# Patient Record
Sex: Female | Born: 1990
Health system: Southern US, Community
[De-identification: ages and names within clinical notes are randomized; demographics above are authoritative.]

## PROBLEM LIST (undated history)

## (undated) ENCOUNTER — Inpatient Hospital Stay: Payer: Self-pay

## (undated) ENCOUNTER — Inpatient Hospital Stay (HOSPITAL_COMMUNITY): Payer: Self-pay

## (undated) DIAGNOSIS — J45909 Unspecified asthma, uncomplicated: Secondary | ICD-10-CM

## (undated) DIAGNOSIS — D649 Anemia, unspecified: Secondary | ICD-10-CM

## (undated) HISTORY — PX: WISDOM TOOTH EXTRACTION: SHX21

## (undated) HISTORY — DX: Unspecified asthma, uncomplicated: J45.909

## (undated) HISTORY — PX: OTHER SURGICAL HISTORY: SHX169

---
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
## 2016-05-02 LAB — HM PAP SMEAR: HM Pap smear: NEGATIVE

## 2017-01-13 ENCOUNTER — Ambulatory Visit (INDEPENDENT_AMBULATORY_CARE_PROVIDER_SITE_OTHER): Payer: Self-pay | Admitting: Certified Nurse Midwife

## 2017-01-13 ENCOUNTER — Other Ambulatory Visit (INDEPENDENT_AMBULATORY_CARE_PROVIDER_SITE_OTHER): Payer: Medicaid Other

## 2017-01-13 ENCOUNTER — Other Ambulatory Visit: Payer: Self-pay | Admitting: Certified Nurse Midwife

## 2017-01-13 VITALS — BP 93/66 | HR 79 | Ht 60.0 in | Wt 223.6 lb

## 2017-01-13 DIAGNOSIS — Z8742 Personal history of other diseases of the female genital tract: Secondary | ICD-10-CM

## 2017-01-13 DIAGNOSIS — Z113 Encounter for screening for infections with a predominantly sexual mode of transmission: Secondary | ICD-10-CM

## 2017-01-13 DIAGNOSIS — Z3401 Encounter for supervision of normal first pregnancy, first trimester: Secondary | ICD-10-CM

## 2017-01-13 DIAGNOSIS — Z1389 Encounter for screening for other disorder: Secondary | ICD-10-CM

## 2017-01-13 NOTE — Progress Notes (Signed)
Natalie Beck presents for NOB nurse interview visit. Pregnancy confirmation done at [facility] on 12/19/2016. UPT-positive. LMP: 11/07/2016 (exact). Does have history of irregular menses. No vaginal spotting or significant pain. Ultrasound for dating and viability ordered.   G-1.  P-0000. Pregnancy education material explained and given. No cats in the home. NOB labs ordered. TSH/HbgA1c due to Increased BMI of 43.  HIV labs were explained optional and she did not decline. Drug screen declined. Pt states she has been around it. PNV encouraged. Will take Vitamin B6,  3x day for nausea and Unisom if needed. Genetic screening options discussed. Genetic testing: Unsure.  Pt may discuss with provider. Pt. To follow up with provider on 02/04/2017 for NOB physical.  All questions answered.

## 2017-01-13 NOTE — Patient Instructions (Signed)
Pregnancy and Zika Virus Disease Zika virus disease, or Zika, is an illness that can spread to people from mosquitoes that carry the virus. It may also spread from person to person through infected body fluids. Zika first occurred in [place], but recently it has spread to new areas. The virus occurs in tropical climates. The location of Zika continues to change. Most people who become infected with Zika virus do not develop serious illness. However, Zika may cause birth defects in an unborn baby whose mother is infected with the virus. It may also increase the risk of miscarriage. What are the symptoms of Zika virus disease? In many cases, people who have been infected with Zika virus do not develop any symptoms. If symptoms appear, they usually start about a week after the person is infected. Symptoms are usually mild. They may include:  Fever.  Rash.  Red eyes.  Joint pain.  How does Zika virus disease spread? The main way that Zika virus spreads is through the bite of a certain type of mosquito. Unlike most types of mosquitos, which bite only at night, the type of mosquito that carries Zika virus bites both at night and during the day. Zika virus can also spread through sexual contact, through a blood transfusion, and from a mother to her baby before or during birth. Once you have had Zika virus disease, it is unlikely that you will get it again. Can I pass Zika to my baby during pregnancy? Yes, Zika can pass from a mother to her baby before or during birth. What problems can Zika cause for my baby? A woman who is infected with Zika virus while pregnant is at risk of having her baby born with a condition in which the brain or head is smaller than expected (microcephaly). Babies who have microcephaly can have developmental delays, seizures, hearing problems, and vision problems. Having Zika virus disease during pregnancy can also increase the risk of miscarriage. How can Zika virus disease be  prevented? There is no vaccine to prevent Zika. The best way to prevent the disease is to avoid infected mosquitoes and avoid exposure to body fluids that can spread the virus. Avoid any possible exposure to Zika by taking the following precautions. For women and their sex partners:  Avoid traveling to high-risk areas. The locations where Zika is being reported change often. To identify high-risk areas, check the CDC travel website: www.cdc.gov/zika/geo/index.html  If you or your sex partner must travel to a high-risk area, talk with a health care provider before and after traveling.  Take all precautions to avoid mosquito bites if you live in, or travel to, any of the high-risk areas. Insect repellents are safe to use during pregnancy.  Ask your health care provider when it is safe to have sexual contact.  For women:  If you are pregnant or trying to become pregnant, avoid sexual contact with persons who may have been exposed to Zika virus, persons who have possible symptoms of Zika, or persons whose history you are unsure about. If you choose to have sexual contact with someone who may have been exposed to Zika virus, use condoms correctly during the entire duration of sexual activity, every time. Do not share sexual devices, as you may be exposed to body fluids.  Ask your health care provider about when it is safe to attempt pregnancy after a possible exposure to Zika virus.  What steps should I take to avoid mosquito bites? Take these steps to avoid mosquito bites   when you are in a high-risk area:  Wear loose clothing that covers your arms and legs.  Limit your outdoor activities.  Do not open windows unless they have window screens.  Sleep under mosquito nets.  Use insect repellent. The best insect repellents have:  DEET, picaridin, oil of lemon eucalyptus (OLE), or IR3535 in them.  Higher amounts of an active ingredient in them.  Remember that insect repellents are safe to  use during pregnancy.  Do not use OLE on children who are younger than 3 years of age. Do not use insect repellent on babies who are younger than 2 months of age.  Cover your child's stroller with mosquito netting. Make sure the netting fits snugly and that any loose netting does not cover your child's mouth or nose. Do not use a blanket as a mosquito-protection cover.  Do not apply insect repellent underneath clothing.  If you are using sunscreen, apply the sunscreen before applying the insect repellent.  Treat clothing with permethrin. Do not apply permethrin directly to your skin. Follow label directions for safe use.  Get rid of standing water, where mosquitoes may reproduce. Standing water is often found in items such as buckets, bowls, animal food dishes, and flowerpots.  When you return from traveling to any high-risk area, continue taking actions to protect yourself against mosquito bites for 3 weeks, even if you show no signs of illness. This will prevent spreading Zika virus to uninfected mosquitoes. What should I know about the sexual transmission of Zika? People can spread Zika to their sexual partners during vaginal, anal, or oral sex, or by sharing sexual devices. Many people with Zika do not develop symptoms, so a person could spread the disease without knowing that they are infected. The greatest risk is to women who are pregnant or who may become pregnant. Zika virus can live longer in semen than it can live in blood. Couples can prevent sexual transmission of the virus by:  Using condoms correctly during the entire duration of sexual activity, every time. This includes vaginal, anal, and oral sex.  Not sharing sexual devices. Sharing increases your risk of being exposed to body fluid from another person.  Avoiding all sexual activity until your health care provider says it is safe.  Should I be tested for Zika virus? A sample of your blood can be tested for Zika virus. A  pregnant woman should be tested if she may have been exposed to the virus or if she has symptoms of Zika. She may also have additional tests done during her pregnancy, such ultrasound testing. Talk with your health care provider about which tests are recommended. This information is not intended to replace advice given to you by your health care provider. Make sure you discuss any questions you have with your health care provider. Document Released: 12/28/2014 Document Revised: 09/14/2015 Document Reviewed: 12/21/2014 Elsevier Interactive Patient Education  2018 [employer] Hyperemesis Gravidarum Hyperemesis gravidarum is a severe form of nausea and vomiting that happens during pregnancy. Hyperemesis is worse than morning sickness. It may cause you to have nausea or vomiting all day for many days. It may keep you from eating and drinking enough food and liquids. Hyperemesis usually occurs during the first half (the first 20 weeks) of pregnancy. It often goes away once a woman is in her second half of pregnancy. However, sometimes hyperemesis continues through an entire pregnancy. What are the causes? The cause of this condition is not known. It may be related   to changes in chemicals (hormones) in the body during pregnancy, such as the high level of pregnancy hormone (human chorionic gonadotropin) or the increase in the female sex hormone (estrogen). What are the signs or symptoms? Symptoms of this condition include:  Severe nausea and vomiting.  Nausea that does not go away.  Vomiting that does not allow you to keep any food down.  Weight loss.  Body fluid loss (dehydration).  Having no desire to eat, or not liking food that you have previously enjoyed.  How is this diagnosed? This condition may be diagnosed based on:  A physical exam.  Your medical history.  Your symptoms.  Blood tests.  Urine tests.  How is this treated? This condition may be managed with medicine. If  medicines to do not help relieve nausea and vomiting, you may need to receive fluids through an IV tube at the hospital. Follow these instructions at home:  Take over-the-counter and prescription medicines only as told by your health care provider.  Avoid iron pills and multivitamins that contain iron for the first 3-4 months of pregnancy. If you take prescription iron pills, do not stop taking them unless your health care provider approves.  Take the following actions to help prevent nausea and vomiting: ? In the morning, before getting out of bed, try eating a couple of dry crackers or a piece of toast. ? Avoid foods and smells that upset your stomach. Fatty and spicy foods may make nausea worse. ? Eat 5-6 small meals a day. ? Do not drink fluids while eating meals. Drink between meals. ? Eat or suck on things that have ginger in them. Ginger can help relieve nausea. ? Avoid food preparation. The smell of food can spoil your appetite or trigger nausea.  Follow instructions from your health care provider about eating or drinking restrictions.  For snacks, eat high-protein foods, such as cheese.  Keep all follow-up and pre-birth (prenatal) visits as told by your health care provider. This is important. Contact a health care provider if:  You have pain in your abdomen.  You have a severe headache.  You have vision problems.  You are losing weight. Get help right away if:  You cannot drink fluids without vomiting.  You vomit blood.  You have constant nausea and vomiting.  You are very weak.  You are very thirsty.  You feel dizzy.  You faint.  You have a fever or other symptoms that last for more than 2-3 days.  You have a fever and your symptoms suddenly get worse. Summary  Hyperemesis gravidarum is a severe form of nausea and vomiting that happens during pregnancy.  Making some changes to your eating habits may help relieve nausea and vomiting.  This condition may  be managed with medicine.  If medicines to do not help relieve nausea and vomiting, you may need to receive fluids through an IV tube at the hospital. This information is not intended to replace advice given to you by your health care provider. Make sure you discuss any questions you have with your health care provider. Document Released: 04/08/2005 Document Revised: 12/06/2015 Document Reviewed: 12/06/2015 Elsevier Interactive Patient Education  2017 [employer] First Trimester of Pregnancy The first trimester of pregnancy is from week 1 until the end of week 13 (months 1 through 3). During this time, your baby will begin to develop inside you. At 6-8 weeks, the eyes and face are formed, and the heartbeat can be seen on ultrasound. At the   end of 12 weeks, all the baby's organs are formed. Prenatal care is all the medical care you receive before the birth of your baby. Make sure you get good prenatal care and follow all of your doctor's instructions. Follow these instructions at home: Medicines  Take over-the-counter and prescription medicines only as told by your doctor. Some medicines are safe and some medicines are not safe during pregnancy.  Take a prenatal vitamin that contains at least 600 micrograms (mcg) of folic acid.  If you have trouble pooping (constipation), take medicine that will make your stool soft (stool softener) if your doctor approves. Eating and drinking  Eat regular, healthy meals.  Your doctor will tell you the amount of weight gain that is right for you.  Avoid raw meat and uncooked cheese.  If you feel sick to your stomach (nauseous) or throw up (vomit): ? Eat 4 or 5 small meals a day instead of 3 large meals. ? Try eating a few soda crackers. ? Drink liquids between meals instead of during meals.  To prevent constipation: ? Eat foods that are high in fiber, like fresh fruits and vegetables, whole grains, and beans. ? Drink enough fluids to keep your pee  (urine) clear or pale yellow. Activity  Exercise only as told by your doctor. Stop exercising if you have cramps or pain in your lower belly (abdomen) or low back.  Do not exercise if it is too hot, too humid, or if you are in a place of great height (high altitude).  Try to avoid standing for long periods of time. Move your legs often if you must stand in one place for a long time.  Avoid heavy lifting.  Wear low-heeled shoes. Sit and stand up straight.  You can have sex unless your doctor tells you not to. Relieving pain and discomfort  Wear a good support bra if your breasts are sore.  Take warm water baths (sitz baths) to soothe pain or discomfort caused by hemorrhoids. Use hemorrhoid cream if your doctor says it is okay.  Rest with your legs raised if you have leg cramps or low back pain.  If you have puffy, bulging veins (varicose veins) in your legs: ? Wear support hose or compression stockings as told by your doctor. ? Raise (elevate) your feet for 15 minutes, 3-4 times a day. ? Limit salt in your food. Prenatal care  Schedule your prenatal visits by the twelfth week of pregnancy.  Write down your questions. Take them to your prenatal visits.  Keep all your prenatal visits as told by your doctor. This is important. Safety  Wear your seat belt at all times when driving.  Make a list of emergency phone numbers. The list should include numbers for family, friends, the hospital, and police and fire departments. General instructions  Ask your doctor for a referral to a local prenatal class. Begin classes no later than at the start of month 6 of your pregnancy.  Ask for help if you need counseling or if you need help with nutrition. Your doctor can give you advice or tell you where to go for help.  Do not use hot tubs, steam rooms, or saunas.  Do not douche or use tampons or scented sanitary pads.  Do not cross your legs for long periods of time.  Avoid all herbs  and alcohol. Avoid drugs that are not approved by your doctor.  Do not use any tobacco products, including cigarettes, chewing tobacco, and electronic cigarettes.   If you need help quitting, ask your doctor. You may get counseling or other support to help you quit.  Avoid cat litter boxes and soil used by cats. These carry germs that can cause birth defects in the baby and can cause a loss of your baby (miscarriage) or stillbirth.  Visit your dentist. At home, brush your teeth with a soft toothbrush. Be gentle when you floss. Contact a doctor if:  You are dizzy.  You have mild cramps or pressure in your lower belly.  You have a nagging pain in your belly area.  You continue to feel sick to your stomach, you throw up, or you have watery poop (diarrhea).  You have a bad smelling fluid coming from your vagina.  You have pain when you pee (urinate).  You have increased puffiness (swelling) in your face, hands, legs, or ankles. Get help right away if:  You have a fever.  You are leaking fluid from your vagina.  You have spotting or bleeding from your vagina.  You have very bad belly cramping or pain.  You gain or lose weight rapidly.  You throw up blood. It may look like coffee grounds.  You are around people who have German measles, fifth disease, or chickenpox.  You have a very bad headache.  You have shortness of breath.  You have any kind of trauma, such as from a fall or a car accident. Summary  The first trimester of pregnancy is from week 1 until the end of week 13 (months 1 through 3).  To take care of yourself and your unborn baby, you will need to eat healthy meals, take medicines only if your doctor tells you to do so, and do activities that are safe for you and your baby.  Keep all follow-up visits as told by your doctor. This is important as your doctor will have to ensure that your baby is healthy and growing well. This information is not intended to replace  advice given to you by your health care provider. Make sure you discuss any questions you have with your health care provider. Document Released: 09/25/2007 Document Revised: 04/16/2016 Document Reviewed: 04/16/2016 Elsevier Interactive Patient Education  2017 [employer] Commonly Asked Questions During Pregnancy  Cats: A parasite can be excreted in cat feces.  To avoid exposure you need to have another person empty the little box.  If you must empty the litter box you will need to wear gloves.  Wash your hands after handling your cat.  This parasite can also be found in raw or undercooked meat so this should also be avoided.  Colds, Sore Throats, Flu: Please check your medication sheet to see what you can take for symptoms.  If your symptoms are unrelieved by these medications please call the office.  Dental Work: Most any dental work your dentist recommends is permitted.  X-rays should only be taken during the first trimester if absolutely necessary.  Your abdomen should be shielded with a lead apron during all x-rays.  Please notify your provider prior to receiving any x-rays.  Novocaine is fine; gas is not recommended.  If your dentist requires a note from us prior to dental work please call the office and we will provide one for you.  Exercise: Exercise is an important part of staying healthy during your pregnancy.  You may continue most exercises you were accustomed to prior to pregnancy.  Later in your pregnancy you will most likely notice you have difficulty with activities   requiring balance like riding a bicycle.  It is important that you listen to your body and avoid activities that put you at a higher risk of falling.  Adequate rest and staying well hydrated are a must!  If you have questions about the safety of specific activities ask your provider.    Exposure to Children with illness: Try to avoid obvious exposure; report any symptoms to us when noted,  If you have chicken pos, red  measles or mumps, you should be immune to these diseases.   Please do not take any vaccines while pregnant unless you have checked with your OB provider.  Fetal Movement: After 28 weeks we recommend you do "kick counts" twice daily.  Lie or sit down in a calm quiet environment and count your baby movements "kicks".  You should feel your baby at least 10 times per hour.  If you have not felt 10 kicks within the first hour get up, walk around and have something sweet to eat or drink then repeat for an additional hour.  If count remains less than 10 per hour notify your provider.  Fumigating: Follow your pest control agent's advice as to how long to stay out of your home.  Ventilate the area well before re-entering.  Hemorrhoids:   Most over-the-counter preparations can be used during pregnancy.  Check your medication to see what is safe to use.  It is important to use a stool softener or fiber in your diet and to drink lots of liquids.  If hemorrhoids seem to be getting worse please call the office.   Hot Tubs:  Hot tubs Jacuzzis and saunas are not recommended while pregnant.  These increase your internal body temperature and should be avoided.  Intercourse:  Sexual intercourse is safe during pregnancy as long as you are comfortable, unless otherwise advised by your provider.  Spotting may occur after intercourse; report any bright red bleeding that is heavier than spotting.  Labor:  If you know that you are in labor, please go to the hospital.  If you are unsure, please call the office and let us help you decide what to do.  Lifting, straining, etc:  If your job requires heavy lifting or straining please check with your provider for any limitations.  Generally, you should not lift items heavier than that you can lift simply with your hands and arms (no back muscles)  Painting:  Paint fumes do not harm your pregnancy, but may make you ill and should be avoided if possible.  Latex or water based paints  have less odor than oils.  Use adequate ventilation while painting.  Permanents & Hair Color:  Chemicals in hair dyes are not recommended as they cause increase hair dryness which can increase hair loss during pregnancy.  " Highlighting" and permanents are allowed.  Dye may be absorbed differently and permanents may not hold as well during pregnancy.  Sunbathing:  Use a sunscreen, as skin burns easily during pregnancy.  Drink plenty of fluids; avoid over heating.  Tanning Beds:  Because their possible side effects are still unknown, tanning beds are not recommended.  Ultrasound Scans:  Routine ultrasounds are performed at approximately 20 weeks.  You will be able to see your baby's general anatomy an if you would like to know the gender this can usually be determined as well.  If it is questionable when you conceived you may also receive an ultrasound early in your pregnancy for dating purposes.  Otherwise ultrasound exams   are not routinely performed unless there is a medical necessity.  Although you can request a scan we ask that you pay for it when conducted because insurance does not cover " patient request" scans.  Work: If your pregnancy proceeds without complications you may work until your due date, unless your physician or employer advises otherwise.  Round Ligament Pain/Pelvic Discomfort:  Sharp, shooting pains not associated with bleeding are fairly common, usually occurring in the second trimester of pregnancy.  They tend to be worse when standing up or when you remain standing for long periods of time.  These are the result of pressure of certain pelvic ligaments called "round ligaments".  Rest, Tylenol and heat seem to be the most effective relief.  As the womb and fetus grow, they rise out of the pelvis and the discomfort improves.  Please notify the office if your pain seems different than that described.  It may represent a more serious condition.   

## 2017-01-14 LAB — CBC WITH DIFFERENTIAL/PLATELET
Basophils Absolute: 0 10*3/uL (ref 0.0–0.2)
Basos: 0 %
EOS (ABSOLUTE): 0.1 10*3/uL (ref 0.0–0.4)
Eos: 1 %
Hematocrit: 34.4 % (ref 34.0–46.6)
Hemoglobin: 10.7 g/dL — ABNORMAL LOW (ref 11.1–15.9)
Immature Grans (Abs): 0 10*3/uL (ref 0.0–0.1)
Immature Granulocytes: 0 %
Lymphocytes Absolute: 3.2 10*3/uL — ABNORMAL HIGH (ref 0.7–3.1)
Lymphs: 33 %
MCH: 25.2 pg — ABNORMAL LOW (ref 26.6–33.0)
MCHC: 31.1 g/dL — ABNORMAL LOW (ref 31.5–35.7)
MCV: 81 fL (ref 79–97)
Monocytes Absolute: 0.6 10*3/uL (ref 0.1–0.9)
Monocytes: 7 %
Neutrophils Absolute: 5.7 10*3/uL (ref 1.4–7.0)
Neutrophils: 59 %
Platelets: 265 10*3/uL (ref 150–379)
RBC: 4.24 x10E6/uL (ref 3.77–5.28)
RDW: 18 % — ABNORMAL HIGH (ref 12.3–15.4)
WBC: 9.7 10*3/uL (ref 3.4–10.8)

## 2017-01-14 LAB — URINALYSIS, ROUTINE W REFLEX MICROSCOPIC
Bilirubin, UA: NEGATIVE
Glucose, UA: NEGATIVE
Ketones, UA: NEGATIVE
Nitrite, UA: NEGATIVE
PH UA: 6 (ref 5.0–7.5)
PROTEIN UA: NEGATIVE
RBC, UA: NEGATIVE
Specific Gravity, UA: 1.022 (ref 1.005–1.030)
Urobilinogen, Ur: 0.2 mg/dL (ref 0.2–1.0)

## 2017-01-14 LAB — VARICELLA ZOSTER ANTIBODY, IGG: VARICELLA: 1307 {index} (ref >165)

## 2017-01-14 LAB — HEPATITIS B SURFACE ANTIGEN: Hepatitis B Surface Ag: NEGATIVE

## 2017-01-14 LAB — MICROSCOPIC EXAMINATION: Casts: NONE SEEN /lpf

## 2017-01-14 LAB — RUBELLA SCREEN: Rubella Antibodies, IGG: 4.27 index (ref >0.99)

## 2017-01-14 LAB — ABO

## 2017-01-14 LAB — TSH: TSH: 1.79 u[IU]/mL (ref 0.450–4.500)

## 2017-01-14 LAB — RPR: RPR: NONREACTIVE

## 2017-01-14 LAB — ANTIBODY SCREEN: Antibody Screen: NEGATIVE

## 2017-01-14 LAB — SICKLE CELL SCREEN: Sickle Cell Screen: NEGATIVE

## 2017-01-14 LAB — RH TYPE: RH TYPE: POSITIVE

## 2017-01-14 LAB — HEMOGLOBIN A1C
ESTIMATED AVERAGE GLUCOSE: 108 mg/dL
Hgb A1c MFr Bld: 5.4 % (ref 4.8–5.6)

## 2017-01-14 LAB — HIV ANTIBODY (ROUTINE TESTING W REFLEX): HIV Screen 4th Generation wRfx: NONREACTIVE

## 2017-01-17 ENCOUNTER — Telehealth: Payer: Self-pay

## 2017-01-17 NOTE — Telephone Encounter (Signed)
-----   Message from Gunnar Bulla, CNM sent at 01/17/2017  2:25 PM EDT ----- Please contact patient. H/H low, needs oral iron supplementation daily. May take 325 mg PO ferrous gluconate or ferrous sulfate. Otherwise labs normal for pregnancy. Encourage to activate MyChart. Thanks, JML

## 2017-01-17 NOTE — Telephone Encounter (Signed)
Message left on voicemail of providers instructions and encouraged her to set up mychart on next visit.

## 2017-01-17 NOTE — Progress Notes (Signed)
Please contact patient. H/H low, needs oral iron supplementation daily. May take 325 mg PO ferrous gluconate or ferrous sulfate. Otherwise labs normal for pregnancy. Encourage to activate MyChart. Thanks, JML

## 2017-01-18 LAB — URINE CULTURE, OB REFLEX

## 2017-01-18 LAB — CULTURE, OB URINE

## 2017-01-20 ENCOUNTER — Encounter: Payer: Self-pay | Admitting: Certified Nurse Midwife

## 2017-01-20 DIAGNOSIS — B951 Streptococcus, group B, as the cause of diseases classified elsewhere: Secondary | ICD-10-CM | POA: Insufficient documentation

## 2017-01-20 LAB — GC/CHLAMYDIA PROBE AMP
Chlamydia trachomatis, NAA: NEGATIVE
Neisseria gonorrhoeae by PCR: NEGATIVE

## 2017-02-04 ENCOUNTER — Ambulatory Visit (INDEPENDENT_AMBULATORY_CARE_PROVIDER_SITE_OTHER): Payer: Medicaid Other | Admitting: Certified Nurse Midwife

## 2017-02-04 VITALS — BP 122/69 | HR 81 | Wt 222.5 lb

## 2017-02-04 DIAGNOSIS — Z3492 Encounter for supervision of normal pregnancy, unspecified, second trimester: Secondary | ICD-10-CM

## 2017-02-04 DIAGNOSIS — Z6841 Body Mass Index (BMI) 40.0 and over, adult: Secondary | ICD-10-CM

## 2017-02-04 DIAGNOSIS — B951 Streptococcus, group B, as the cause of diseases classified elsewhere: Secondary | ICD-10-CM

## 2017-02-04 LAB — POCT URINALYSIS DIPSTICK
Bilirubin, UA: NEGATIVE
Blood, UA: NEGATIVE
GLUCOSE UA: NEGATIVE
Ketones, UA: NEGATIVE
LEUKOCYTES UA: NEGATIVE
NITRITE UA: NEGATIVE
SPEC GRAV UA: 1.025 (ref 1.010–1.025)
UROBILINOGEN UA: 0.2 U/dL
pH, UA: 6 (ref 5.0–8.0)

## 2017-02-04 MED ORDER — TERCONAZOLE 0.4 % VA CREA
1.0000 | TOPICAL_CREAM | Freq: Every day | VAGINAL | 0 refills | Status: DC
Start: 2017-02-04 — End: 2017-03-05

## 2017-02-04 NOTE — Patient Instructions (Signed)
Eating Plan for Pregnant Women While you are pregnant, your body will require additional nutrition to help support your growing baby. It is recommended that you consume:  150 additional calories each day during your first trimester.  300 additional calories each day during your second trimester.  300 additional calories each day during your third trimester.  Eating a healthy, well-balanced diet is very important for your health and for your baby's health. You also have a higher need for some vitamins and minerals, such as folic acid, calcium, iron, and vitamin D. What do I need to know about eating during pregnancy?  Do not try to lose weight or go on a diet during pregnancy.  Choose healthy, nutritious foods. Choose  of a sandwich with a glass of milk instead of a candy bar or a high-calorie sugar-sweetened beverage.  Limit your overall intake of foods that have "empty calories." These are foods that have little nutritional value, such as sweets, desserts, candies, sugar-sweetened beverages, and fried foods.  Eat a variety of foods, especially fruits and vegetables.  Take a prenatal vitamin to help meet the additional needs during pregnancy, specifically for folic acid, iron, calcium, and vitamin D.  Remember to stay active. Ask your health care provider for exercise recommendations that are specific to you.  Practice good food safety and cleanliness, such as washing your hands before you eat and after you prepare raw meat. This helps to prevent foodborne illnesses, such as listeriosis, that can be very dangerous for your baby. Ask your health care provider for more information about listeriosis. What does 150 extra calories look like? Healthy options for an additional 150 calories each day could be any of the following:  Plain low-fat yogurt (6-8 oz) with  cup of berries.  1 apple with 2 teaspoons of peanut butter.  Cut-up vegetables with  cup of hummus.  Low-fat chocolate milk  (8 oz or 1 cup).  1 string cheese with 1 medium orange.   of a peanut butter and jelly sandwich on whole-wheat bread (1 tsp of peanut butter).  For 300 calories, you could eat two of those healthy options each day. What is a healthy amount of weight to gain? The recommended amount of weight for you to gain is based on your pre-pregnancy BMI. If your pre-pregnancy BMI was:  Less than 18 (underweight), you should gain 28-40 lb.  18-24.9 (normal), you should gain 25-35 lb.  25-29.9 (overweight), you should gain 15-25 lb.  Greater than 30 (obese), you should gain 11-20 lb.  What if I am having twins or multiples? Generally, pregnant women who will be having twins or multiples may need to increase their daily calories by 300-600 calories each day. The recommended range for total weight gain is 25-54 lb, depending on your pre-pregnancy BMI. Talk with your health care provider for specific guidance about additional nutritional needs, weight gain, and exercise during your pregnancy. What foods can I eat? Grains Any grains. Try to choose whole grains, such as whole-wheat bread, oatmeal, or brown rice. Vegetables Any vegetables. Try to eat a variety of colors and types of vegetables to get a full range of vitamins and minerals. Remember to wash your vegetables well before eating. Fruits Any fruits. Try to eat a variety of colors and types of fruit to get a full range of vitamins and minerals. Remember to wash your fruits well before eating. Meats and Other Protein Sources Lean meats, including chicken, [place], fish, and lean cuts of beef, veal,  or pork. Make sure that all meats are cooked to "well done." Tofu. Tempeh. Beans. Eggs. Peanut butter and other nut butters. Seafood, such as shrimp, crab, and lobster. If you choose fish, select types that are higher in omega-3 fatty acids, including salmon, herring, mussels, trout, sardines, and pollock. Make sure that all meats are cooked to food-safe  temperatures. Dairy Pasteurized milk and milk alternatives. Pasteurized yogurt and pasteurized cheese. Cottage cheese. Sour cream. Beverages Water. Juices that contain 100% fruit juice or vegetable juice. Caffeine-free teas and decaffeinated coffee. Drinks that contain caffeine are okay to drink, but it is better to avoid caffeine. Keep your total caffeine intake to less than 200 mg each day (12 oz of coffee, tea, or soda) or as directed by your health care provider. Condiments Any pasteurized condiments. Sweets and Desserts Any sweets and desserts. Fats and Oils Any fats and oils. The items listed above may not be a complete list of recommended foods or beverages. Contact your dietitian for more options. What foods are not recommended? Vegetables Unpasteurized (raw) vegetable juices. Fruits Unpasteurized (raw) fruit juices. Meats and Other Protein Sources Cured meats that have nitrates, such as bacon, salami, and hotdogs. Luncheon meats, bologna, or other deli meats (unless they are reheated until they are steaming hot). Refrigerated pate, meat spreads from a meat counter, smoked seafood that is found in the refrigerated section of a store. Raw fish, such as sushi or sashimi. High mercury content fish, such as tilefish, shark, swordfish, and king mackerel. Raw meats, such as tuna or beef tartare. Undercooked meats and poultry. Make sure that all meats are cooked to food-safe temperatures. Dairy Unpasteurized (raw) milk and any foods that have raw milk in them. Soft cheeses, such as feta, queso blanco, queso fresco, Brie, Camembert cheeses, blue-veined cheeses, and Panela cheese (unless it is made with pasteurized milk, which must be stated on the label). Beverages Alcohol. Sugar-sweetened beverages, such as sodas, teas, or energy drinks. Condiments Homemade fermented foods and drinks, such as pickles, sauerkraut, or kombucha drinks. (Store-bought pasteurized versions of these are  okay.) Other Salads that are made in the store, such as ham salad, chicken salad, egg salad, tuna salad, and seafood salad. The items listed above may not be a complete list of foods and beverages to avoid. Contact your dietitian for more information. This information is not intended to replace advice given to you by your health care provider. Make sure you discuss any questions you have with your health care provider. Document Released: 01/21/2014 Document Revised: 09/14/2015 Document Reviewed: 09/21/2013 Elsevier Interactive Patient Education  2018 [employer]. Morning Sickness Morning sickness is when you feel sick to your stomach (nauseous) during pregnancy. You may feel sick to your stomach and throw up (vomit). You may feel sick in the morning, but you can feel this way any time of day. Some women feel very sick to their stomach and cannot stop throwing up (hyperemesis gravidarum). Follow these instructions at home:  Only take medicines as told by your doctor.  Take multivitamins as told by your doctor. Taking multivitamins before getting pregnant can stop or lessen the harshness of morning sickness.  Eat dry toast or unsalted crackers before getting out of bed.  Eat 5 to 6 small meals a day.  Eat dry and bland foods like rice and baked potatoes.  Do not drink liquids with meals. Drink between meals.  Do not eat greasy, fatty, or spicy foods.  Have someone cook for you if the smell  of food causes you to feel sick or throw up.  If you feel sick to your stomach after taking prenatal vitamins, take them at night or with a snack.  Eat protein when you need a snack (nuts, yogurt, cheese).  Eat unsweetened gelatins for dessert.  Wear a bracelet used for sea sickness (acupressure wristband).  Go to a doctor that puts thin needles into certain body points (acupuncture) to improve how you feel.  Do not smoke.  Use a humidifier to keep the air in your house free of odors.  Get  lots of fresh air. Contact a doctor if:  You need medicine to feel better.  You feel dizzy or lightheaded.  You are losing weight. Get help right away if:  You feel very sick to your stomach and cannot stop throwing up.  You pass out (faint). This information is not intended to replace advice given to you by your health care provider. Make sure you discuss any questions you have with your health care provider. Document Released: 05/16/2004 Document Revised: 09/14/2015 Document Reviewed: 09/23/2012 Elsevier Interactive Patient Education  2017 [employer]. Common Medications Safe in Pregnancy  Acne:      Constipation:  Benzoyl Peroxide     Colace  Clindamycin      Dulcolax Suppository  Topica Erythromycin     Fibercon  Salicylic Acid      Metamucil         Miralax AVOID:        Senakot   Accutane    Cough:  Retin-A       Cough Drops  Tetracycline      Phenergan w/ Codeine if Rx  Minocycline      Robitussin (Plain & DM)  Antibiotics:     Crabs/Lice:  Ceclor       RID  Cephalosporins    AVOID:  E-Mycins      Kwell  Keflex  Macrobid/Macrodantin   Diarrhea:  Penicillin      Kao-Pectate  Zithromax      Imodium AD         PUSH FLUIDS AVOID:       Cipro     Fever:  Tetracycline      Tylenol (Regular or Extra  Minocycline       Strength)  Levaquin      Extra Strength-Do not          Exceed 8 tabs/24 hrs Caffeine:        <281m/day (equiv. To 1 cup of coffee or  approx. 3 12 oz sodas)         Gas: Cold/Hayfever:       Gas-X  Benadryl      Mylicon  Claritin       Phazyme  **Claritin-D        Chlor-Trimeton    Headaches:  Dimetapp      ASA-Free Excedrin  Drixoral-Non-Drowsy     Cold Compress  Mucinex (Guaifenasin)     Tylenol (Regular or Extra  Sudafed/Sudafed-12 Hour     Strength)  **Sudafed PE Pseudoephedrine   Tylenol Cold & Sinus     Vicks Vapor Rub  Zyrtec  **AVOID if Problems With Blood Pressure         Heartburn: Avoid lying down for at least 1 hour  after meals  Aciphex      Maalox     Rash:  Milk of Magnesia     Benadryl    Mylanta  1% Hydrocortisone Cream  Pepcid  Pepcid Complete   Sleep Aids:  Prevacid      Ambien   Prilosec       Benadryl  Rolaids       Chamomile Tea  Tums (Limit 4/day)     Unisom  Zantac       Tylenol PM         Warm milk-add vanilla or  Hemorrhoids:       Sugar for taste  Anusol/Anusol H.C.  (RX: Analapram 2.5%)  Sugar Substitutes:  Hydrocortisone OTC     Ok in moderation  Preparation H      Tucks        Vaseline lotion applied to tissue with wiping    Herpes:     Throat:  Acyclovir      Oragel  Famvir  Valtrex     Vaccines:         Flu Shot Leg Cramps:       *Gardasil  Benadryl      Hepatitis A         Hepatitis B Nasal Spray:       Pneumovax  Saline Nasal Spray     Polio Booster         Tetanus Nausea:       Tuberculosis test or PPD  Vitamin B6 25 mg TID   AVOID:    Dramamine      *Gardasil  Emetrol       Live Poliovirus  Ginger Root 250 mg QID    MMR (measles, mumps &  High Complex Carbs @ Bedtime    rebella)  Sea Bands-Accupressure    Varicella (Chickenpox)  Unisom 1/2 tab TID     *No known complications           If received before Pain:         Known pregnancy;   Darvocet       Resume series after  Lortab        Delivery  Percocet    Yeast:   Tramadol      Femstat  Tylenol 3      Gyne-lotrimin  Ultram       Monistat  Vicodin           MISC:         All Sunscreens           Hair Coloring/highlights          Insect Repellant's          (Including DEET)         Mystic Tans Second Trimester of Pregnancy The second trimester is from week 14 through week 27 (months 4 through 6). The second trimester is often a time when you feel your best. Your body has adjusted to being pregnant, and you begin to feel better physically. Usually, morning sickness has lessened or quit completely, you may have more energy, and you may have an increase in appetite. The second trimester is also a  time when the fetus is growing rapidly. At the end of the sixth month, the fetus is about 9 inches long and weighs about 1 pounds. You will likely begin to feel the baby move (quickening) between 16 and 20 weeks of pregnancy. Body changes during your second trimester Your body continues to go through many changes during your second trimester. The changes vary from woman to woman.  Your weight will continue to increase. You will notice your lower abdomen bulging  out.  You may begin to get stretch marks on your hips, abdomen, and breasts.  You may develop headaches that can be relieved by medicines. The medicines should be approved by your health care provider.  You may urinate more often because the fetus is pressing on your bladder.  You may develop or continue to have heartburn as a result of your pregnancy.  You may develop constipation because certain hormones are causing the muscles that push waste through your intestines to slow down.  You may develop hemorrhoids or swollen, bulging veins (varicose veins).  You may have back pain. This is caused by: ? Weight gain. ? Pregnancy hormones that are relaxing the joints in your pelvis. ? A shift in weight and the muscles that support your balance.  Your breasts will continue to grow and they will continue to become tender.  Your gums may bleed and may be sensitive to brushing and flossing.  Dark spots or blotches (chloasma, mask of pregnancy) may develop on your face. This will likely fade after the baby is born.  A dark line from your belly button to the pubic area (linea nigra) may appear. This will likely fade after the baby is born.  You may have changes in your hair. These can include thickening of your hair, rapid growth, and changes in texture. Some women also have hair loss during or after pregnancy, or hair that feels dry or thin. Your hair will most likely return to normal after your baby is born.  What to expect at prenatal  visits During a routine prenatal visit:  You will be weighed to make sure you and the fetus are growing normally.  Your blood pressure will be taken.  Your abdomen will be measured to track your baby's growth.  The fetal heartbeat will be listened to.  Any test results from the previous visit will be discussed.  Your health care provider may ask you:  How you are feeling.  If you are feeling the baby move.  If you have had any abnormal symptoms, such as leaking fluid, bleeding, severe headaches, or abdominal cramping.  If you are using any tobacco products, including cigarettes, chewing tobacco, and electronic cigarettes.  If you have any questions.  Other tests that may be performed during your second trimester include:  Blood tests that check for: ? Low iron levels (anemia). ? High blood sugar that affects pregnant women (gestational diabetes) between 42 and 28 weeks. ? Rh antibodies. This is to check for a protein on red blood cells (Rh factor).  Urine tests to check for infections, diabetes, or protein in the urine.  An ultrasound to confirm the proper growth and development of the baby.  An amniocentesis to check for possible genetic problems.  Fetal screens for spina bifida and Down syndrome.  HIV (human immunodeficiency virus) testing. Routine prenatal testing includes screening for HIV, unless you choose not to have this test.  Follow these instructions at home: Medicines  Follow your health care provider's instructions regarding medicine use. Specific medicines may be either safe or unsafe to take during pregnancy.  Take a prenatal vitamin that contains at least 600 micrograms (mcg) of folic acid.  If you develop constipation, try taking a stool softener if your health care provider approves. Eating and drinking  Eat a balanced diet that includes fresh fruits and vegetables, whole grains, good sources of protein such as meat, eggs, or tofu, and low-fat  dairy. Your health care provider will help you  determine the amount of weight gain that is right for you.  Avoid raw meat and uncooked cheese. These carry germs that can cause birth defects in the baby.  If you have low calcium intake from food, talk to your health care provider about whether you should take a daily calcium supplement.  Limit foods that are high in fat and processed sugars, such as fried and sweet foods.  To prevent constipation: ? Drink enough fluid to keep your urine clear or pale yellow. ? Eat foods that are high in fiber, such as fresh fruits and vegetables, whole grains, and beans. Activity  Exercise only as directed by your health care provider. Most women can continue their usual exercise routine during pregnancy. Try to exercise for 30 minutes at least 5 days a week. Stop exercising if you experience uterine contractions.  Avoid heavy lifting, wear low heel shoes, and practice good posture.  A sexual relationship may be continued unless your health care provider directs you otherwise. Relieving pain and discomfort  Wear a good support bra to prevent discomfort from breast tenderness.  Take warm sitz baths to soothe any pain or discomfort caused by hemorrhoids. Use hemorrhoid cream if your health care provider approves.  Rest with your legs elevated if you have leg cramps or low back pain.  If you develop varicose veins, wear support hose. Elevate your feet for 15 minutes, 3-4 times a day. Limit salt in your diet. Prenatal Care  Write down your questions. Take them to your prenatal visits.  Keep all your prenatal visits as told by your health care provider. This is important. Safety  Wear your seat belt at all times when driving.  Make a list of emergency phone numbers, including numbers for family, friends, the hospital, and police and fire departments. General instructions  Ask your health care provider for a referral to a local prenatal education  class. Begin classes no later than the beginning of month 6 of your pregnancy.  Ask for help if you have counseling or nutritional needs during pregnancy. Your health care provider can offer advice or refer you to specialists for help with various needs.  Do not use hot tubs, steam rooms, or saunas.  Do not douche or use tampons or scented sanitary pads.  Do not cross your legs for long periods of time.  Avoid cat litter boxes and soil used by cats. These carry germs that can cause birth defects in the baby and possibly loss of the fetus by miscarriage or stillbirth.  Avoid all smoking, herbs, alcohol, and unprescribed drugs. Chemicals in these products can affect the formation and growth of the baby.  Do not use any products that contain nicotine or tobacco, such as cigarettes and e-cigarettes. If you need help quitting, ask your health care provider.  Visit your dentist if you have not gone yet during your pregnancy. Use a soft toothbrush to brush your teeth and be gentle when you floss. Contact a health care provider if:  You have dizziness.  You have mild pelvic cramps, pelvic pressure, or nagging pain in the abdominal area.  You have persistent nausea, vomiting, or diarrhea.  You have a bad smelling vaginal discharge.  You have pain when you urinate. Get help right away if:  You have a fever.  You are leaking fluid from your vagina.  You have spotting or bleeding from your vagina.  You have severe abdominal cramping or pain.  You have rapid weight gain or weight loss.  You have shortness of breath with chest pain.  You notice sudden or extreme swelling of your face, hands, ankles, feet, or legs.  You have not felt your baby move in over an hour.  You have severe headaches that do not go away when you take medicine.  You have vision changes. Summary  The second trimester is from week 14 through week 27 (months 4 through 6). It is also a time when the fetus is  growing rapidly.  Your body goes through many changes during pregnancy. The changes vary from woman to woman.  Avoid all smoking, herbs, alcohol, and unprescribed drugs. These chemicals affect the formation and growth your baby.  Do not use any tobacco products, such as cigarettes, chewing tobacco, and e-cigarettes. If you need help quitting, ask your health care provider.  Contact your health care provider if you have any questions. Keep all prenatal visits as told by your health care provider. This is important. This information is not intended to replace advice given to you by your health care provider. Make sure you discuss any questions you have with your health care provider. Document Released: 04/02/2001 Document Revised: 09/14/2015 Document Reviewed: 06/09/2012 Elsevier Interactive Patient Education  2017 [employer].

## 2017-02-04 NOTE — Progress Notes (Signed)
NOB physical- pt is feeling better, energy level is increasing

## 2017-02-04 NOTE — Progress Notes (Signed)
NEW OB HISTORY AND PHYSICAL  SUBJECTIVE:       Natalie Beck is a 26 y.o. G1P0 female, Patient's last menstrual period was 11/07/2016 (exact date)., Estimated Date of Delivery: 08/14/17, [redacted]w[redacted]d, presents today for establishment of Prenatal Care.  She has no unusual complaints and complains of breast tenderness.    Denies difficulty breathing or respiratory distress, chest pain, abdominal pain, vaginal bleeding, dysuria, and leg pain or swelling.   Desires genetic testing, prefers cell free DNA. Would like gender and results called to her mother, Erich Montane. Contact info: [phone] or [phone].     Gynecologic History  Patient's last menstrual period was 11/07/2016 (exact date).   Contraception: none   Last Pap: 2018. Results were: normal  Obstetric History  OB History  Gravida Para Term Preterm AB Living  1            SAB TAB Ectopic Multiple Live Births               # Outcome Date GA Lbr Len/2nd Weight Sex Delivery Anes PTL Lv  1 Current               Past Medical History:  Diagnosis Date  . Asthma     Past Surgical History:  Procedure Laterality Date  . dental implant    . WISDOM TOOTH EXTRACTION      Current Outpatient Prescriptions on File Prior to Visit  Medication Sig Dispense Refill  . Prenatal Vit-Fe Fumarate-FA (PRENATAL VITAMINS PLUS PO) Take by mouth.     No current facility-administered medications on file prior to visit.     No Known Allergies  Social History   Social History  . Marital status: Single    Spouse name: N/A  . Number of children: N/A  . Years of education: N/A   Occupational History  . Not on file.   Social History Main Topics  . Smoking status: Former Games developer  . Smokeless tobacco: Never Used  . Alcohol use No     Comment: not since pregnancy  . Drug use: No  . Sexual activity: Yes    Partners: Male    Birth control/ protection: None   Other Topics Concern  . Not on file   Social History Narrative   . No narrative on file    Family History  Problem Relation Age of Onset  . Asthma Father   . Asthma Sister   . Hypertension Sister   . Rheum arthritis Paternal Grandmother   . Cancer Paternal Grandmother        lung  . Cirrhosis Maternal Grandmother   . Cancer Other        maternal great grandmother-leukemia    The following portions of the patient's history were reviewed and updated as appropriate: allergies, current medications, past OB history, past medical history, past surgical history, past family history, past social history, and problem list.    OBJECTIVE:  Initial Physical Exam (New OB)  GENERAL APPEARANCE: alert, well appearing, in no apparent distress  HEAD: normocephalic, atraumatic  MOUTH: mucous membranes moist, pharynx normal without lesions and dental hygiene good  THYROID: no thyromegaly or masses present  BREASTS: not examined  LUNGS: clear to auscultation, no wheezes, rales or rhonchi, symmetric air entry  HEART: regular rate and rhythm, no murmurs  ABDOMEN: soft, nontender, nondistended, no abnormal masses, no epigastric pain, obese and FHT present  EXTREMITIES: no redness or tenderness in the calves or thighs  SKIN: normal coloration and turgor, no rashes  LYMPH NODES: no adenopathy palpable  NEUROLOGIC: alert, oriented, normal speech, no focal findings or movement disorder noted  PELVIC EXAM: not examined  ASSESSMENT: Normal pregnancy Desires genetic screening BMI>40  PLAN: Prenatal care Panorama today Early glucola next visit New OB counseling: The patient has been given an overview regarding routine prenatal care. Recommendations regarding diet, weight gain, and exercise in pregnancy were given. Prenatal testing, optional genetic testing, and ultrasound use in pregnancy were reviewed.  Benefits of Breast Feeding were discussed. The patient is encouraged to consider nursing her baby post partum. See orders   Gunnar Bulla, CNM

## 2017-02-11 ENCOUNTER — Encounter: Payer: Self-pay | Admitting: Certified Nurse Midwife

## 2017-02-17 ENCOUNTER — Telehealth: Payer: Self-pay | Admitting: Certified Nurse Midwife

## 2017-02-17 NOTE — Telephone Encounter (Signed)
Patient would like for someone to call her Mom with the babys gender  DPR signed

## 2017-02-17 NOTE — Telephone Encounter (Signed)
Spoke with mother.

## 2017-02-28 ENCOUNTER — Encounter: Payer: Self-pay | Admitting: Certified Nurse Midwife

## 2017-03-05 ENCOUNTER — Encounter: Payer: Self-pay | Admitting: Certified Nurse Midwife

## 2017-03-05 ENCOUNTER — Other Ambulatory Visit: Payer: Self-pay

## 2017-03-05 ENCOUNTER — Ambulatory Visit (INDEPENDENT_AMBULATORY_CARE_PROVIDER_SITE_OTHER): Payer: Medicaid Other | Admitting: Certified Nurse Midwife

## 2017-03-05 ENCOUNTER — Other Ambulatory Visit: Payer: Medicaid Other

## 2017-03-05 VITALS — BP 90/49 | HR 79 | Wt 225.1 lb

## 2017-03-05 DIAGNOSIS — Z3492 Encounter for supervision of normal pregnancy, unspecified, second trimester: Secondary | ICD-10-CM

## 2017-03-05 DIAGNOSIS — Z6841 Body Mass Index (BMI) 40.0 and over, adult: Secondary | ICD-10-CM

## 2017-03-05 LAB — POCT URINALYSIS DIPSTICK
Bilirubin, UA: NEGATIVE
Glucose, UA: NEGATIVE
Ketones, UA: NEGATIVE
Leukocytes, UA: NEGATIVE
NITRITE UA: NEGATIVE
PH UA: 6 (ref 5.0–8.0)
PROTEIN UA: NEGATIVE
RBC UA: NEGATIVE
SPEC GRAV UA: 1.02 (ref 1.010–1.025)
UROBILINOGEN UA: 0.2 U/dL

## 2017-03-05 NOTE — Progress Notes (Signed)
ROB, doing well. Discussed early GTT and gestational diabetes. Reviewed common muscular discomforts of pregnancy. Recommended use of belly band. Reviewed that she will have anatomy scan with next visit.She verbalizes and agrees to plan of care. Will follow up with 1 hr GTT results . ROB 4 wks.   Doreene BurkeAnnie Danile Trier, CNM

## 2017-03-05 NOTE — Patient Instructions (Signed)

## 2017-03-06 LAB — GLUCOSE, 1 HOUR GESTATIONAL: GESTATIONAL DIABETES SCREEN: 106 mg/dL (ref 65–139)

## 2017-03-31 ENCOUNTER — Encounter: Payer: Medicaid Other | Admitting: Certified Nurse Midwife

## 2017-03-31 ENCOUNTER — Other Ambulatory Visit: Payer: Medicaid Other

## 2017-04-02 ENCOUNTER — Encounter: Payer: Self-pay | Admitting: Certified Nurse Midwife

## 2017-04-03 ENCOUNTER — Ambulatory Visit (INDEPENDENT_AMBULATORY_CARE_PROVIDER_SITE_OTHER): Payer: Medicaid Other | Admitting: Certified Nurse Midwife

## 2017-04-03 ENCOUNTER — Ambulatory Visit (INDEPENDENT_AMBULATORY_CARE_PROVIDER_SITE_OTHER): Payer: Medicaid Other

## 2017-04-03 VITALS — BP 106/65 | HR 77 | Wt 226.7 lb

## 2017-04-03 DIAGNOSIS — Z3689 Encounter for other specified antenatal screening: Secondary | ICD-10-CM

## 2017-04-03 DIAGNOSIS — Z3492 Encounter for supervision of normal pregnancy, unspecified, second trimester: Secondary | ICD-10-CM

## 2017-04-03 LAB — POCT URINALYSIS DIPSTICK
Bilirubin, UA: NEGATIVE
Blood, UA: NEGATIVE
Glucose, UA: NEGATIVE
KETONES UA: NEGATIVE
LEUKOCYTES UA: NEGATIVE
NITRITE UA: NEGATIVE
PH UA: 8 (ref 5.0–8.0)
PROTEIN UA: NEGATIVE
Spec Grav, UA: 1.01 (ref 1.010–1.025)
UROBILINOGEN UA: 0.2 U/dL

## 2017-04-03 NOTE — Progress Notes (Signed)
ROB-Reports bright red bleeding with wiping last night after bowel movement, no active bleeding. Discussed home treatment measures. Anatomy scan today incomplete, findings reviewed with pt and she verbalizes understanding. Reviewed red flag symptoms and when to call. RTC x 2 weeks for follow up anatomy scan. RTC x 4 weeks for ROB with Pattricia BossAnnie or sooner if needed.   ULTRASOUND REPORT  Location: [facility] Date of Service:  04/03/17  Indications: Anatomy Findings:  Mason JimSingleton intrauterine pregnancy is visualized with FHR at 150 BPM. Biometrics give an (U/S) Gestational age of 26 1/7 weeks and an (U/S) EDD of 08/13/17; this correlates with the clinically established EDD of 08/14/17.  Fetal presentation is vertex.  EFW: 411 grams (0lb 15oz). Placenta: Posterior and grade 1.  Placenta is 7.3 cm from cervical os. AFI: WNL subjectively.  Anatomic survey is incomplete.  Views of the fetal heart and nose/lips are needed to complete survey. Gender - Female.   Neither ovary was visualized due to overlying bowel gas. Survey of the adnexa demonstrates no adnexal masses. There is no free peritoneal fluid in the cul de sac.  Impression: 1. 21 1/7 week Viable Singleton Intrauterine pregnancy by U/S. 2. (U/S) EDD is consistent with Clinically established (LMP) EDD of 08/14/17. 3. Incomplete anatomy scan - Views of the fetal heart and nose/lips are still needed.  Recommendations: 1.Clinical correlation with the patient's History and Physical Exam. 2.  F/U U/S recommended in 2 weeks to complete anatomical survey.

## 2017-04-03 NOTE — Patient Instructions (Signed)
Common Medications Safe in Pregnancy  Acne:      Constipation:  Benzoyl Peroxide     Colace  Clindamycin      Dulcolax Suppository  Topica Erythromycin     Fibercon  Salicylic Acid      Metamucil         Miralax AVOID:        Senakot   Accutane    Cough:  Retin-A       Cough Drops  Tetracycline      Phenergan w/ Codeine if Rx  Minocycline      Robitussin (Plain & DM)  Antibiotics:     Crabs/Lice:  Ceclor       RID  Cephalosporins    AVOID:  E-Mycins      Kwell  Keflex  Macrobid/Macrodantin   Diarrhea:  Penicillin      Kao-Pectate  Zithromax      Imodium AD         PUSH FLUIDS AVOID:       Cipro     Fever:  Tetracycline      Tylenol (Regular or Extra  Minocycline       Strength)  Levaquin      Extra Strength-Do not          Exceed 8 tabs/24 hrs Caffeine:        <200mg/day (equiv. To 1 cup of coffee or  approx. 3 12 oz sodas)         Gas: Cold/Hayfever:       Gas-X  Benadryl      Mylicon  Claritin       Phazyme  **Claritin-D        Chlor-Trimeton    Headaches:  Dimetapp      ASA-Free Excedrin  Drixoral-Non-Drowsy     Cold Compress  Mucinex (Guaifenasin)     Tylenol (Regular or Extra  Sudafed/Sudafed-12 Hour     Strength)  **Sudafed PE Pseudoephedrine   Tylenol Cold & Sinus     Vicks Vapor Rub  Zyrtec  **AVOID if Problems With Blood Pressure         Heartburn: Avoid lying down for at least 1 hour after meals  Aciphex      Maalox     Rash:  Milk of Magnesia     Benadryl    Mylanta       1% Hydrocortisone Cream  Pepcid  Pepcid Complete   Sleep Aids:  Prevacid      Ambien   Prilosec       Benadryl  Rolaids       Chamomile Tea  Tums (Limit 4/day)     Unisom  Zantac       Tylenol PM         Warm milk-add vanilla or  Hemorrhoids:       Sugar for taste  Anusol/Anusol H.C.  (RX: Analapram 2.5%)  Sugar Substitutes:  Hydrocortisone OTC     Ok in moderation  Preparation H      Tucks        Vaseline lotion applied to tissue with  wiping    Herpes:     Throat:  Acyclovir      Oragel  Famvir  Valtrex     Vaccines:         Flu Shot Leg Cramps:       *Gardasil  Benadryl      Hepatitis A         Hepatitis B Nasal Spray:         Pneumovax  Saline Nasal Spray     Polio Booster         Tetanus Nausea:       Tuberculosis test or PPD  Vitamin B6 25 mg TID   AVOID:    Dramamine      *Gardasil  Emetrol       Live Poliovirus  Ginger Root 250 mg QID    MMR (measles, mumps &  High Complex Carbs @ Bedtime    rebella)  Sea Bands-Accupressure    Varicella (Chickenpox)  Unisom 1/2 tab TID     *No known complications           If received before Pain:         Known pregnancy;   Darvocet       Resume series after  Lortab        Delivery  Percocet    Yeast:   Tramadol      Femstat  Tylenol 3      Gyne-lotrimin  Ultram       Monistat  Vicodin           MISC:         All Sunscreens           Hair Coloring/highlights          Insect Repellant's          (Including DEET)         Mystic Tans Second Trimester of Pregnancy The second trimester is from week 13 through week 28, month 4 through 6. This is often the time in pregnancy that you feel your best. Often times, morning sickness has lessened or quit. You may have more energy, and you may get hungry more often. Your unborn baby (fetus) is growing rapidly. At the end of the sixth month, he or she is about 9 inches long and weighs about 1 pounds. You will likely feel the baby move (quickening) between 18 and 20 weeks of pregnancy. Follow these instructions at home:  Avoid all smoking, herbs, and alcohol. Avoid drugs not approved by your doctor.  Do not use any tobacco products, including cigarettes, chewing tobacco, and electronic cigarettes. If you need help quitting, ask your doctor. You may get counseling or other support to help you quit.  Only take medicine as told by your doctor. Some medicines are safe and some are not during pregnancy.  Exercise only as told by your  doctor. Stop exercising if you start having cramps.  Eat regular, healthy meals.  Wear a good support bra if your breasts are tender.  Do not use hot tubs, steam rooms, or saunas.  Wear your seat belt when driving.  Avoid raw meat, uncooked cheese, and liter boxes and soil used by cats.  Take your prenatal vitamins.  Take 1500-2000 milligrams of calcium daily starting at the 20th week of pregnancy until you deliver your baby.  Try taking medicine that helps you poop (stool softener) as needed, and if your doctor approves. Eat more fiber by eating fresh fruit, vegetables, and whole grains. Drink enough fluids to keep your pee (urine) clear or pale yellow.  Take warm water baths (sitz baths) to soothe pain or discomfort caused by hemorrhoids. Use hemorrhoid cream if your doctor approves.  If you have puffy, bulging veins (varicose veins), wear support hose. Raise (elevate) your feet for 15 minutes, 3-4 times a day. Limit salt in your diet.  Avoid heavy lifting, wear low heals, and sit up straight.    Rest with your legs raised if you have leg cramps or low back pain.  Visit your dentist if you have not gone during your pregnancy. Use a soft toothbrush to brush your teeth. Be gentle when you floss.  You can have sex (intercourse) unless your doctor tells you not to.  Go to your doctor visits. Get help if:  You feel dizzy.  You have mild cramps or pressure in your lower belly (abdomen).  You have a nagging pain in your belly area.  You continue to feel sick to your stomach (nauseous), throw up (vomit), or have watery poop (diarrhea).  You have bad smelling fluid coming from your vagina.  You have pain with peeing (urination). Get help right away if:  You have a fever.  You are leaking fluid from your vagina.  You have spotting or bleeding from your vagina.  You have severe belly cramping or pain.  You lose or gain weight rapidly.  You have trouble catching your  breath and have chest pain.  You notice sudden or extreme puffiness (swelling) of your face, hands, ankles, feet, or legs.  You have not felt the baby move in over an hour.  You have severe headaches that do not go away with medicine.  You have vision changes. This information is not intended to replace advice given to you by your health care provider. Make sure you discuss any questions you have with your health care provider. Document Released: 07/03/2009 Document Revised: 09/14/2015 Document Reviewed: 06/09/2012 Elsevier Interactive Patient Education  2017 [place]. Round Ligament Pain The round ligament is a cord of muscle and tissue that helps to support the uterus. It can become a source of pain during pregnancy if it becomes stretched or twisted as the baby grows. The pain usually begins in the second trimester of pregnancy, and it can come and go until the baby is delivered. It is not a serious problem, and it does not cause harm to the baby. Round ligament pain is usually a short, sharp, and pinching pain, but it can also be a dull, lingering, and aching pain. The pain is felt in the lower side of the abdomen or in the groin. It usually starts deep in the groin and moves up to the outside of the hip area. Pain can occur with:  A sudden change in position.  Rolling over in bed.  Coughing or sneezing.  Physical activity.  Follow these instructions at home: Watch your condition for any changes. Take these steps to help with your pain:  When the pain starts, relax. Then try: ? Sitting down. ? Flexing your knees up to your abdomen. ? Lying on your side with one pillow under your abdomen and another pillow between your legs. ? Sitting in a warm bath for 15-20 minutes or until the pain goes away.  Take over-the-counter and prescription medicines only as told by your health care provider.  Move slowly when you sit and stand.  Avoid long walks if they cause pain.  Stop or  lessen your physical activities if they cause pain.  Contact a health care provider if:  Your pain does not go away with treatment.  You feel pain in your back that you did not have before.  Your medicine is not helping. Get help right away if:  You develop a fever or chills.  You develop uterine contractions.  You develop vaginal bleeding.  You develop nausea or vomiting.  You develop diarrhea.  You have  pain when you urinate. This information is not intended to replace advice given to you by your health care provider. Make sure you discuss any questions you have with your health care provider. Document Released: 01/16/2008 Document Revised: 09/14/2015 Document Reviewed: 06/15/2014 Elsevier Interactive Patient Education  [employer].

## 2017-04-03 NOTE — Progress Notes (Signed)
ROB- anatomy scan done today, pt states last night she wiped some bright red blood

## 2017-04-17 ENCOUNTER — Ambulatory Visit (INDEPENDENT_AMBULATORY_CARE_PROVIDER_SITE_OTHER): Payer: Medicaid Other

## 2017-04-17 DIAGNOSIS — Z3492 Encounter for supervision of normal pregnancy, unspecified, second trimester: Secondary | ICD-10-CM

## 2017-04-17 DIAGNOSIS — Z3689 Encounter for other specified antenatal screening: Secondary | ICD-10-CM | POA: Diagnosis not present

## 2017-05-02 ENCOUNTER — Encounter: Payer: Self-pay | Admitting: Certified Nurse Midwife

## 2017-05-02 ENCOUNTER — Ambulatory Visit (INDEPENDENT_AMBULATORY_CARE_PROVIDER_SITE_OTHER): Payer: Medicaid Other | Admitting: Certified Nurse Midwife

## 2017-05-02 VITALS — BP 98/54 | HR 79 | Wt 230.0 lb

## 2017-05-02 DIAGNOSIS — Z3492 Encounter for supervision of normal pregnancy, unspecified, second trimester: Secondary | ICD-10-CM

## 2017-05-02 LAB — POCT URINALYSIS DIPSTICK
BILIRUBIN UA: NEGATIVE
Blood, UA: NEGATIVE
GLUCOSE UA: NEGATIVE
KETONES UA: NEGATIVE
Leukocytes, UA: NEGATIVE
Nitrite, UA: NEGATIVE
PH UA: 6.5 (ref 5.0–8.0)
Protein, UA: NEGATIVE
Spec Grav, UA: 1.025 (ref 1.010–1.025)
Urobilinogen, UA: 0.2 E.U./dL

## 2017-05-02 NOTE — Progress Notes (Signed)
ROB- Patient feels well with no complaints.  

## 2017-05-02 NOTE — Patient Instructions (Signed)

## 2017-05-02 NOTE — Progress Notes (Signed)
Body mass index is 44.92 kg/m. ROB, Discussed glucose testing at next visit. Also discussed BMI and need for anesthesia consult. Pt verbalizes understanding and agrees to plan. Order placed.

## 2017-05-16 ENCOUNTER — Encounter: Payer: Self-pay | Admitting: Certified Nurse Midwife

## 2017-05-23 ENCOUNTER — Ambulatory Visit (INDEPENDENT_AMBULATORY_CARE_PROVIDER_SITE_OTHER): Payer: Medicaid Other | Admitting: Certified Nurse Midwife

## 2017-05-23 ENCOUNTER — Other Ambulatory Visit: Payer: Medicaid Other

## 2017-05-23 VITALS — BP 103/68 | HR 78 | Wt 233.8 lb

## 2017-05-23 DIAGNOSIS — Z130 Encounter for screening for diseases of the blood and blood-forming organs and certain disorders involving the immune mechanism: Secondary | ICD-10-CM

## 2017-05-23 DIAGNOSIS — Z3493 Encounter for supervision of normal pregnancy, unspecified, third trimester: Secondary | ICD-10-CM

## 2017-05-23 DIAGNOSIS — Z131 Encounter for screening for diabetes mellitus: Secondary | ICD-10-CM

## 2017-05-23 NOTE — Patient Instructions (Signed)
Pain Relief During Labor and Delivery Many things can cause pain during labor and delivery, including:  Pressure on bones and ligaments due to the baby moving through the pelvis.  Stretching of tissues due to the baby moving through the birth canal.  Muscle tension due to anxiety or nervousness.  The uterus tightening (contracting) and relaxing to help move the baby.  There are many ways to deal with the pain of labor and delivery. They include:  Taking prenatal classes. Taking these classes helps you know what to expect during your baby's birth. What you learn will increase your confidence and decrease your anxiety.  Practicing relaxation techniques or doing relaxing activities, such as: ? Focused breathing. ? Meditation. ? Visualization. ? Aroma therapy. ? Listening to your favorite music. ? Hypnosis.  Taking a warm shower or bath (hydrotherapy). This may: ? Provide comfort and relaxation. ? Lessen your perception of pain. ? Decrease the amount of pain medicine needed. ? Decrease the length of labor.  Getting a massage or counterpressure on your back.  Applying warm packs or ice packs.  Changing positions often, moving around, or using a birthing ball.  Getting: ? Pain medicine through an IV or injection into a muscle. ? Pain medicine inserted into your spinal column. ? Injections of sterile water just under the skin on your lower back (intradermal injections). ? Laughing gas (nitrous oxide).  Discuss your pain control options with your health care provider during your prenatal visits. Explore the options offered by your hospital or birth center. What kinds of medicine are available? There are two kinds of medicines that can be used to relieve pain during labor and delivery:  Analgesics. These medicines decrease pain without causing you to lose feeling or the ability to move your muscles.  Anesthetics. These medicines block feeling in the body and can decrease your  ability to move freely.  Both of these kinds of medicine can cause minor side effects, such as nausea, trouble concentrating, and sleepiness. They can also decrease the baby's heart rate before birth and affect the baby's breathing rate after birth. For this reason, health care providers are careful about when and how much medicine is given. What are specific medicines and procedures that provide pain relief? Local Anesthetics Local anesthetics are used to numb a small area of the body. They may be used along with another kind of anesthetic or used to numb the nerves of the vagina, cervix, and perineum during the second stage of labor. General Anesthetics General anesthetics cause you to lose consciousness so you do not feel pain. They are usually only used for an emergency cesarean delivery. General anesthetics are given through an IV tube and a mask. Pudendal Block A pudendal block is a form of local anesthetic. It may be used to relieve the pain associated with pushing or stretching of the perineum at the time of delivery or to further numb the perineum. A pudendal block is done by injecting numbing medicine through the vaginal wall into a nerve in the pelvis. Epidural Analgesia Epidural analgesia is given through a flexible IV catheter that is inserted into the lower back. Numbing medicine is delivered continuously to the area near your spinal column nerves (epidural space). After having this type of analgesia, you may be able to move your legs but you most likely will not be able to walk. Depending on the amount of medicine given, you may lose all feeling in the lower half of your body, or you may  retain some level of sensation, including the urge to push. Epidural analgesia can be used to provide pain relief for a vaginal birth. Spinal Block A spinal block is similar to epidural analgesia, but the medicine is injected into the spinal fluid instead of the epidural space. A spinal block is only  given once. It starts to relieve pain quickly, but the pain relief lasts only 1-6 hours. Spinal blocks can be used for cesarean deliveries. Combined Spinal-Epidural (CSE) Block A CSE block combines the effects of a spinal block and epidural analgesia. The spinal block works quickly to block all pain. The epidural analgesia provides continuous pain relief, even after the effects of the spinal block have worn off. This information is not intended to replace advice given to you by your health care provider. Make sure you discuss any questions you have with your health care provider. Document Released: 07/25/2008 Document Revised: 09/15/2015 Document Reviewed: 08/30/2015 Elsevier Interactive Patient Education  2018 [place]. Abdominal Pain During Pregnancy Belly (abdominal) pain is common during pregnancy. Most of the time, it is not a serious problem. Other times, it can be a sign that something is wrong with the pregnancy. Always tell your doctor if you have belly pain. Follow these instructions at home: Monitor your belly pain for any changes. The following actions may help you feel better:  Do not have sex (intercourse) or put anything in your vagina until you feel better.  Rest until your pain stops.  Drink clear fluids if you feel sick to your stomach (nauseous). Do not eat solid food until you feel better.  Only take medicine as told by your doctor.  Keep all doctor visits as told.  Get help right away if:  You are bleeding, leaking fluid, or pieces of tissue come out of your vagina.  You have more pain or cramping.  You keep throwing up (vomiting).  You have pain when you pee (urinate) or have blood in your pee.  You have a fever.  You do not feel your baby moving as much.  You feel very weak or feel like passing out.  You have trouble breathing, with or without belly pain.  You have a very bad headache and belly pain.  You have fluid leaking from your vagina and  belly pain.  You keep having watery poop (diarrhea).  Your belly pain does not go away after resting, or the pain gets worse. This information is not intended to replace advice given to you by your health care provider. Make sure you discuss any questions you have with your health care provider. Document Released: 03/27/2009 Document Revised: 11/15/2015 Document Reviewed: 11/05/2012 Elsevier Interactive Patient Education  2018 [place]. Back Pain in Pregnancy Back pain during pregnancy is common. Back pain may be caused by several factors that are related to changes during your pregnancy. Follow these instructions at home: Managing pain, stiffness, and swelling  If directed, apply ice for sudden (acute) back pain. ? Put ice in a plastic bag. ? Place a towel between your skin and the bag. ? Leave the ice on for 20 minutes, 2-3 times per day.  If directed, apply heat to the affected area before you exercise: ? Place a towel between your skin and the heat pack or heating pad. ? Leave the heat on for 20-30 minutes. ? Remove the heat if your skin turns bright red. This is especially important if you are unable to feel pain, heat, or cold. You may have a  greater risk of getting burned. Activity  Exercise as told by your health care provider. Exercising is the best way to prevent or manage back pain.  Listen to your body when lifting. If lifting hurts, ask for help or bend your knees. This uses your leg muscles instead of your back muscles.  Squat down when picking up something from the floor. Do not bend over.  Only use bed rest as told by your health care provider. Bed rest should only be used for the most severe episodes of back pain. Standing, Sitting, and Lying Down  Do not stand in one place for long periods of time.  Use good posture when sitting. Make sure your head rests over your shoulders and is not hanging forward. Use a pillow on your lower back if necessary.  Try  sleeping on your side, preferably the left side, with a pillow or two between your legs. If you are sore after a night's rest, your bed may be too soft. A firm mattress may provide more support for your back during pregnancy. General instructions  Do not wear high heels.  Eat a healthy diet. Try to gain weight within your health care provider's recommendations.  Use a maternity girdle, elastic sling, or back brace as told by your health care provider.  Take over-the-counter and prescription medicines only as told by your health care provider.  Keep all follow-up visits as told by your health care provider. This is important. This includes any visits with any specialists, such as a physical therapist. Contact a health care provider if:  Your back pain interferes with your daily activities.  You have increasing pain in other parts of your body. Get help right away if:  You develop numbness, tingling, weakness, or problems with the use of your arms or legs.  You develop severe back pain that is not controlled with medicine.  You have a sudden change in bowel or bladder control.  You develop shortness of breath, dizziness, or you faint.  You develop nausea, vomiting, or sweating.  You have back pain that is a rhythmic, cramping pain similar to labor pains. Labor pain is usually 1-2 minutes apart, lasts for about 1 minute, and involves a bearing down feeling or pressure in your pelvis.  You have back pain and your water breaks or you have vaginal bleeding.  You have back pain or numbness that travels down your leg.  Your back pain developed after you fell.  You develop pain on one side of your back.  You see blood in your urine.  You develop skin blisters in the area of your back pain. This information is not intended to replace advice given to you by your health care provider. Make sure you discuss any questions you have with your health care provider. Document Released:  07/17/2005 Document Revised: 09/14/2015 Document Reviewed: 12/21/2014 Elsevier Interactive Patient Education  2018 [place] and Pregnancy Immunizations, or vaccines, can help to keep you healthy. They can also protect your baby from some diseases until your baby is old enough to safely receive them. If you are pregnant or you are planning a pregnancy, the vaccines that you need are determined by:  Your age.  Your lifestyle.  Your medical history.  Your travel plans.  Your previous vaccines.  The benefits of receiving immunizations during pregnancy usually outweigh the risks:  When the risk of being exposed to a disease is high.  When infection would pose a risk to you or  your unborn baby.  When the vaccine is not likely to cause harm.  Should I receive immunizations before pregnancy? If possible, make sure that your vaccines are up to date before you become pregnant. It is safe and important for you to receive weakened viral and weakened bacterial (inactivated) vaccines, as needed, before you are pregnant. Live viral and live bacterial (attenuated) vaccines should be given 1 month or more before pregnancy. Some examples of attenuated vaccines include:  Live attenuated influenza vaccine (LAIV).  Measles, mumps, and rubella (MMR).  Measles, mumps, rubella, and varicella (MMRV).  Rotavirus (RV5 or RV1).  Smallpox.  Typhoid (Ty21a, oral capsule form of the vaccine).  Varicella (VAR).  Shingles.  Yellow fever (YF).  If you become pregnant within 1 month after you have received an attenuated vaccine, contact your health care provider. Should I receive immunizations during pregnancy? It is safe and important for you to receive inactivated vaccines as needed during pregnancy. Until your baby can receive vaccines, your baby will get some protection from diseases through the vaccines that you receive while you are pregnant. However, some inactivated vaccines  have not been thoroughly studied in pregnant women, and at this time, they are not recommended during pregnancy unless the benefits outweigh the risks. One example is the pneumococcal polysaccharide vaccine (PPSV23). In addition, the human papillomavirus (HPV4 or HPV2) vaccine is not recommended during pregnancy. You should receive inactivated influenza (IIV) and adult tetanus, diphtheria, and acellular pertussis (Tdap) vaccines during your pregnancy. The IIV, which is known as "the flu shot," will protect you and your baby (up to 99 months of age) from some complications and strains of influenza. Pregnant women can receive IIV at any time and during any trimester. The Tdap vaccine will help to prevent whooping cough (pertussis) in you and your baby. You should receive 1 dose of this vaccine during each pregnancy. It is recommended that pregnant women receive this vaccine during the 27th-36th weeks of pregnancy. Usually, attenuated vaccines are not given to pregnant women. There is a possible risk of passing the vaccine virus or bacteria to the unborn baby. If you are pregnant and you received an attenuated vaccine, contact your health care provider. Should I receive immunizations after pregnancy? It is safe and important for you to receive vaccines as needed after pregnancy. This is true even if you are breastfeeding. If you did not receive the Tdap vaccine during your pregnancy, you should receive that vaccine right after you give birth to your baby (delivery). If you are not immune to measles, mumps, rubella, or varicella, you should receive the MMR or MMRV vaccine within days after delivery. Most other vaccines are also safe to receive after pregnancy. What if I am pregnant and I plan to travel internationally? If you are pregnant and you are planning to travel internationally, talk with your health care provider at least 4-6 weeks before your trip. Discuss precautions or vaccine options. Before you receive  vaccines, the risk of disease and immunization should always be determined. Immunizations that are recommended for pregnant international travelers include:  Hepatitis B (HepB).  IIV.  Tetanus and diphtheria (Td) or Tdap.  Hepatitis A (HepA).  Immunizations that should be delayed or given only when benefits outweigh the risk of disease exposure for pregnant international travelers include:  Japanese encephalitis (JE).  Meningococcal meningitis (MPSV4 or MCV4).  PPSV23.  Inactivated polio (IPV).  Rabies.  Typhoid.  YF.  Immunizations that should not be given to pregnant international travelers  include:  Tuberculosis (BCG).  MMR.  MMRV.  HPV4 or HPV2.  VAR.  LAIV.  This information is not intended to replace advice given to you by your health care provider. Make sure you discuss any questions you have with your health care provider. Document Released: 04/28/2007 Document Revised: 09/08/2015 Document Reviewed: 05/16/2014 Elsevier Interactive Patient Education  2018 [place] of Pregnancy The third trimester is from week 29 through week 42, months 7 through 9. This trimester is when your unborn baby (fetus) is growing very fast. At the end of the ninth month, the unborn baby is about 20 inches in length. It weighs about 6-10 pounds. Follow these instructions at home:  Avoid all smoking, herbs, and alcohol. Avoid drugs not approved by your doctor.  Do not use any tobacco products, including cigarettes, chewing tobacco, and electronic cigarettes. If you need help quitting, ask your doctor. You may get counseling or other support to help you quit.  Only take medicine as told by your doctor. Some medicines are safe and some are not during pregnancy.  Exercise only as told by your doctor. Stop exercising if you start having cramps.  Eat regular, healthy meals.  Wear a good support bra if your breasts are tender.  Do not use hot tubs, steam  rooms, or saunas.  Wear your seat belt when driving.  Avoid raw meat, uncooked cheese, and liter boxes and soil used by cats.  Take your prenatal vitamins.  Take 1500-2000 milligrams of calcium daily starting at the 20th week of pregnancy until you deliver your baby.  Try taking medicine that helps you poop (stool softener) as needed, and if your doctor approves. Eat more fiber by eating fresh fruit, vegetables, and whole grains. Drink enough fluids to keep your pee (urine) clear or pale yellow.  Take warm water baths (sitz baths) to soothe pain or discomfort caused by hemorrhoids. Use hemorrhoid cream if your doctor approves.  If you have puffy, bulging veins (varicose veins), wear support hose. Raise (elevate) your feet for 15 minutes, 3-4 times a day. Limit salt in your diet.  Avoid heavy lifting, wear low heels, and sit up straight.  Rest with your legs raised if you have leg cramps or low back pain.  Visit your dentist if you have not gone during your pregnancy. Use a soft toothbrush to brush your teeth. Be gentle when you floss.  You can have sex (intercourse) unless your doctor tells you not to.  Do not travel far distances unless you must. Only do so with your doctor's approval.  Take prenatal classes.  Practice driving to the hospital.  Pack your hospital bag.  Prepare the baby's room.  Go to your doctor visits. Get help if:  You are not sure if you are in labor or if your water has broken.  You are dizzy.  You have mild cramps or pressure in your lower belly (abdominal).  You have a nagging pain in your belly area.  You continue to feel sick to your stomach (nauseous), throw up (vomit), or have watery poop (diarrhea).  You have bad smelling fluid coming from your vagina.  You have pain with peeing (urination). Get help right away if:  You have a fever.  You are leaking fluid from your vagina.  You are spotting or bleeding from your vagina.  You have  severe belly cramping or pain.  You lose or gain weight rapidly.  You have trouble catching your breath and have  chest pain.  You notice sudden or extreme puffiness (swelling) of your face, hands, ankles, feet, or legs.  You have not felt the baby move in over an hour.  You have severe headaches that do not go away with medicine.  You have vision changes. This information is not intended to replace advice given to you by your health care provider. Make sure you discuss any questions you have with your health care provider. Document Released: 07/03/2009 Document Revised: 09/14/2015 Document Reviewed: 06/09/2012 Elsevier Interactive Patient Education  2017 [place] Natural childbirth is going through labor and delivery without any drugs to relieve pain. Additionally, fetal monitors are not used, a delivery is not done, and a surgical cut to enlarge the vaginal opening (episiotomy) is not made. With the help of a birthing professional (midwife or health care provider), you direct your own labor and delivery. Many women choose natural childbirth because it makes them feel more in control and in touch with their labor and delivery. Some woman also choose natural childbirth because they are concerned about medicines affecting them and their babies. Pregnant women with a high-risk pregnancy should not attempt natural childbirth. It is better to deliver the infant in a hospital if an emergency situation arises. Sometimes, a health care provider has to get involved for the health and safety of the mother and infant. Techniques for natural childbirth  The [place] method-This method teaches parents that having a baby is normal, healthy, and natural. It also teaches the mother to take a neutral position regarding pain medicine and numbing medicines and to make an informed decision about using these medicines when the time comes.  The [place] Fray (also called husband-coached  birth)-This method teaches the father or partner to be the birth coach. It encourages the mother to exercise and eat a balanced, nutritious diet. It also involves relaxation and deep breathing exercises and preparing the parents for emergency situations. Methods of dealing with labor pain and delivery  Meditation.  Yoga.  Hypnosis.  Acupuncture.  Massage.  Changing positions (walking, rocking, showering, leaning on birth balls).  Lying in warm water or a whirlpool bath.  Finding an activity that keeps your mind off of the labor pain.  Listening to soft music.  Focusing on a particular object (visual imagery). Before going into labor  Be sure you and your spouse or partner are in agreement about having a natural childbirth.  Decide if your health care provider or a midwife will deliver your baby.  Decide if you will have your baby in the hospital, at a birthing center, or at home.  If you have children, make plans to have someone take care of them when you go to the hospital or birthing center.  Know the distance and the time it takes to go to the hospital or birthing center. Practice going there and time it to be sure.  Have a bag packed with a nightgown, bathrobe, and toiletries. Be ready to take it with you when you go into labor.  Keep phone numbers of your family and friends handy if you need to call someone when you go into labor.  Your spouse or partner should go to all the natural childbirth technique classes.  Talk with your health care provider about the possibility of a medical emergency and what will happen if that occurs. Advantages of natural childbirth  You are in control of your labor and delivery.  You will not take medicines that could affect you  and the baby.  There are no invasive procedures, such as an episiotomy.  You and your spouse or partner will work together, which can increase your bond with each other.  In most delivery centers, your  family and friends can be involved in the labor and delivery process. Disadvantages of natural childbirth  You will experience pain during your labor and delivery.  The methods of helping relieve your labor pains may not work for you.  You may feel disappointed if you decide to change your mind during labor and not have a natural childbirth. After the delivery  You will be very tired.  You will be uncomfortable because of your uterus contracting. You will feel soreness around the vagina.  You may feel cold and shaky. This is a natural reaction. This information is not intended to replace advice given to you by your health care provider. Make sure you discuss any questions you have with your health care provider. Document Released: 03/21/2008 Document Revised: 09/14/2015 Document Reviewed: 12/14/2012 Elsevier Interactive Patient Education  2017 [place]. Breastfeeding Choosing to breastfeed is one of the best decisions you can make for yourself and your baby. A change in hormones during pregnancy causes your breasts to make breast milk in your milk-producing glands. Hormones prevent breast milk from being released before your baby is born. They also prompt milk flow after birth. Once breastfeeding has begun, thoughts of your baby, as well as his or her sucking or crying, can stimulate the release of milk from your milk-producing glands. Benefits of breastfeeding Research shows that breastfeeding offers many health benefits for infants and mothers. It also offers a cost-free and convenient way to feed your baby. For your baby  Your first milk (colostrum) helps your baby's digestive system to function better.  Special cells in your milk (antibodies) help your baby to fight off infections.  Breastfed babies are less likely to develop asthma, allergies, obesity, or type 2 diabetes. They are also at lower risk for sudden infant death syndrome (SIDS).  Nutrients in breast milk are better  able to meet your baby's needs compared to infant formula.  Breast milk improves your baby's brain development. For you  Breastfeeding helps to create a very special bond between you and your baby.  Breastfeeding is convenient. Breast milk costs nothing and is always available at the correct temperature.  Breastfeeding helps to burn calories. It helps you to lose the weight that you gained during pregnancy.  Breastfeeding makes your uterus return faster to its size before pregnancy. It also slows bleeding (lochia) after you give birth.  Breastfeeding helps to lower your risk of developing type 2 diabetes, osteoporosis, rheumatoid arthritis, cardiovascular disease, and breast, ovarian, uterine, and endometrial cancer later in life. Breastfeeding basics Starting breastfeeding  Find a comfortable place to sit or lie down, with your neck and back well-supported.  Place a pillow or a rolled-up blanket under your baby to bring him or her to the level of your breast (if you are seated). Nursing pillows are specially designed to help support your arms and your baby while you breastfeed.  Make sure that your baby's tummy (abdomen) is facing your abdomen.  Gently massage your breast. With your fingertips, massage from the outer edges of your breast inward toward the nipple. This encourages milk flow. If your milk flows slowly, you may need to continue this action during the feeding.  Support your breast with 4 fingers underneath and your thumb above your nipple (make the  letter "C" with your hand). Make sure your fingers are well away from your nipple and your baby's mouth.  Stroke your baby's lips gently with your finger or nipple.  When your baby's mouth is open wide enough, quickly bring your baby to your breast, placing your entire nipple and as much of the areola as possible into your baby's mouth. The areola is the colored area around your nipple. ? More areola should be visible above your  baby's upper lip than below the lower lip. ? Your baby's lips should be opened and extended outward (flanged) to ensure an adequate, comfortable latch. ? Your baby's tongue should be between his or her lower gum and your breast.  Make sure that your baby's mouth is correctly positioned around your nipple (latched). Your baby's lips should create a seal on your breast and be turned out (everted).  It is common for your baby to suck about 2-3 minutes in order to start the flow of breast milk. Latching Teaching your baby how to latch onto your breast properly is very important. An improper latch can cause nipple pain, decreased milk supply, and poor weight gain in your baby. Also, if your baby is not latched onto your nipple properly, he or she may swallow some air during feeding. This can make your baby fussy. Burping your baby when you switch breasts during the feeding can help to get rid of the air. However, teaching your baby to latch on properly is still the best way to prevent fussiness from swallowing air while breastfeeding. Signs that your baby has successfully latched onto your nipple  Silent tugging or silent sucking, without causing you pain. Infant's lips should be extended outward (flanged).  Swallowing heard between every 3-4 sucks once your milk has started to flow (after your let-down milk reflex occurs).  Muscle movement above and in front of his or her ears while sucking.  Signs that your baby has not successfully latched onto your nipple  Sucking sounds or smacking sounds from your baby while breastfeeding.  Nipple pain.  If you think your baby has not latched on correctly, slip your finger into the corner of your baby's mouth to break the suction and place it between your baby's gums. Attempt to start breastfeeding again. Signs of successful breastfeeding Signs from your baby  Your baby will gradually decrease the number of sucks or will completely stop sucking.  Your  baby will fall asleep.  Your baby's body will relax.  Your baby will retain a small amount of milk in his or her mouth.  Your baby will let go of your breast by himself or herself.  Signs from you  Breasts that have increased in firmness, weight, and size 1-3 hours after feeding.  Breasts that are softer immediately after breastfeeding.  Increased milk volume, as well as a change in milk consistency and color by the fifth day of breastfeeding.  Nipples that are not sore, cracked, or bleeding.  Signs that your baby is getting enough milk  Wetting at least 1-2 diapers during the first 24 hours after birth.  Wetting at least 5-6 diapers every 24 hours for the first week after birth. The urine should be clear or pale yellow by the age of 5 days.  Wetting 6-8 diapers every 24 hours as your baby continues to grow and develop.  At least 3 stools in a 24-hour period by the age of 5 days. The stool should be soft and yellow.  At least  3 stools in a 24-hour period by the age of 7 days. The stool should be seedy and yellow.  No loss of weight greater than 10% of birth weight during the first 3 days of life.  Average weight gain of 4-7 oz (113-198 g) per week after the age of 4 days.  Consistent daily weight gain by the age of 5 days, without weight loss after the age of 2 weeks. After a feeding, your baby may spit up a small amount of milk. This is normal. Breastfeeding frequency and duration Frequent feeding will help you make more milk and can prevent sore nipples and extremely full breasts (breast engorgement). Breastfeed when you feel the need to reduce the fullness of your breasts or when your baby shows signs of hunger. This is called "breastfeeding on demand." Signs that your baby is hungry include:  Increased alertness, activity, or restlessness.  Movement of the head from side to side.  Opening of the mouth when the corner of the mouth or cheek is stroked  (rooting).  Increased sucking sounds, smacking lips, cooing, sighing, or squeaking.  Hand-to-mouth movements and sucking on fingers or hands.  Fussing or crying.  Avoid introducing a pacifier to your baby in the first 4-6 weeks after your baby is born. After this time, you may choose to use a pacifier. Research has shown that pacifier use during the first year of a baby's life decreases the risk of sudden infant death syndrome (SIDS). Allow your baby to feed on each breast as long as he or she wants. When your baby unlatches or falls asleep while feeding from the first breast, offer the second breast. Because newborns are often sleepy in the first few weeks of life, you may need to awaken your baby to get him or her to feed. Breastfeeding times will vary from baby to baby. However, the following rules can serve as a guide to help you make sure that your baby is properly fed:  Newborns (babies 27 weeks of age or younger) may breastfeed every 1-3 hours.  Newborns should not go without breastfeeding for longer than 3 hours during the day or 5 hours during the night.  You should breastfeed your baby a minimum of 8 times in a 24-hour period.  Breast milk pumping Pumping and storing breast milk allows you to make sure that your baby is exclusively fed your breast milk, even at times when you are unable to breastfeed. This is especially important if you go back to work while you are still breastfeeding, or if you are not able to be present during feedings. Your lactation consultant can help you find a method of pumping that works best for you and give you guidelines about how long it is safe to store breast milk. Caring for your breasts while you breastfeed Nipples can become dry, cracked, and sore while breastfeeding. The following recommendations can help keep your breasts moisturized and healthy:  Avoid using soap on your nipples.  Wear a supportive bra designed especially for nursing. Avoid  wearing underwire-style bras or extremely tight bras (sports bras).  Air-dry your nipples for 3-4 minutes after each feeding.  Use only cotton bra pads to absorb leaked breast milk. Leaking of breast milk between feedings is normal.  Use lanolin on your nipples after breastfeeding. Lanolin helps to maintain your skin's normal moisture barrier. Pure lanolin is not harmful (not toxic) to your baby. You may also hand express a few drops of breast milk and gently  massage that milk into your nipples and allow the milk to air-dry.  In the first few weeks after giving birth, some women experience breast engorgement. Engorgement can make your breasts feel heavy, warm, and tender to the touch. Engorgement peaks within 3-5 days after you give birth. The following recommendations can help to ease engorgement:  Completely empty your breasts while breastfeeding or pumping. You may want to start by applying warm, moist heat (in the shower or with warm, water-soaked hand towels) just before feeding or pumping. This increases circulation and helps the milk flow. If your baby does not completely empty your breasts while breastfeeding, pump any extra milk after he or she is finished.  Apply ice packs to your breasts immediately after breastfeeding or pumping, unless this is too uncomfortable for you. To do this: ? Put ice in a plastic bag. ? Place a towel between your skin and the bag. ? Leave the ice on for 20 minutes, 2-3 times a day.  Make sure that your baby is latched on and positioned properly while breastfeeding.  If engorgement persists after 48 hours of following these recommendations, contact your health care provider or a Science writer. Overall health care recommendations while breastfeeding  Eat 3 healthy meals and 3 snacks every day. Well-nourished mothers who are breastfeeding need an additional 450-500 calories a day. You can meet this requirement by increasing the amount of a balanced diet  that you eat.  Drink enough water to keep your urine pale yellow or clear.  Rest often, relax, and continue to take your prenatal vitamins to prevent fatigue, stress, and low vitamin and mineral levels in your body (nutrient deficiencies).  Do not use any products that contain nicotine or tobacco, such as cigarettes and e-cigarettes. Your baby may be harmed by chemicals from cigarettes that pass into breast milk and exposure to secondhand smoke. If you need help quitting, ask your health care provider.  Avoid alcohol.  Do not use illegal drugs or marijuana.  Talk with your health care provider before taking any medicines. These include over-the-counter and prescription medicines as well as vitamins and herbal supplements. Some medicines that may be harmful to your baby can pass through breast milk.  It is possible to become pregnant while breastfeeding. If birth control is desired, ask your health care provider about options that will be safe while breastfeeding your baby. Where to find more information: [employer]: www.llli.org Contact a health care provider if:  You feel like you want to stop breastfeeding or have become frustrated with breastfeeding.  Your nipples are cracked or bleeding.  Your breasts are red, tender, or warm.  You have: ? Painful breasts or nipples. ? A swollen area on either breast. ? A fever or chills. ? Nausea or vomiting. ? Drainage other than breast milk from your nipples.  Your breasts do not become full before feedings by the fifth day after you give birth.  You feel sad and depressed.  Your baby is: ? Too sleepy to eat well. ? Having trouble sleeping. ? More than 92 week old and wetting fewer than 6 diapers in a 24-hour period. ? Not gaining weight by 50 days of age.  Your baby has fewer than 3 stools in a 24-hour period.  Your baby's skin or the white parts of his or her eyes become yellow. Get help right away if:  Your  baby is overly tired (lethargic) and does not want to wake up and feed.  Your baby develops an unexplained fever. Summary  Breastfeeding offers many health benefits for infant and mothers.  Try to breastfeed your infant when he or she shows early signs of hunger.  Gently tickle or stroke your baby's lips with your finger or nipple to allow the baby to open his or her mouth. Bring the baby to your breast. Make sure that much of the areola is in your baby's mouth. Offer one side and burp the baby before you offer the other side.  Talk with your health care provider or lactation consultant if you have questions or you face problems as you breastfeed. This information is not intended to replace advice given to you by your health care provider. Make sure you discuss any questions you have with your health care provider. Document Released: 04/08/2005 Document Revised: 05/10/2016 Document Reviewed: 05/10/2016 Elsevier Interactive Patient Education  2018 [employer]. [facility]  [place], [place], [place] [address]  Phone: [phone]   [facility] (second location)  [address] [place], [place] [address]  Phone: [phone]   [facility]) [place], [place], [place] [address] Phone: [phone]   [address] [place], [place] [address]  Phone: [phone]

## 2017-05-23 NOTE — Progress Notes (Signed)
ROB- glucola done today, blood consent signed,pt is doing well 

## 2017-05-24 ENCOUNTER — Encounter: Payer: Self-pay | Admitting: Certified Nurse Midwife

## 2017-05-24 DIAGNOSIS — O99019 Anemia complicating pregnancy, unspecified trimester: Secondary | ICD-10-CM | POA: Insufficient documentation

## 2017-05-24 LAB — CBC
HEMATOCRIT: 29.6 % — AB (ref 34.0–46.6)
Hemoglobin: 9.4 g/dL — ABNORMAL LOW (ref 11.1–15.9)
MCH: 27.1 pg (ref 26.6–33.0)
MCHC: 31.8 g/dL (ref 31.5–35.7)
MCV: 85 fL (ref 79–97)
Platelets: 192 10*3/uL (ref 150–379)
RBC: 3.47 x10E6/uL — ABNORMAL LOW (ref 3.77–5.28)
RDW: 16.8 % — AB (ref 12.3–15.4)
WBC: 12.1 10*3/uL — ABNORMAL HIGH (ref 3.4–10.8)

## 2017-05-24 LAB — RPR: RPR Ser Ql: NONREACTIVE

## 2017-05-24 LAB — GLUCOSE, 1 HOUR GESTATIONAL: GESTATIONAL DIABETES SCREEN: 109 mg/dL (ref 65–139)

## 2017-05-24 NOTE — Progress Notes (Signed)
ROB-Doing well. Desires waterbirth. Scheduled for class on 06/11/2017. Will attend Breastfeeding Class as well. Waterbirth literature reviewed prior to today's visit and consent completed and signed. Anesthesia consult scheduled for 05/26/2017. Obtained gym membership. 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 Declines TDaP, questions safety of vaccines in pregnancy and childhood. Safety and importance discussed at length. Peer reviewed articles and CDC schedule sent via 'snail mail'. Glucola, CBC, and RPR today. Anticipatory guidance regarding course of prenatal care. Reviewed red flag symptoms and when to call. RTC x 2 weeks for ROB or sooner if needed.

## 2017-05-26 ENCOUNTER — Encounter
Admission: RE | Admit: 2017-05-26 | Discharge: 2017-05-26 | Disposition: A | Discharge status: Home / Self Care | Payer: Medicaid Other | Source: Ambulatory Visit | Attending: Anesthesiology | Admitting: Anesthesiology

## 2017-05-26 NOTE — Consult Note (Signed)
[name] Anesthesia Consultation  [name] DOB: [birth year] DOA: 05/26/2017 PCP: [facility], User, MD (Inactive)   Requesting physician: [facility] Date of consultation: 05/26/17    Reason for consultation: Obesity during pregnancy  CHIEF COMPLAINT:  Obesity during pregnancy  HISTORY OF PRESENT ILLNESS: Natalie Beck  is a 27 y.o. female with a known history of mild, intermittent asthma and obesity in pregnancy here to be seen prior to delivery at [facility].  PAST MEDICAL HISTORY:   Past Medical History:  Diagnosis Date  . Asthma     PAST SURGICAL HISTORY:  Past Surgical History:  Procedure Laterality Date  . dental implant    . WISDOM TOOTH EXTRACTION      SOCIAL HISTORY:  Social History   Tobacco Use  . Smoking status: Former Games developer  . Smokeless tobacco: Never Used  Substance Use Topics  . Alcohol use: No    Comment: not since pregnancy    FAMILY HISTORY:  Family History  Problem Relation Age of Onset  . Asthma Father   . Asthma Sister   . Hypertension Sister   . Rheum arthritis Paternal Grandmother   . Cancer Paternal Grandmother        lung  . Cirrhosis Maternal Grandmother   . Cancer Other        maternal great grandmother-leukemia    DRUG ALLERGIES: No Known Allergies  REVIEW OF SYSTEMS:   RESPIRATORY: No cough, shortness of breath, wheezing.  CARDIOVASCULAR: No chest pain, orthopnea, edema.  HEMATOLOGY: No anemia, easy bruising or bleeding SKIN: No rash or lesion. NEUROLOGIC: No tingling, numbness, weakness.  PSYCHIATRY: No anxiety or depression.   MEDICATIONS AT HOME:  Prior to Admission medications   Medication Sig Start Date End Date Taking? Authorizing Provider  ferrous sulfate 325 (65 FE) MG EC tablet Take 325 mg 3 (three) times daily with meals by mouth.    [provider]  Prenatal Vit-Fe Fumarate-FA (PRENATAL VITAMINS PLUS PO) Take by mouth.    [provider]      PHYSICAL EXAMINATION:   VITAL SIGNS: Last menstrual period 11/07/2016.  GENERAL:  26 y.o.-year-old patient no acute distress.  HEENT: Head atraumatic, normocephalic. Oropharynx and nasopharynx clear. MP 1, TM distance >3 cm, normal mouth opening. LUNGS: Normal breath sounds bilaterally, no wheezing, rales,rhonchi or crepitation. No use of accessory muscles of respiration.  CARDIOVASCULAR: S1, S2 normal. No murmurs, rubs, or gallops.  EXTREMITIES: No pedal edema, cyanosis, or clubbing.  NEUROLOGIC: normal gait PSYCHIATRIC: The patient is alert and oriented x 3.  SKIN: No obvious rash, lesion, or ulcer.    IMPRESSION AND PLAN:   Natalie Beck  is a 27 y.o. female presenting with obesity during pregnancy. BMI is currently 45.8 at 28 and 4/[redacted] weeks gestation. At this time patient is an acceptable candidate for delivery at [facility].   We discussed analgesic options during labor including epidural analgesia. Discussed that in obesity there can be increased difficulty with epidural placement or even failure of successful epidural. We also discussed that even after successful epidural placement there is increased risk of catheter migration out of the epidural space that would require catheter replacement. Discussed increased risk of difficult intubation during pregnancy should an emergency cesarean delivery be required.   We discussed that should the patient's BMI exceed 55 she would no longer be a candidate for delivery at [facility] and that if she should exceed a BMI of 50 it would be on a case by  case basis.

## 2017-06-06 ENCOUNTER — Ambulatory Visit (INDEPENDENT_AMBULATORY_CARE_PROVIDER_SITE_OTHER): Payer: Medicaid Other | Admitting: Certified Nurse Midwife

## 2017-06-06 ENCOUNTER — Encounter: Payer: Self-pay | Admitting: Certified Nurse Midwife

## 2017-06-06 VITALS — BP 99/57 | HR 86 | Wt 236.2 lb

## 2017-06-06 DIAGNOSIS — Z3403 Encounter for supervision of normal first pregnancy, third trimester: Secondary | ICD-10-CM

## 2017-06-06 LAB — POCT URINALYSIS DIPSTICK
BILIRUBIN UA: NEGATIVE
Glucose, UA: NEGATIVE
KETONES UA: NEGATIVE
Leukocytes, UA: NEGATIVE
Nitrite, UA: NEGATIVE
PH UA: 7.5 (ref 5.0–8.0)
Protein, UA: NEGATIVE
RBC UA: NEGATIVE
Spec Grav, UA: 1.015 (ref 1.010–1.025)
UROBILINOGEN UA: 0.2 U/dL

## 2017-06-06 NOTE — Progress Notes (Signed)
ROB, doing well . No complaints. Discussed fetal movement. Denies contractions. Follow up 2 wks.

## 2017-06-06 NOTE — Patient Instructions (Signed)

## 2017-06-06 NOTE — Progress Notes (Signed)
ROB-Pt is doing well.  

## 2017-06-17 ENCOUNTER — Ambulatory Visit (INDEPENDENT_AMBULATORY_CARE_PROVIDER_SITE_OTHER): Payer: Medicaid Other | Admitting: Certified Nurse Midwife

## 2017-06-17 VITALS — BP 105/61 | HR 75 | Wt 236.1 lb

## 2017-06-17 DIAGNOSIS — Z3403 Encounter for supervision of normal first pregnancy, third trimester: Secondary | ICD-10-CM

## 2017-06-17 LAB — POCT URINALYSIS DIPSTICK
Bilirubin, UA: NEGATIVE
Blood, UA: NEGATIVE
Glucose, UA: NEGATIVE
Ketones, UA: NEGATIVE
LEUKOCYTES UA: NEGATIVE
Nitrite, UA: NEGATIVE
PH UA: 7 (ref 5.0–8.0)
Protein, UA: NEGATIVE
Spec Grav, UA: 1.01 (ref 1.010–1.025)
UROBILINOGEN UA: 0.2 U/dL

## 2017-06-17 NOTE — Progress Notes (Signed)
ROB, doing well. Pt states that she thinks she pulled a muscle she is having pain in her left upper back below her shoulder. She states that she switched sides of the bed and the next day she noticed feeling sore. She has applied heat and used tylenol for pain . Reassurance given. Pt instructed to try ice to decrease inflammation She verbalizes understanding and agrees to plan . FHT auscultated in upper right quadrant of abdomen. Suspect she may be breech. Discussed Leopold and SVE at 36 wks to confirm fetal presentation. She verbalizes understanding. Follow up 2 wks.   Doreene BurkeAnnie Bryson Palen, CNM

## 2017-06-17 NOTE — Progress Notes (Signed)
While at work yesterday felt a pulling in upper left back- took her breath away. Now the area is tender and sore.

## 2017-06-17 NOTE — Patient Instructions (Signed)

## 2017-06-20 ENCOUNTER — Encounter: Payer: Medicaid Other | Admitting: Certified Nurse Midwife

## 2017-07-01 ENCOUNTER — Encounter: Payer: Self-pay | Admitting: Certified Nurse Midwife

## 2017-07-03 ENCOUNTER — Ambulatory Visit (INDEPENDENT_AMBULATORY_CARE_PROVIDER_SITE_OTHER): Payer: Medicaid Other | Admitting: Certified Nurse Midwife

## 2017-07-03 ENCOUNTER — Other Ambulatory Visit: Payer: Self-pay | Admitting: Certified Nurse Midwife

## 2017-07-03 VITALS — BP 105/64 | HR 101 | Wt 237.5 lb

## 2017-07-03 DIAGNOSIS — Z3403 Encounter for supervision of normal first pregnancy, third trimester: Secondary | ICD-10-CM

## 2017-07-03 DIAGNOSIS — Z3483 Encounter for supervision of other normal pregnancy, third trimester: Secondary | ICD-10-CM

## 2017-07-03 LAB — POCT URINALYSIS DIPSTICK
BILIRUBIN UA: NEGATIVE
GLUCOSE UA: NEGATIVE
Ketones, UA: NEGATIVE
Leukocytes, UA: NEGATIVE
Nitrite, UA: NEGATIVE
Protein, UA: NEGATIVE
RBC UA: NEGATIVE
SPEC GRAV UA: 1.025 (ref 1.010–1.025)
Urobilinogen, UA: 0.2 E.U./dL
pH, UA: 7 (ref 5.0–8.0)

## 2017-07-03 NOTE — Patient Instructions (Addendum)
Fetal Movement Counts Patient Name: ________________________________________________ Patient Due Date: ____________________ What is a fetal movement count? A fetal movement count is the number of times that you feel your baby move during a certain amount of time. This may also be called a fetal kick count. A fetal movement count is recommended for every pregnant woman. You may be asked to start counting fetal movements as early as week 28 of your pregnancy. Pay attention to when your baby is most active. You may notice your baby's sleep and wake cycles. You may also notice things that make your baby move more. You should do a fetal movement count:  When your baby is normally most active.  At the same time each day.  A good time to count movements is while you are resting, after having something to eat and drink. How do I count fetal movements? 1. Find a quiet, comfortable area. Sit, or lie down on your side. 2. Write down the date, the start time and stop time, and the number of movements that you felt between those two times. Take this information with you to your health care visits. 3. For 2 hours, count kicks, flutters, swishes, rolls, and jabs. You should feel at least 10 movements during 2 hours. 4. You may stop counting after you have felt 10 movements. 5. If you do not feel 10 movements in 2 hours, have something to eat and drink. Then, keep resting and counting for 1 hour. If you feel at least 4 movements during that hour, you may stop counting. Contact a health care provider if:  You feel fewer than 4 movements in 2 hours.  Your baby is not moving like he or she usually does. Date: ____________ Start time: ____________ Stop time: ____________ Movements: ____________ Date: ____________ Start time: ____________ Stop time: ____________ Movements: ____________ Date: ____________ Start time: ____________ Stop time: ____________ Movements: ____________ Date: ____________ Start time:  ____________ Stop time: ____________ Movements: ____________ Date: ____________ Start time: ____________ Stop time: ____________ Movements: ____________ Date: ____________ Start time: ____________ Stop time: ____________ Movements: ____________ Date: ____________ Start time: ____________ Stop time: ____________ Movements: ____________ Date: ____________ Start time: ____________ Stop time: ____________ Movements: ____________ Date: ____________ Start time: ____________ Stop time: ____________ Movements: ____________ This information is not intended to replace advice given to you by your health care provider. Make sure you discuss any questions you have with your health care provider. Document Released: 05/08/2006 Document Revised: 12/06/2015 Document Reviewed: 05/18/2015 Elsevier Interactive Patient Education  2018 [employer]. [employer] of the uterus can occur throughout pregnancy, but they are not always a sign that you are in labor. You may have practice contractions called Braxton Hicks contractions. These false labor contractions are sometimes confused with true labor. What are [place] contractions? Braxton Hicks contractions are tightening movements that occur in the muscles of the uterus before labor. Unlike true labor contractions, these contractions do not result in opening (dilation) and thinning of the cervix. Toward the end of pregnancy (32-34 weeks), Braxton Hicks contractions can happen more often and may become stronger. These contractions are sometimes difficult to tell apart from true labor because they can be very uncomfortable. You should not feel embarrassed if you go to the hospital with false labor. Sometimes, the only way to tell if you are in true labor is for your health care provider to look for changes in the cervix. The health care provider will do a physical exam and may monitor your contractions. If  you are not in true labor, the exam  should show that your cervix is not dilating and your water has not broken. If there are other health problems associated with your pregnancy, it is completely safe for you to be sent home with false labor. You may continue to have Braxton Hicks contractions until you go into true labor. How to tell the difference between true labor and false labor True labor  Contractions last 30-70 seconds.  Contractions become very regular.  Discomfort is usually felt in the top of the uterus, and it spreads to the lower abdomen and low back.  Contractions do not go away with walking.  Contractions usually become more intense and increase in frequency.  The cervix dilates and gets thinner. False labor  Contractions are usually shorter and not as strong as true labor contractions.  Contractions are usually irregular.  Contractions are often felt in the front of the lower abdomen and in the groin.  Contractions may go away when you walk around or change positions while lying down.  Contractions get weaker and are shorter-lasting as time goes on.  The cervix usually does not dilate or become thin. Follow these instructions at home:  Take over-the-counter and prescription medicines only as told by your health care provider.  Keep up with your usual exercises and follow other instructions from your health care provider.  Eat and drink lightly if you think you are going into labor.  If Braxton Hicks contractions are making you uncomfortable: ? Change your position from lying down or resting to walking, or change from walking to resting. ? Sit and rest in a tub of warm water. ? Drink enough fluid to keep your urine pale yellow. Dehydration may cause these contractions. ? Do slow and deep breathing several times an hour.  Keep all follow-up prenatal visits as told by your health care provider. This is important. Contact a health care provider if:  You have a fever.  You have continuous pain  in your abdomen. Get help right away if:  Your contractions become stronger, more regular, and closer together.  You have fluid leaking or gushing from your vagina.  You pass blood-tinged mucus (bloody show).  You have bleeding from your vagina.  You have low back pain that you never had before.  You feel your baby's head pushing down and causing pelvic pressure.  Your baby is not moving inside you as much as it used to. Summary  Contractions that occur before labor are called Braxton Hicks contractions, false labor, or practice contractions.  Braxton Hicks contractions are usually shorter, weaker, farther apart, and less regular than true labor contractions. True labor contractions usually become progressively stronger and regular and they become more frequent.  Manage discomfort from [facility] contractions by changing position, resting in a warm bath, drinking plenty of water, or practicing deep breathing. This information is not intended to replace advice given to you by your health care provider. Make sure you discuss any questions you have with your health care provider. Document Released: 08/22/2016 Document Revised: 08/22/2016 Document Reviewed: 08/22/2016 Elsevier Interactive Patient Education  2018 [employer]. Common Medications Safe in Pregnancy  Acne:      Constipation:  Benzoyl Peroxide     Colace  Clindamycin      Dulcolax Suppository  Topica Erythromycin     Fibercon  Salicylic Acid      Metamucil         Miralax AVOID:  Senakot   Accutane    Cough:  Retin-A       Cough Drops  Tetracycline      Phenergan w/ Codeine if Rx  Minocycline      Robitussin (Plain & DM)  Antibiotics:     Crabs/Lice:  Ceclor       RID  Cephalosporins    AVOID:  E-Mycins      Kwell  Keflex  Macrobid/Macrodantin   Diarrhea:  Penicillin      Kao-Pectate  Zithromax      Imodium AD         PUSH FLUIDS AVOID:       Cipro     Fever:  Tetracycline      Tylenol (Regular or  Extra  Minocycline       Strength)  Levaquin      Extra Strength-Do not          Exceed 8 tabs/24 hrs Caffeine:        '200mg'$ /day (equiv. To 1 cup of coffee or  approx. 3 12 oz sodas)         Gas: Cold/Hayfever:       Gas-X  Benadryl      Mylicon  Claritin       Phazyme  **Claritin-D        Chlor-Trimeton    Headaches:  Dimetapp      ASA-Free Excedrin  Drixoral-Non-Drowsy     Cold Compress  Mucinex (Guaifenasin)     Tylenol (Regular or Extra  Sudafed/Sudafed-12 Hour     Strength)  **Sudafed PE Pseudoephedrine   Tylenol Cold & Sinus     Vicks Vapor Rub  Zyrtec  **AVOID if Problems With Blood Pressure         Heartburn: Avoid lying down for at least 1 hour after meals  Aciphex      Maalox     Rash:  Milk of Magnesia     Benadryl    Mylanta       1% Hydrocortisone Cream  Pepcid  Pepcid Complete   Sleep Aids:  Prevacid      Ambien   Prilosec       Benadryl  Rolaids       Chamomile Tea  Tums (Limit 4/day)     Unisom  Zantac       Tylenol PM         Warm milk-add vanilla or  Hemorrhoids:       Sugar for taste  Anusol/Anusol H.C.  (RX: Analapram 2.5%)  Sugar Substitutes:  Hydrocortisone OTC     Ok in moderation  Preparation H      Tucks        Vaseline lotion applied to tissue with wiping    Herpes:     Throat:  Acyclovir      Oragel  Famvir  Valtrex     Vaccines:         Flu Shot Leg Cramps:       *Gardasil  Benadryl      Hepatitis A         Hepatitis B Nasal Spray:       Pneumovax  Saline Nasal Spray     Polio Booster         Tetanus Nausea:       Tuberculosis test or PPD  Vitamin B6 25 mg TID   AVOID:    Dramamine      *Gardasil  Emetrol  Live Poliovirus  Ginger Root 250 mg QID    MMR (measles, mumps &  High Complex Carbs @ Bedtime    rebella)  Sea Bands-Accupressure    Varicella (Chickenpox)  Unisom 1/2 tab TID     *No known complications           If received before Pain:         Known pregnancy;   Darvocet       Resume series  after  Lortab        Delivery  Percocet    Yeast:   Tramadol      Femstat  Tylenol 3      Gyne-lotrimin  Ultram       Monistat  Vicodin           MISC:         All Sunscreens           Hair Coloring/highlights          Insect Repellant's          (Including DEET)         Mystic Tans

## 2017-07-03 NOTE — Progress Notes (Signed)
ROB-Doing well, reports cold symptoms. Discussed home treatment measures. Wants TDaP, advised to return for injection after cold symptoms resolve. Anticipatory guidance regarding 36 week cultures and course of prenatal care. Reviewed red flag symptoms and when to call. RTC x 2 weeks for 36 week cultures and ROB or sooner if needed.

## 2017-07-03 NOTE — Progress Notes (Signed)
Pt is here for an ROB visit. Is c/o cold symptoms- taking Mucinex.

## 2017-07-05 ENCOUNTER — Inpatient Hospital Stay (HOSPITAL_COMMUNITY)
Admission: AD | Admit: 2017-07-05 | Discharge: 2017-07-05 | Disposition: A | Discharge status: Home / Self Care | Payer: Medicaid Other | Source: Ambulatory Visit | Attending: Obstetrics & Gynecology | Admitting: Obstetrics & Gynecology

## 2017-07-05 ENCOUNTER — Encounter (HOSPITAL_COMMUNITY): Payer: Self-pay | Admitting: *Deleted

## 2017-07-05 DIAGNOSIS — J4521 Mild intermittent asthma with (acute) exacerbation: Secondary | ICD-10-CM | POA: Diagnosis not present

## 2017-07-05 DIAGNOSIS — O99513 Diseases of the respiratory system complicating pregnancy, third trimester: Secondary | ICD-10-CM | POA: Insufficient documentation

## 2017-07-05 DIAGNOSIS — O99013 Anemia complicating pregnancy, third trimester: Secondary | ICD-10-CM | POA: Diagnosis not present

## 2017-07-05 DIAGNOSIS — R0602 Shortness of breath: Secondary | ICD-10-CM | POA: Diagnosis present

## 2017-07-05 DIAGNOSIS — Z87891 Personal history of nicotine dependence: Secondary | ICD-10-CM | POA: Diagnosis not present

## 2017-07-05 DIAGNOSIS — J069 Acute upper respiratory infection, unspecified: Secondary | ICD-10-CM | POA: Diagnosis not present

## 2017-07-05 DIAGNOSIS — Z3A34 34 weeks gestation of pregnancy: Secondary | ICD-10-CM | POA: Diagnosis not present

## 2017-07-05 DIAGNOSIS — D649 Anemia, unspecified: Secondary | ICD-10-CM | POA: Insufficient documentation

## 2017-07-05 DIAGNOSIS — B951 Streptococcus, group B, as the cause of diseases classified elsewhere: Secondary | ICD-10-CM

## 2017-07-05 DIAGNOSIS — O26893 Other specified pregnancy related conditions, third trimester: Secondary | ICD-10-CM | POA: Diagnosis present

## 2017-07-05 MED ORDER — ALBUTEROL SULFATE (2.5 MG/3ML) 0.083% IN NEBU
2.5000 mg | INHALATION_SOLUTION | Freq: Once | RESPIRATORY_TRACT | Status: AC
  Administered 2017-07-05: 2.5 mg via RESPIRATORY_TRACT
  Filled 2017-07-05: qty 3

## 2017-07-05 MED ORDER — BENZONATATE 100 MG PO CAPS: 100.0000 mg | ORAL_CAPSULE | Freq: Three times a day (TID) | ORAL | 0 refills | Status: DC

## 2017-07-05 NOTE — Discharge Instructions (Signed)

## 2017-07-05 NOTE — MAU Note (Addendum)
Last Sat started with cold symptoms. Cough started then. Tried Mucinex all wk. But not helping cough. Changed to Robitussin Friday and continue to cough. Inhaler is not helping. Staying tight in chest. Cough is productive and sometimes yellow/green color. 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

## 2017-07-05 NOTE — MAU Provider Note (Signed)
History     CSN: [id number]  Arrival date and time: 07/05/17 0551   First Provider Initiated Contact with Patient 07/05/17 0645     Chief Complaint  Patient presents with  . Asthma   HPI Natalie Beck is a 27 y.o. G1P0 at [redacted]w[redacted]d who presents with shortness of breath. She states she has had a cold for the last 2 weeks and over the last 24 hours, she has felt more short of breath due to coughing. She states she has been trying her inhaler with some relief but not much. She states robitussin helps with the cough. She denies any abdominal pain, leaking or vaginal bleeding. Reports good fetal movement.   OB History    Gravida Para Term Preterm AB Living   1             SAB TAB Ectopic Multiple Live Births                  Past Medical History:  Diagnosis Date  . Asthma     Past Surgical History:  Procedure Laterality Date  . dental implant    . WISDOM TOOTH EXTRACTION      Family History  Problem Relation Age of Onset  . Asthma Father   . Asthma Sister   . Hypertension Sister   . Rheum arthritis Paternal Grandmother   . Cancer Paternal Grandmother        lung  . Cirrhosis Maternal Grandmother   . Cancer Other        maternal great grandmother-leukemia    Social History   Tobacco Use  . Smoking status: Former Games developer  . Smokeless tobacco: Never Used  Substance Use Topics  . Alcohol use: No    Comment: not since pregnancy  . Drug use: No    Allergies:  Allergies  Allergen Reactions  . Citrus Dermatitis    Hypoallergenic dermatitis-tongue swells and itches    Medications Prior to Admission  Medication Sig Dispense Refill Last Dose  . albuterol (PROVENTIL HFA;VENTOLIN HFA) 108 (90 Base) MCG/ACT inhaler Inhale into the lungs every 6 (six) hours as needed for wheezing or shortness of breath.   07/05/2017 at Unknown time  . ferrous sulfate 325 (65 FE) MG EC tablet Take 325 mg 3 (three) times daily with meals by mouth.   07/04/2017 at Unknown time  . Prenatal  Vit-Fe Fumarate-FA (PRENATAL VITAMINS PLUS PO) Take by mouth.   07/04/2017 at Unknown time    Review of Systems  Constitutional: Negative.  Negative for fatigue and fever.  HENT: Negative.   Respiratory: Positive for cough and shortness of breath.   Cardiovascular: Negative.  Negative for chest pain.  Gastrointestinal: Negative.  Negative for abdominal pain, constipation, diarrhea, nausea and vomiting.  Genitourinary: Negative.  Negative for dysuria.  Neurological: Negative.  Negative for dizziness and headaches.   Physical Exam   Blood pressure 113/74, pulse 99, temperature 98.8 F (37.1 C), resp. rate (!) 24, height 5' (1.524 m), weight 238 lb (108 kg), last menstrual period 11/07/2016, SpO2 100 %.  Physical Exam  Nursing note and vitals reviewed. Constitutional: She is oriented to person, place, and time. She appears well-developed and well-nourished. No distress.  HENT:  Head: Normocephalic.  Eyes: Pupils are equal, round, and reactive to light.  Cardiovascular: Normal rate, regular rhythm and normal heart sounds.  Respiratory: Effort normal and breath sounds normal. No respiratory distress. She has no decreased breath sounds. She has no wheezes. She has no  rales.  GI: Soft. Bowel sounds are normal. She exhibits no distension. There is no tenderness.  Neurological: She is alert and oriented to person, place, and time.  Skin: Skin is warm and dry.  Psychiatric: She has a normal mood and affect. Her behavior is normal. Judgment and thought content normal.   Fetal Tracing:  Baseline: 155 Variability: moderate Accels: 15x15 Decels: none  Toco: none  MAU Course  Procedures  MDM Albuterol nebulizer Patient reports complete resolution of symptoms  Assessment and Plan   1. Mild intermittent asthma with exacerbation   2. Anemia affecting pregnancy in third trimester   3. Group beta Strep positive   4. [redacted] weeks gestation of pregnancy   5. Viral upper respiratory tract  infection    -Discharge home in stable condition -Rx for tessalon perles sent to patient's pharmacy -Safe medications in pregnancy for cough and cold discussed -Patient advised to follow-up with OB as scheduled for prenatal care -Patient may return to MAU as needed or if her condition were to change or worsen  Rolm BookbinderCaroline M Neill CNM 07/05/2017, 6:50 AM

## 2017-07-06 ENCOUNTER — Encounter: Payer: Self-pay | Admitting: *Deleted

## 2017-07-06 ENCOUNTER — Observation Stay
Admission: EM | Admit: 2017-07-06 | Discharge: 2017-07-06 | Disposition: A | Discharge status: Home / Self Care | Payer: Medicaid Other | Attending: Certified Nurse Midwife | Admitting: Certified Nurse Midwife

## 2017-07-06 ENCOUNTER — Other Ambulatory Visit: Payer: Self-pay

## 2017-07-06 DIAGNOSIS — O99013 Anemia complicating pregnancy, third trimester: Secondary | ICD-10-CM

## 2017-07-06 DIAGNOSIS — O368130 Decreased fetal movements, third trimester, not applicable or unspecified: Principal | ICD-10-CM | POA: Insufficient documentation

## 2017-07-06 DIAGNOSIS — Z79899 Other long term (current) drug therapy: Secondary | ICD-10-CM | POA: Insufficient documentation

## 2017-07-06 DIAGNOSIS — Z3A34 34 weeks gestation of pregnancy: Secondary | ICD-10-CM

## 2017-07-06 DIAGNOSIS — O99213 Obesity complicating pregnancy, third trimester: Secondary | ICD-10-CM | POA: Diagnosis not present

## 2017-07-06 DIAGNOSIS — Z3490 Encounter for supervision of normal pregnancy, unspecified, unspecified trimester: Secondary | ICD-10-CM

## 2017-07-06 DIAGNOSIS — E669 Obesity, unspecified: Secondary | ICD-10-CM | POA: Insufficient documentation

## 2017-07-06 DIAGNOSIS — B951 Streptococcus, group B, as the cause of diseases classified elsewhere: Secondary | ICD-10-CM

## 2017-07-06 MED ORDER — ACETAMINOPHEN 500 MG PO TABS
1000.0000 mg | ORAL_TABLET | Freq: Four times a day (QID) | ORAL | Status: DC | PRN
  Administered 2017-07-06: 1000 mg via ORAL
  Filled 2017-07-06: qty 2

## 2017-07-06 NOTE — Discharge Instructions (Signed)
Please keep your next scheduled appointment.  If you have any questions or concerns please call your on-call provider.  You may also call the nurse's desk at [facility] at [phone] for questions.  If you have urgent questions or concerns please go to your nearest emergency department for evaluation.  You may take extra strength tylenol 1000 mg at one time for headache pain.  Do not exceed 4 grams in 24 hours.   Keep hydrated drinking plenty of water.

## 2017-07-06 NOTE — OB Triage Note (Signed)
Patient to OBS4 for complaint of decreased fetal movement.  She has been running fever since Saturday.  She expresses that she feels her fever started after starting her cough medication.  No complaints of LOF, bleeding or contractions.  She rates her headache as 8/10 in her temples and behind her eyes.  Patient has asthma and has been using her albuterol inhaler.  [facility].

## 2017-07-08 ENCOUNTER — Ambulatory Visit (INDEPENDENT_AMBULATORY_CARE_PROVIDER_SITE_OTHER): Payer: Medicaid Other | Admitting: Obstetrics and Gynecology

## 2017-07-08 DIAGNOSIS — Z23 Encounter for immunization: Secondary | ICD-10-CM

## 2017-07-08 MED ORDER — TETANUS-DIPHTH-ACELL PERTUSSIS 5-2.5-18.5 LF-MCG/0.5 IM SUSP
0.5000 mL | Freq: Once | INTRAMUSCULAR | Status: AC
  Administered 2017-07-08: 0.5 mL via INTRAMUSCULAR

## 2017-07-08 NOTE — Progress Notes (Signed)
Pt is here for her tdap injection, tolerated well

## 2017-07-09 MED ORDER — TERCONAZOLE 0.4 % VA CREA: 1.0000 | TOPICAL_CREAM | Freq: Every day | VAGINAL | 0 refills | Status: DC

## 2017-07-09 NOTE — Discharge Summary (Signed)
Obstetric Discharge Summary  Patient ID: Natalie FillerChelci A Beck MRN: [id number] DOB/AGE: 1990-09-11 27 y.o.   Date of Admission: 07/06/2017 Natalie Beck, CNM Priscille Loveless(M. DeFrancesco, MD)  Date of Discharge: 07/06/2017 Natalie Beck, CNM Judie Petit(M. DeFrancesco, MD)  Admitting Diagnosis: Observation at 3662w3d  Secondary Diagnosis: Obesity in pregnancy     Discharge Diagnosis: No other diagnosis   Antepartum Procedures: NST   Brief Hospital Course   L&D OB Triage Note  Natalie Beck is a 27 y.o. G1P0 female at 6862w3d, EDD Estimated Date of Delivery: 08/14/17 who presented to triage for complaints of decreased fetal movement.  She was evaluated by the nurses with no significant findings for preterm labor or fetal distress. Vital signs stable. An NST was performed and has been reviewed by CNM.    NST INTERPRETATION: Indications: decreased fetal movement  Mode: External(monitors discontinued for discharge) Baseline Rate (A): 158 bpm(fhr at this time, baseline 155) Variability: Moderate Accelerations: 15 x 15 Decelerations: None Nonstress Test Interpretation: Reactive   Contraction Frequency (min): one(1158)   Plan: NST performed was reviewed and was found to be reactive. She was discharged home with bleeding/labor precautions.  Continue routine prenatal care. Follow up with CNM as previously scheduled.    Discharge Instructions: Per After Visit Summary.  Activity:  Also refer to After Visit Summary  Diet: Regular  Medications: Allergies as of 07/06/2017      Reactions   Citrus Dermatitis   Hypoallergenic dermatitis-tongue swells and itches      Medication List    ASK your doctor about these medications   albuterol 108 (90 Base) MCG/ACT inhaler Commonly known as:  PROVENTIL HFA;VENTOLIN HFA Inhale into the lungs every 6 (six) hours as needed for wheezing or shortness of breath.   benzonatate 100 MG capsule Commonly known as:  TESSALON Take 1 capsule (100 mg total) by mouth  every 8 (eight) hours.   dextromethorphan-guaiFENesin 30-600 MG 12hr tablet Commonly known as:  MUCINEX DM Take 1 tablet by mouth 2 (two) times daily.   ferrous sulfate 325 (65 FE) MG EC tablet Take 325 mg 3 (three) times daily with meals by mouth.   PRENATAL VITAMINS PLUS PO Take by mouth.      Outpatient follow up:   Discharged Condition: stable  Discharged to: home   Gunnar BullaJenkins Natalie Martie Fulgham, CNM [facility]

## 2017-07-16 ENCOUNTER — Ambulatory Visit (INDEPENDENT_AMBULATORY_CARE_PROVIDER_SITE_OTHER): Payer: Medicaid Other

## 2017-07-16 ENCOUNTER — Ambulatory Visit (INDEPENDENT_AMBULATORY_CARE_PROVIDER_SITE_OTHER): Payer: Medicaid Other | Admitting: Obstetrics and Gynecology

## 2017-07-16 VITALS — BP 100/73 | HR 92 | Wt 237.3 lb

## 2017-07-16 DIAGNOSIS — B3731 Acute candidiasis of vulva and vagina: Secondary | ICD-10-CM

## 2017-07-16 DIAGNOSIS — Z3483 Encounter for supervision of other normal pregnancy, third trimester: Secondary | ICD-10-CM

## 2017-07-16 DIAGNOSIS — Z3493 Encounter for supervision of normal pregnancy, unspecified, third trimester: Secondary | ICD-10-CM

## 2017-07-16 DIAGNOSIS — Z3685 Encounter for antenatal screening for Streptococcus B: Secondary | ICD-10-CM

## 2017-07-16 DIAGNOSIS — Z113 Encounter for screening for infections with a predominantly sexual mode of transmission: Secondary | ICD-10-CM

## 2017-07-16 DIAGNOSIS — D649 Anemia, unspecified: Secondary | ICD-10-CM

## 2017-07-16 DIAGNOSIS — B373 Candidiasis of vulva and vagina: Secondary | ICD-10-CM

## 2017-07-16 LAB — POCT URINALYSIS DIPSTICK
BILIRUBIN UA: NEGATIVE
Blood, UA: NEGATIVE
Glucose, UA: NEGATIVE
KETONES UA: NEGATIVE
Nitrite, UA: NEGATIVE
Spec Grav, UA: 1.015 (ref 1.010–1.025)
Urobilinogen, UA: 0.2 E.U./dL
pH, UA: 6.5 (ref 5.0–8.0)

## 2017-07-16 MED ORDER — TERCONAZOLE 0.4 % VA CREA
1.0000 | TOPICAL_CREAM | Freq: Every day | VAGINAL | 0 refills | Status: DC
Start: 2017-07-16 — End: 2017-07-24

## 2017-07-16 NOTE — Progress Notes (Signed)
ROB & growth scan due to BMI; reviewed scan and normal, reports increased increase pressure. 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 Discussed HBC, plans condoms. Reports increased irritation of vaginal area- started a few days ago, did shave yesterday and symptoms got worse. Discussed GBS, Pelvic exam: VULVA: vulvar excoriation &, vulvar erythema throughout, VAGINA: normal appearing vagina with normal color and discharge, no lesions, vaginal discharge - copious and green, CERVIX: normal appearing cervix without discharge or lesions.   Indications: Growth Findings:  Singleton intrauterine pregnancy is visualized with FHR at 155 BPM. Biometrics give an (U/S) Gestational age of 27 3/7 weeks and an (U/S) EDD of 08/17/17; this correlates with the clinically established EDD of 08/14/17.  Fetal presentation is vertex.  EFW: 2679 grams (5lb 15oz).  40th percentile. Placenta: Posterior fundal and grade 2-3. AFI: 10.4 cm.  Fetal stomach, kidneys, and bladder appear WNL.  Impression: 1. 35 3/7 week Viable Singleton Intrauterine pregnancy by U/S. 2. (U/S) EDD is consistent with Clinically established (LMP) EDD of 08/14/17. 3. EFW: 2679 grams (5lb 15oz).  40th percentile.

## 2017-07-16 NOTE — Progress Notes (Signed)
ROB-growth scan done today, cultures obtained, pt is c/o vaginal itching- states she never got the terazol cream

## 2017-07-17 LAB — CBC
Hematocrit: 35.5 % (ref 34.0–46.6)
Hemoglobin: 10.9 g/dL — ABNORMAL LOW (ref 11.1–15.9)
MCH: 27.3 pg (ref 26.6–33.0)
MCHC: 30.7 g/dL — AB (ref 31.5–35.7)
MCV: 89 fL (ref 79–97)
PLATELETS: 202 10*3/uL (ref 150–379)
RBC: 4 x10E6/uL (ref 3.77–5.28)
RDW: 19.1 % — ABNORMAL HIGH (ref 12.3–15.4)
WBC: 11.5 10*3/uL — ABNORMAL HIGH (ref 3.4–10.8)

## 2017-07-17 LAB — FERRITIN: FERRITIN: 7 ng/mL — AB (ref 15–150)

## 2017-07-17 LAB — B12 AND FOLATE PANEL
Folate: >20 ng/mL (ref >3.0)
Vitamin B-12: 563 pg/mL (ref 232–1245)

## 2017-07-18 LAB — GC/CHLAMYDIA PROBE AMP
CHLAMYDIA, DNA PROBE: NEGATIVE
NEISSERIA GONORRHOEAE BY PCR: NEGATIVE

## 2017-07-24 ENCOUNTER — Ambulatory Visit (INDEPENDENT_AMBULATORY_CARE_PROVIDER_SITE_OTHER): Payer: Medicaid Other | Admitting: Certified Nurse Midwife

## 2017-07-24 VITALS — BP 103/67 | HR 87 | Wt 241.4 lb

## 2017-07-24 DIAGNOSIS — Z3493 Encounter for supervision of normal pregnancy, unspecified, third trimester: Secondary | ICD-10-CM | POA: Diagnosis not present

## 2017-07-24 LAB — POCT URINALYSIS DIPSTICK
BILIRUBIN UA: NEGATIVE
GLUCOSE UA: NEGATIVE
Ketones, UA: NEGATIVE
LEUKOCYTES UA: NEGATIVE
Nitrite, UA: NEGATIVE
Protein, UA: NEGATIVE
RBC UA: NEGATIVE
Spec Grav, UA: 1.02 (ref 1.010–1.025)
UROBILINOGEN UA: 0.2 U/dL
pH, UA: 6.5 (ref 5.0–8.0)

## 2017-07-24 NOTE — Patient Instructions (Addendum)
Vaginal Delivery Vaginal delivery means that you will give birth by pushing your baby out of your birth canal (vagina). A team of health care providers will help you before, during, and after vaginal delivery. Birth experiences are unique for every woman and every pregnancy, and birth experiences vary depending on where you choose to give birth. What should I do to prepare for my baby's birth? Before your baby is born, it is important to talk with your health care provider about:  Your labor and delivery preferences. These may include: ? Medicines that you may be given. ? How you will manage your pain. This might include non-medical pain relief techniques or injectable pain relief such as epidural analgesia. ? How you and your baby will be monitored during labor and delivery. ? Who may be in the labor and delivery room with you. ? Your feelings about surgical delivery of your baby (cesarean delivery, or C-section) if this becomes necessary. ? Your feelings about receiving donated blood through an IV tube (blood transfusion) if this becomes necessary.  Whether you are able: ? To take pictures or videos of the birth. ? To eat during labor and delivery. ? To move around, walk, or change positions during labor and delivery.  What to expect after your baby is born, such as: ? Whether delayed umbilical cord clamping and cutting is offered. ? Who will care for your baby right after birth. ? Medicines or tests that may be recommended for your baby. ? Whether breastfeeding is supported in your hospital or birth center. ? How long you will be in the hospital or birth center.  How any medical conditions you have may affect your baby or your labor and delivery experience.  To prepare for your baby's birth, you should also:  Attend all of your health care visits before delivery (prenatal visits) as recommended by your health care provider. This is important.  Prepare your home for your baby's  arrival. Make sure that you have: ? Diapers. ? Baby clothing. ? Feeding equipment. ? Safe sleeping arrangements for you and your baby.  Install a car seat in your vehicle. Have your car seat checked by a certified car seat installer to make sure that it is installed safely.  Think about who will help you with your new baby at home for at least the first several weeks after delivery.  What can I expect when I arrive at the birth center or hospital? Once you are in labor and have been admitted into the hospital or birth center, your health care provider may:  Review your pregnancy history and any concerns you have.  Insert an IV tube into one of your veins. This is used to give you fluids and medicines.  Check your blood pressure, pulse, temperature, and heart rate (vital signs).  Check whether your bag of water (amniotic sac) has broken (ruptured).  Talk with you about your birth plan and discuss pain control options.  Monitoring Your health care provider may monitor your contractions (uterine monitoring) and your baby's heart rate (fetal monitoring). You may need to be monitored:  Often, but not continuously (intermittently).  All the time or for long periods at a time (continuously). Continuous monitoring may be needed if: ? You are taking certain medicines, such as medicine to relieve pain or make your contractions stronger. ? You have pregnancy or labor complications.  Monitoring may be done by:  Placing a special stethoscope or a handheld monitoring device on your abdomen to   check your baby's heartbeat, and feeling your abdomen for contractions. This method of monitoring does not continuously record your baby's heartbeat or your contractions.  Placing monitors on your abdomen (external monitors) to record your baby's heartbeat and the frequency and length of contractions. You may not have to wear external monitors all the time.  Placing monitors inside of your uterus  (internal monitors) to record your baby's heartbeat and the frequency, length, and strength of your contractions. ? Your health care provider may use internal monitors if he or she needs more information about the strength of your contractions or your baby's heart rate. ? Internal monitors are put in place by passing a thin, flexible wire through your vagina and into your uterus. Depending on the type of monitor, it may remain in your uterus or on your baby's head until birth. ? Your health care provider will discuss the benefits and risks of internal monitoring with you and will ask for your permission before inserting the monitors.  Telemetry. This is a type of continuous monitoring that can be done with external or internal monitors. Instead of having to stay in bed, you are able to move around during telemetry. Ask your health care provider if telemetry is an option for you.  Physical exam Your health care provider may perform a physical exam. This may include:  Checking whether your baby is positioned: ? With the head toward your vagina (head-down). This is most common. ? With the head toward the top of your uterus (head-up or breech). If your baby is in a breech position, your health care provider may try to turn your baby to a head-down position so you can deliver vaginally. If it does not seem that your baby can be born vaginally, your provider may recommend surgery to deliver your baby. In rare cases, you may be able to deliver vaginally if your baby is head-up (breech delivery). ? Lying sideways (transverse). Babies that are lying sideways cannot be delivered vaginally.  Checking your cervix to determine: ? Whether it is thinning out (effacing). ? Whether it is opening up (dilating). ? How low your baby has moved into your birth canal.  What are the three stages of labor and delivery?  Normal labor and delivery is divided into the following three stages: Stage 1  Stage 1 is the  longest stage of labor, and it can last for hours or days. Stage 1 includes: ? Early labor. This is when contractions may be irregular, or regular and mild. Generally, early labor contractions are more than 10 minutes apart. ? Active labor. This is when contractions get longer, more regular, more frequent, and more intense. ? The transition phase. This is when contractions happen very close together, are very intense, and may last longer than during any other part of labor.  Contractions generally feel mild, infrequent, and irregular at first. They get stronger, more frequent (about every 2-3 minutes), and more regular as you progress from early labor through active labor and transition.  Many women progress through stage 1 naturally, but you may need help to continue making progress. If this happens, your health care provider may talk with you about: ? Rupturing your amniotic sac if it has not ruptured yet. ? Giving you medicine to help make your contractions stronger and more frequent.  Stage 1 ends when your cervix is completely dilated to 4 inches (10 cm) and completely effaced. This happens at the end of the transition phase. Stage 2  Once   your cervix is completely effaced and dilated to 4 inches (10 cm), you may start to feel an urge to push. It is common for the body to naturally take a rest before feeling the urge to push, especially if you received an epidural or certain other pain medicines. This rest period may last for up to 1-2 hours, depending on your unique labor experience.  During stage 2, contractions are generally less painful, because pushing helps relieve contraction pain. Instead of contraction pain, you may feel stretching and burning pain, especially when the widest part of your baby's head passes through the vaginal opening (crowning).  Your health care provider will closely monitor your pushing progress and your baby's progress through the vagina during stage 2.  Your  health care provider may massage the area of skin between your vaginal opening and anus (perineum) or apply warm compresses to your perineum. This helps it stretch as the baby's head starts to crown, which can help prevent perineal tearing. ? In some cases, an incision may be made in your perineum (episiotomy) to allow the baby to pass through the vaginal opening. An episiotomy helps to make the opening of the vagina larger to allow more room for the baby to fit through.  It is very important to breathe and focus so your health care provider can control the delivery of your baby's head. Your health care provider may have you decrease the intensity of your pushing, to help prevent perineal tearing.  After delivery of your baby's head, the shoulders and the rest of the body generally deliver very quickly and without difficulty.  Once your baby is delivered, the umbilical cord may be cut right away, or this may be delayed for 1-2 minutes, depending on your baby's health. This may vary among health care providers, hospitals, and birth centers.  If you and your baby are healthy enough, your baby may be placed on your chest or abdomen to help maintain the baby's temperature and to help you bond with each other. Some mothers and babies start breastfeeding at this time. Your health care team will dry your baby and help keep your baby warm during this time.  Your baby may need immediate care if he or she: ? Showed signs of distress during labor. ? Has a medical condition. ? Was born too early (prematurely). ? Had a bowel movement before birth (meconium). ? Shows signs of difficulty transitioning from being inside the uterus to being outside of the uterus. If you are planning to breastfeed, your health care team will help you begin a feeding. Stage 3  The third stage of labor starts immediately after the birth of your baby and ends after you deliver the placenta. The placenta is an organ that develops  during pregnancy to provide oxygen and nutrients to your baby in the womb.  Delivering the placenta may require some pushing, and you may have mild contractions. Breastfeeding can stimulate contractions to help you deliver the placenta.  After the placenta is delivered, your uterus should tighten (contract) and become firm. This helps to stop bleeding in your uterus. To help your uterus contract and to control bleeding, your health care provider may: ? Give you medicine by injection, through an IV tube, by mouth, or through your rectum (rectally). ? Massage your abdomen or perform a vaginal exam to remove any blood clots that are left in your uterus. ? Empty your bladder by placing a thin, flexible tube (catheter) into your bladder. ? Encourage   you to breastfeed your baby. After labor is over, you and your baby will be monitored closely to ensure that you are both healthy until you are ready to go home. Your health care team will teach you how to care for yourself and your baby. This information is not intended to replace advice given to you by your health care provider. Make sure you discuss any questions you have with your health care provider. Document Released: 01/16/2008 Document Revised: 10/27/2015 Document Reviewed: 04/23/2015 Elsevier Interactive Patient Education  2018 [employer] [facility] List   [facility]  [address], [place], [place] [address]  Phone: [phone]) [phone]   [facility] ([facility])  [address] [place], [place] [address]  Phone: [phone]   [facility]) [address], [place], [place] [address] Phone: [phone]   [facility]  [address] [place], [place] [address]  Phone: [phone]

## 2017-07-24 NOTE — Progress Notes (Addendum)
ROB-Reports increased pelvic pressure and "clicking on bones". Discussed home treatment measures. Last day of work next Friday. Advised to send Waterbirth Class completion certificate via MyChart. Local Pediatrician list given. Reviewed red flag symptoms and when to call. RTC x 1 week for ROB or sooner if needed.

## 2017-08-01 ENCOUNTER — Ambulatory Visit (INDEPENDENT_AMBULATORY_CARE_PROVIDER_SITE_OTHER): Payer: Medicaid Other | Admitting: Obstetrics and Gynecology

## 2017-08-01 VITALS — BP 111/66 | HR 98 | Wt 243.5 lb

## 2017-08-01 DIAGNOSIS — Z3493 Encounter for supervision of normal pregnancy, unspecified, third trimester: Secondary | ICD-10-CM | POA: Diagnosis not present

## 2017-08-01 LAB — POCT URINALYSIS DIPSTICK
Bilirubin, UA: NEGATIVE
Glucose, UA: NEGATIVE
Ketones, UA: NEGATIVE
Nitrite, UA: NEGATIVE
PH UA: 7.5 (ref 5.0–8.0)
RBC UA: NEGATIVE
Spec Grav, UA: 1.01 (ref 1.010–1.025)
UROBILINOGEN UA: 0.2 U/dL

## 2017-08-01 NOTE — Progress Notes (Signed)
ROB- pt is having pelvic pressure 

## 2017-08-01 NOTE — Progress Notes (Signed)
ROB- doing well,will do ultrasound next visit to determine presentation and EFW. Discussed her narrow pelvis and lack of fetal engagement and short stature- increasing risk for operative delivering.

## 2017-08-11 ENCOUNTER — Encounter: Payer: Self-pay | Admitting: Certified Nurse Midwife

## 2017-08-11 ENCOUNTER — Ambulatory Visit (INDEPENDENT_AMBULATORY_CARE_PROVIDER_SITE_OTHER): Payer: Medicaid Other | Admitting: Certified Nurse Midwife

## 2017-08-11 ENCOUNTER — Other Ambulatory Visit: Payer: Self-pay | Admitting: Certified Nurse Midwife

## 2017-08-11 ENCOUNTER — Ambulatory Visit (INDEPENDENT_AMBULATORY_CARE_PROVIDER_SITE_OTHER): Payer: Medicaid Other

## 2017-08-11 VITALS — BP 103/69 | HR 72 | Wt 243.6 lb

## 2017-08-11 DIAGNOSIS — Z3493 Encounter for supervision of normal pregnancy, unspecified, third trimester: Secondary | ICD-10-CM

## 2017-08-11 LAB — POCT URINALYSIS DIPSTICK
Bilirubin, UA: NEGATIVE
Blood, UA: NEGATIVE
Glucose, UA: NEGATIVE
KETONES UA: NEGATIVE
Leukocytes, UA: NEGATIVE
NITRITE UA: NEGATIVE
PH UA: 5 (ref 5.0–8.0)
PROTEIN UA: NEGATIVE
Spec Grav, UA: 1.02 (ref 1.010–1.025)
UROBILINOGEN UA: 0.2 U/dL

## 2017-08-11 NOTE — Progress Notes (Signed)
Pt is here for an ROB visit. 

## 2017-08-11 NOTE — Progress Notes (Signed)
ROB, doing well. Feels good movement. U/s today for presentation and EFW. See below. SVe per pt request. Cervix difficlult to reach. Unable to feel presenting fetal part. Vertex per U/s. Discussed induction . She would like to hold out as long as possible . She desires waterbirth and natural labor. Reviewed benefits and risk of placenta aging in relation to fetal well being. She verbalizes understanding . She will discuss with her partner but would like to wait until closer to 42 wks before inducting. Follow up 1 wk for BPP and ROB.   Doreene BurkeAnnie Amie Cowens, CNM    ULTRASOUND REPORT  Location: [facility] Date of Service:  08/11/2017  Indications: Growth Findings:  Singleton intrauterine pregnancy is visualized with FHR at 131 BPM. Biometrics give an (U/S) Gestational age of [redacted] weeks and an (U/S) EDD of 09/01/17; this correlates with the clinically established EDD of 08/14/17.  Fetal presentation is vertex.  EFW: 3202 grams (7lb 1oz).  33rd percentile.  BPD measures 13th, HC measures 1st, AC measures 26th, and FL measures 11th percentile. Placenta: Posterior fundal and grade 3. AFI: 6.6 cm.  Fetal stomach, kidneys, and bladder appear WNL.  Impression: 1. 37 week Viable Singleton Intrauterine pregnancy by U/S. 2. (U/S) EDD is consistent with Clinically established (LMP) EDD of 08/14/17. 3. EFW: 3202 grams (7lb 1oz).  33rd percentile.  BPD measures 13th, HC measures 1st, AC measures 26th, and FL measures 11th percentile.  Recommendations: 1.Clinical correlation with the patient's History and Physical Exam.   Kari BaarsJill Long, RDMS

## 2017-08-11 NOTE — Patient Instructions (Signed)
Braxton Hicks Contractions °Contractions of the uterus can occur throughout pregnancy, but they are not always a sign that you are in labor. You may have practice contractions called Braxton Hicks contractions. These false labor contractions are sometimes confused with true labor. °What are Braxton Hicks contractions? °Braxton Hicks contractions are tightening movements that occur in the muscles of the uterus before labor. Unlike true labor contractions, these contractions do not result in opening (dilation) and thinning of the cervix. Toward the end of pregnancy (32-34 weeks), Braxton Hicks contractions can happen more often and may become stronger. These contractions are sometimes difficult to tell apart from true labor because they can be very uncomfortable. You should not feel embarrassed if you go to the hospital with false labor. °Sometimes, the only way to tell if you are in true labor is for your health care provider to look for changes in the cervix. The health care provider will do a physical exam and may monitor your contractions. If you are not in true labor, the exam should show that your cervix is not dilating and your water has not broken. °If there are other health problems associated with your pregnancy, it is completely safe for you to be sent home with false labor. You may continue to have Braxton Hicks contractions until you go into true labor. °How to tell the difference between true labor and false labor °True labor °· Contractions last 30-70 seconds. °· Contractions become very regular. °· Discomfort is usually felt in the top of the uterus, and it spreads to the lower abdomen and low back. °· Contractions do not go away with walking. °· Contractions usually become more intense and increase in frequency. °· The cervix dilates and gets thinner. °False labor °· Contractions are usually shorter and not as strong as true labor contractions. °· Contractions are usually irregular. °· Contractions  are often felt in the front of the lower abdomen and in the groin. °· Contractions may go away when you walk around or change positions while lying down. °· Contractions get weaker and are shorter-lasting as time goes on. °· The cervix usually does not dilate or become thin. °Follow these instructions at home: °· Take over-the-counter and prescription medicines only as told by your health care provider. °· Keep up with your usual exercises and follow other instructions from your health care provider. °· Eat and drink lightly if you think you are going into labor. °· If Braxton Hicks contractions are making you uncomfortable: °? Change your position from lying down or resting to walking, or change from walking to resting. °? Sit and rest in a tub of warm water. °? Drink enough fluid to keep your urine pale yellow. Dehydration may cause these contractions. °? Do slow and deep breathing several times an hour. °· Keep all follow-up prenatal visits as told by your health care provider. This is important. °Contact a health care provider if: °· You have a fever. °· You have continuous pain in your abdomen. °Get help right away if: °· Your contractions become stronger, more regular, and closer together. °· You have fluid leaking or gushing from your vagina. °· You pass blood-tinged mucus (bloody show). °· You have bleeding from your vagina. °· You have low back pain that you never had before. °· You feel your baby’s head pushing down and causing pelvic pressure. °· Your baby is not moving inside you as much as it used to. °Summary °· Contractions that occur before labor are called Braxton   Hicks contractions, false labor, or practice contractions. °· Braxton Hicks contractions are usually shorter, weaker, farther apart, and less regular than true labor contractions. True labor contractions usually become progressively stronger and regular and they become more frequent. °· Manage discomfort from Braxton Hicks contractions by  changing position, resting in a warm bath, drinking plenty of water, or practicing deep breathing. °This information is not intended to replace advice given to you by your health care provider. Make sure you discuss any questions you have with your health care provider. °Document Released: 08/22/2016 Document Revised: 08/22/2016 Document Reviewed: 08/22/2016 °Elsevier Interactive Patient Education © 2018 Elsevier Inc. ° °

## 2017-08-14 ENCOUNTER — Other Ambulatory Visit: Payer: Self-pay | Admitting: Certified Nurse Midwife

## 2017-08-14 DIAGNOSIS — O480 Post-term pregnancy: Secondary | ICD-10-CM

## 2017-08-16 ENCOUNTER — Observation Stay
Admission: EM | Admit: 2017-08-16 | Discharge: 2017-08-16 | Disposition: A | Discharge status: Home / Self Care | Payer: Medicaid Other | Source: Home / Self Care | Admitting: Obstetrics and Gynecology

## 2017-08-16 ENCOUNTER — Other Ambulatory Visit: Payer: Self-pay

## 2017-08-16 DIAGNOSIS — O471 False labor at or after 37 completed weeks of gestation: Secondary | ICD-10-CM | POA: Insufficient documentation

## 2017-08-16 DIAGNOSIS — Z3A40 40 weeks gestation of pregnancy: Secondary | ICD-10-CM

## 2017-08-16 DIAGNOSIS — B951 Streptococcus, group B, as the cause of diseases classified elsewhere: Secondary | ICD-10-CM

## 2017-08-16 DIAGNOSIS — O26893 Other specified pregnancy related conditions, third trimester: Secondary | ICD-10-CM | POA: Diagnosis not present

## 2017-08-16 DIAGNOSIS — O99013 Anemia complicating pregnancy, third trimester: Secondary | ICD-10-CM

## 2017-08-16 HISTORY — DX: Anemia, unspecified: D64.9

## 2017-08-16 MED ORDER — ONDANSETRON HCL 4 MG/2ML IJ SOLN: 4.0000 mg | Freq: Four times a day (QID) | INTRAMUSCULAR | Status: DC | PRN

## 2017-08-16 MED ORDER — ACETAMINOPHEN 325 MG PO TABS: 650.0000 mg | ORAL_TABLET | ORAL | Status: DC | PRN

## 2017-08-16 MED ORDER — SOD CITRATE-CITRIC ACID 500-334 MG/5ML PO SOLN: 30.0000 mL | ORAL | Status: DC | PRN

## 2017-08-16 NOTE — OB Triage Note (Signed)
Pt reports to triage with c/o contractions since 8am and they have been "back to back".  Pt reports + FM and denies LOF or VB.

## 2017-08-16 NOTE — Progress Notes (Signed)
Spoke with [name], CNM regarding SVE (2/50-60/-3) and fetal tracing. Initial NST reactive, baseline 140s, positive for accelerations, no decels. Pt had several variables after second SVE while positioned on her back. Patient repositioned left tilt. CNM okay to discharge aptient home if another reactive fetal tracing obtained. Plan to continue assessing another approximately 20 minutes.

## 2017-08-16 NOTE — Progress Notes (Addendum)
Discussed plan to recheck pt at 215, 2 hours after last SVE to see if she is making cervical change and discuss POC with pt and CNM. Pt walking and using birthing ball until then 2pm when RN will reapply monitors. Pt OK with plan, denies additional needs and questions at this time.

## 2017-08-16 NOTE — OB Triage Note (Signed)
   L&D OB Triage Note  SUBJECTIVE Natalie Beck is a 27 y.o. G1P0 female at [redacted]w[redacted]d, EDD Estimated Date of Delivery: 08/14/17 who presented to triage with complaints of contractions. She denies LOF and vaginal bleeding. She feels good fetal movement.   OB History  Gravida Para Term Preterm AB Living  1 0 0 0 0 0  SAB TAB Ectopic Multiple Live Births  0 0 0 0 0    # Outcome Date GA Lbr Len/2nd Weight Sex Delivery Anes PTL Lv  1 Current             No medications prior to admission.     OBJECTIVE  Nursing Evaluation:   BP 129/73   Pulse 73   Temp 97.9 F (36.6 C) (Oral)   Resp 20   LMP 11/07/2016 (Exact Date)    Findings:   False labor  NST was performed and has been reviewed by me.  NST INTERPRETATION: Category I  Mode: External Baseline Rate (A): 135 bpm Variability: Moderate Accelerations: 15 x 15 Decelerations: Variable while on her back during vaginal exam. Otherwise no decelerations.      Contraction Frequency (min): 2-6  ASSESSMENT Impression:  1.  Pregnancy:  G1P0 at [redacted]w[redacted]d , EDD Estimated Date of Delivery: 08/14/17 2.  NST:  Reactive NST  PLAN 1. Reassurance given 2. Discharge home with standard labor precautions given to return to L&D or call the office for problems. 3. Continue routine prenatal care.  [name], CNM

## 2017-08-17 ENCOUNTER — Encounter
Admission: EM | Disposition: A | Discharge status: Home / Self Care | Payer: Self-pay | Source: Home / Self Care | Attending: Certified Nurse Midwife

## 2017-08-17 ENCOUNTER — Inpatient Hospital Stay: Payer: Medicaid Other | Admitting: Anesthesiology

## 2017-08-17 ENCOUNTER — Inpatient Hospital Stay
Admission: EM | Admit: 2017-08-17 | Discharge: 2017-08-19 | DRG: 788 | Disposition: A | Discharge status: Home / Self Care | Payer: Medicaid Other | Attending: Certified Nurse Midwife | Admitting: Certified Nurse Midwife

## 2017-08-17 ENCOUNTER — Encounter: Payer: Self-pay | Admitting: Anesthesiology

## 2017-08-17 DIAGNOSIS — Z3A40 40 weeks gestation of pregnancy: Secondary | ICD-10-CM

## 2017-08-17 DIAGNOSIS — O99214 Obesity complicating childbirth: Secondary | ICD-10-CM | POA: Diagnosis present

## 2017-08-17 DIAGNOSIS — D649 Anemia, unspecified: Secondary | ICD-10-CM | POA: Diagnosis present

## 2017-08-17 DIAGNOSIS — O9902 Anemia complicating childbirth: Secondary | ICD-10-CM | POA: Diagnosis present

## 2017-08-17 DIAGNOSIS — Z87891 Personal history of nicotine dependence: Secondary | ICD-10-CM | POA: Diagnosis not present

## 2017-08-17 DIAGNOSIS — J45909 Unspecified asthma, uncomplicated: Secondary | ICD-10-CM | POA: Diagnosis present

## 2017-08-17 DIAGNOSIS — O9952 Diseases of the respiratory system complicating childbirth: Secondary | ICD-10-CM | POA: Diagnosis present

## 2017-08-17 DIAGNOSIS — E669 Obesity, unspecified: Secondary | ICD-10-CM | POA: Diagnosis present

## 2017-08-17 DIAGNOSIS — O99824 Streptococcus B carrier state complicating childbirth: Secondary | ICD-10-CM | POA: Diagnosis present

## 2017-08-17 DIAGNOSIS — Z3483 Encounter for supervision of other normal pregnancy, third trimester: Secondary | ICD-10-CM | POA: Diagnosis present

## 2017-08-17 LAB — CBC
HCT: 34.1 % — ABNORMAL LOW (ref 35.0–47.0)
Hemoglobin: 11.4 g/dL — ABNORMAL LOW (ref 12.0–16.0)
MCH: 28.9 pg (ref 26.0–34.0)
MCHC: 33.5 g/dL (ref 32.0–36.0)
MCV: 86.3 fL (ref 80.0–100.0)
PLATELETS: 181 10*3/uL (ref 150–440)
RBC: 3.95 MIL/uL (ref 3.80–5.20)
RDW: 19.9 % — AB (ref 11.5–14.5)
WBC: 11.8 10*3/uL — AB (ref 3.6–11.0)

## 2017-08-17 LAB — TYPE AND SCREEN
ABO/RH(D): O POS
ANTIBODY SCREEN: NEGATIVE

## 2017-08-17 SURGERY — Surgical Case
Anesthesia: Spinal | Wound class: Clean Contaminated

## 2017-08-17 MED ORDER — ONDANSETRON HCL 4 MG/2ML IJ SOLN
INTRAMUSCULAR | Status: AC
  Filled 2017-08-17: qty 2

## 2017-08-17 MED ORDER — IBUPROFEN 600 MG PO TABS
600.0000 mg | ORAL_TABLET | Freq: Four times a day (QID) | ORAL | Status: DC
  Administered 2017-08-18 – 2017-08-19 (×5): 600 mg via ORAL
  Filled 2017-08-17 (×5): qty 1

## 2017-08-17 MED ORDER — ONDANSETRON HCL 4 MG/2ML IJ SOLN
INTRAMUSCULAR | Status: DC | PRN
  Administered 2017-08-17: 4 mg via INTRAVENOUS

## 2017-08-17 MED ORDER — OXYCODONE-ACETAMINOPHEN 5-325 MG PO TABS: 2.0000 | ORAL_TABLET | ORAL | Status: DC | PRN

## 2017-08-17 MED ORDER — SODIUM CHLORIDE 0.9 % IV SOLN
2.0000 g | Freq: Once | INTRAVENOUS | Status: AC
  Administered 2017-08-17: 2 g via INTRAVENOUS
  Filled 2017-08-17: qty 2000

## 2017-08-17 MED ORDER — TERBUTALINE SULFATE 1 MG/ML IJ SOLN
INTRAMUSCULAR | Status: AC
Start: 2017-08-17 — End: 2017-08-17
  Administered 2017-08-17: 1 mg
  Filled 2017-08-17: qty 1

## 2017-08-17 MED ORDER — PHENYLEPHRINE HCL 10 MG/ML IJ SOLN
INTRAMUSCULAR | Status: AC
  Filled 2017-08-17: qty 1

## 2017-08-17 MED ORDER — LABETALOL HCL 5 MG/ML IV SOLN
20.0000 mg | INTRAVENOUS | Status: DC | PRN
  Filled 2017-08-17: qty 16

## 2017-08-17 MED ORDER — OXYCODONE HCL 5 MG PO TABS
5.0000 mg | ORAL_TABLET | ORAL | Status: AC | PRN
  Administered 2017-08-17 – 2017-08-18 (×4): 5 mg via ORAL
  Filled 2017-08-17 (×4): qty 1

## 2017-08-17 MED ORDER — ONDANSETRON HCL 4 MG/2ML IJ SOLN: 4.0000 mg | Freq: Four times a day (QID) | INTRAMUSCULAR | Status: DC | PRN

## 2017-08-17 MED ORDER — ACETAMINOPHEN 325 MG PO TABS: 650.0000 mg | ORAL_TABLET | ORAL | Status: DC | PRN

## 2017-08-17 MED ORDER — BUPIVACAINE IN DEXTROSE 0.75-8.25 % IT SOLN
INTRATHECAL | Status: DC | PRN
  Administered 2017-08-17: 1.6 mL via INTRATHECAL

## 2017-08-17 MED ORDER — LACTATED RINGERS IV BOLUS
1000.0000 mL | Freq: Once | INTRAVENOUS | Status: AC
  Administered 2017-08-17: 1000 mL via INTRAVENOUS

## 2017-08-17 MED ORDER — DIPHENHYDRAMINE HCL 50 MG/ML IJ SOLN: 12.5000 mg | INTRAMUSCULAR | Status: DC | PRN

## 2017-08-17 MED ORDER — MORPHINE SULFATE (PF) 0.5 MG/ML IJ SOLN
INTRAMUSCULAR | Status: DC | PRN
  Administered 2017-08-17: .1 mg via INTRATHECAL

## 2017-08-17 MED ORDER — ACETAMINOPHEN 325 MG PO TABS
650.0000 mg | ORAL_TABLET | Freq: Four times a day (QID) | ORAL | Status: AC
  Administered 2017-08-17 – 2017-08-18 (×3): 650 mg via ORAL
  Filled 2017-08-17 (×3): qty 2

## 2017-08-17 MED ORDER — LIDOCAINE 5 % EX PTCH
MEDICATED_PATCH | CUTANEOUS | Status: AC
Start: 2017-08-17 — End: 2017-08-17
  Filled 2017-08-17: qty 1

## 2017-08-17 MED ORDER — LACTATED RINGERS IV SOLN
INTRAVENOUS | Status: DC
  Administered 2017-08-17: 09:00:00 via INTRAUTERINE

## 2017-08-17 MED ORDER — OXYCODONE-ACETAMINOPHEN 5-325 MG PO TABS
1.0000 | ORAL_TABLET | ORAL | Status: DC | PRN
  Administered 2017-08-18: 1 via ORAL
  Filled 2017-08-17: qty 1

## 2017-08-17 MED ORDER — HYDRALAZINE HCL 20 MG/ML IJ SOLN: 10.0000 mg | Freq: Once | INTRAMUSCULAR | Status: DC | PRN

## 2017-08-17 MED ORDER — EPHEDRINE SULFATE 50 MG/ML IJ SOLN
INTRAMUSCULAR | Status: DC | PRN
  Administered 2017-08-17 (×2): 10 mg via INTRAVENOUS

## 2017-08-17 MED ORDER — NALBUPHINE HCL 10 MG/ML IJ SOLN: 5.0000 mg | Freq: Once | INTRAMUSCULAR | Status: DC | PRN

## 2017-08-17 MED ORDER — KETOROLAC TROMETHAMINE 30 MG/ML IJ SOLN: 30.0000 mg | Freq: Four times a day (QID) | INTRAMUSCULAR | Status: AC

## 2017-08-17 MED ORDER — SODIUM CHLORIDE 0.9 % IV SOLN
1.0000 g | INTRAVENOUS | Status: DC
  Filled 2017-08-17 (×7): qty 1000

## 2017-08-17 MED ORDER — ZOLPIDEM TARTRATE 5 MG PO TABS: 5.0000 mg | ORAL_TABLET | Freq: Every evening | ORAL | Status: DC | PRN

## 2017-08-17 MED ORDER — PHENYLEPHRINE HCL 10 MG/ML IJ SOLN
INTRAMUSCULAR | Status: DC | PRN
  Administered 2017-08-17 (×3): 100 ug via INTRAVENOUS
  Administered 2017-08-17: 200 ug via INTRAVENOUS
  Administered 2017-08-17 (×4): 100 ug via INTRAVENOUS

## 2017-08-17 MED ORDER — PROMETHAZINE HCL 25 MG/ML IJ SOLN
12.5000 mg | Freq: Once | INTRAMUSCULAR | Status: AC
  Administered 2017-08-17: 12.5 mg via INTRAVENOUS

## 2017-08-17 MED ORDER — OXYTOCIN BOLUS FROM INFUSION: 500.0000 mL | Freq: Once | INTRAVENOUS | Status: DC

## 2017-08-17 MED ORDER — TERBUTALINE SULFATE 1 MG/ML IJ SOLN: 0.2500 mg | Freq: Once | INTRAMUSCULAR | Status: DC

## 2017-08-17 MED ORDER — LIDOCAINE HCL (PF) 1 % IJ SOLN
30.0000 mL | INTRAMUSCULAR | Status: DC | PRN
  Filled 2017-08-17: qty 30

## 2017-08-17 MED ORDER — PROPOFOL 10 MG/ML IV BOLUS
INTRAVENOUS | Status: AC
  Filled 2017-08-17: qty 20

## 2017-08-17 MED ORDER — SENNOSIDES-DOCUSATE SODIUM 8.6-50 MG PO TABS
2.0000 | ORAL_TABLET | ORAL | Status: DC
  Administered 2017-08-17 – 2017-08-19 (×2): 2 via ORAL
  Filled 2017-08-17 (×2): qty 2

## 2017-08-17 MED ORDER — OXYTOCIN 40 UNITS IN LACTATED RINGERS INFUSION - SIMPLE MED
2.5000 [IU]/h | INTRAVENOUS | Status: DC
  Administered 2017-08-17: 500 mL via INTRAVENOUS
  Filled 2017-08-17: qty 1000

## 2017-08-17 MED ORDER — MEPERIDINE HCL 25 MG/ML IJ SOLN: 6.2500 mg | INTRAMUSCULAR | Status: DC | PRN

## 2017-08-17 MED ORDER — SIMETHICONE 80 MG PO CHEW
80.0000 mg | CHEWABLE_TABLET | Freq: Four times a day (QID) | ORAL | Status: DC
  Administered 2017-08-17 – 2017-08-19 (×6): 80 mg via ORAL
  Filled 2017-08-17 (×6): qty 1

## 2017-08-17 MED ORDER — MENTHOL 3 MG MT LOZG: 1.0000 | LOZENGE | OROMUCOSAL | Status: DC | PRN

## 2017-08-17 MED ORDER — OXYTOCIN 10 UNIT/ML IJ SOLN
10.0000 [IU] | Freq: Once | INTRAMUSCULAR | Status: DC
  Filled 2017-08-17: qty 1

## 2017-08-17 MED ORDER — SOD CITRATE-CITRIC ACID 500-334 MG/5ML PO SOLN
30.0000 mL | ORAL | Status: DC | PRN
  Administered 2017-08-17: 30 mL via ORAL
  Filled 2017-08-17: qty 15

## 2017-08-17 MED ORDER — NALOXONE HCL 0.4 MG/ML IJ SOLN: 0.4000 mg | INTRAMUSCULAR | Status: DC | PRN

## 2017-08-17 MED ORDER — SODIUM CHLORIDE 0.9% FLUSH: 3.0000 mL | INTRAVENOUS | Status: DC | PRN

## 2017-08-17 MED ORDER — LACTATED RINGERS IV SOLN
500.0000 mL | INTRAVENOUS | Status: DC | PRN
  Administered 2017-08-17: 1000 mL via INTRAVENOUS

## 2017-08-17 MED ORDER — KETOROLAC TROMETHAMINE 30 MG/ML IJ SOLN
30.0000 mg | Freq: Four times a day (QID) | INTRAMUSCULAR | Status: AC
  Administered 2017-08-17 – 2017-08-18 (×4): 30 mg via INTRAVENOUS
  Filled 2017-08-17 (×4): qty 1

## 2017-08-17 MED ORDER — FENTANYL CITRATE (PF) 100 MCG/2ML IJ SOLN
INTRAMUSCULAR | Status: DC | PRN
  Administered 2017-08-17: 15 ug via INTRATHECAL

## 2017-08-17 MED ORDER — ONDANSETRON HCL 4 MG/2ML IJ SOLN
4.0000 mg | Freq: Three times a day (TID) | INTRAMUSCULAR | Status: DC | PRN
  Administered 2017-08-17: 4 mg via INTRAVENOUS
  Filled 2017-08-17: qty 2

## 2017-08-17 MED ORDER — MISOPROSTOL 200 MCG PO TABS
ORAL_TABLET | ORAL | Status: AC
  Filled 2017-08-17: qty 4

## 2017-08-17 MED ORDER — CEFAZOLIN SODIUM-DEXTROSE 2-4 GM/100ML-% IV SOLN
2.0000 g | INTRAVENOUS | Status: AC
  Administered 2017-08-17: 2 g via INTRAVENOUS
  Filled 2017-08-17: qty 100

## 2017-08-17 MED ORDER — MORPHINE SULFATE (PF) 0.5 MG/ML IJ SOLN
INTRAMUSCULAR | Status: AC
  Filled 2017-08-17: qty 10

## 2017-08-17 MED ORDER — COCONUT OIL OIL
1.0000 "application " | TOPICAL_OIL | Status: DC | PRN
  Administered 2017-08-17: 1 via TOPICAL
  Filled 2017-08-17: qty 120

## 2017-08-17 MED ORDER — NALBUPHINE HCL 10 MG/ML IJ SOLN: 5.0000 mg | INTRAMUSCULAR | Status: DC | PRN

## 2017-08-17 MED ORDER — DIPHENHYDRAMINE HCL 25 MG PO CAPS: 25.0000 mg | ORAL_CAPSULE | Freq: Four times a day (QID) | ORAL | Status: DC | PRN

## 2017-08-17 MED ORDER — LIDOCAINE 5 % EX PTCH
MEDICATED_PATCH | CUTANEOUS | Status: DC | PRN
  Administered 2017-08-17: 1 via TRANSDERMAL

## 2017-08-17 MED ORDER — OXYTOCIN 40 UNITS IN LACTATED RINGERS INFUSION - SIMPLE MED
2.5000 [IU]/h | INTRAVENOUS | Status: AC
  Administered 2017-08-17: 2.5 [IU]/h via INTRAVENOUS

## 2017-08-17 MED ORDER — OXYCODONE HCL 5 MG PO TABS: 5.0000 mg | ORAL_TABLET | Freq: Once | ORAL | Status: DC | PRN

## 2017-08-17 MED ORDER — PRENATAL MULTIVITAMIN CH
1.0000 | ORAL_TABLET | Freq: Every day | ORAL | Status: DC
  Administered 2017-08-18: 1 via ORAL
  Filled 2017-08-17 (×2): qty 1

## 2017-08-17 MED ORDER — FENTANYL CITRATE (PF) 100 MCG/2ML IJ SOLN
INTRAMUSCULAR | Status: AC
  Filled 2017-08-17: qty 2

## 2017-08-17 MED ORDER — PROMETHAZINE HCL 25 MG/ML IJ SOLN
INTRAMUSCULAR | Status: AC
  Filled 2017-08-17: qty 1

## 2017-08-17 MED ORDER — BUTORPHANOL TARTRATE 2 MG/ML IJ SOLN
1.0000 mg | INTRAMUSCULAR | Status: DC | PRN
  Administered 2017-08-17 (×3): 1 mg via INTRAVENOUS
  Filled 2017-08-17 (×3): qty 1

## 2017-08-17 MED ORDER — SODIUM CHLORIDE 0.9 % IJ SOLN
INTRAMUSCULAR | Status: AC
Start: 2017-08-17 — End: 2017-08-18
  Filled 2017-08-17: qty 50

## 2017-08-17 MED ORDER — AMMONIA AROMATIC IN INHA
RESPIRATORY_TRACT | Status: AC
  Filled 2017-08-17: qty 10

## 2017-08-17 MED ORDER — LACTATED RINGERS IV SOLN
INTRAVENOUS | Status: DC
  Administered 2017-08-18: 02:00:00 via INTRAVENOUS

## 2017-08-17 MED ORDER — OXYCODONE HCL 5 MG/5ML PO SOLN: 5.0000 mg | Freq: Once | ORAL | Status: DC | PRN

## 2017-08-17 MED ORDER — FENTANYL CITRATE (PF) 100 MCG/2ML IJ SOLN: 25.0000 ug | INTRAMUSCULAR | Status: DC | PRN

## 2017-08-17 MED ORDER — DIPHENHYDRAMINE HCL 25 MG PO CAPS: 25.0000 mg | ORAL_CAPSULE | ORAL | Status: DC | PRN

## 2017-08-17 SURGICAL SUPPLY — 29 items
ADH LQ OCL WTPRF AMP STRL LF (MISCELLANEOUS) ×1
ADHESIVE MASTISOL STRL (MISCELLANEOUS) ×3 IMPLANT
BAG COUNTER SPONGE EZ (MISCELLANEOUS) ×2 IMPLANT
BAG SPNG 4X4 CLR HAZ (MISCELLANEOUS) ×1
CANISTER SUCT 3000ML PPV (MISCELLANEOUS) ×3 IMPLANT
CELL SAVER LIPIGURD (MISCELLANEOUS) ×1 IMPLANT
CHLORAPREP W/TINT 26ML (MISCELLANEOUS) ×6 IMPLANT
COUNTER SPONGE BAG EZ (MISCELLANEOUS) ×1
DEVICE RETRIEVAL ALEXIS 14 (MISCELLANEOUS) ×1 IMPLANT
DRSG TELFA 3X8 NADH (GAUZE/BANDAGES/DRESSINGS) ×3 IMPLANT
EXTRT SYSTEM ALEXIS 14CM (MISCELLANEOUS) ×3
GAUZE SPONGE 4X4 12PLY STRL (GAUZE/BANDAGES/DRESSINGS) ×3 IMPLANT
GLOVE BIOGEL PI ORTHO PRO 7.5 (GLOVE) ×2
GLOVE PI ORTHO PRO STRL 7.5 (GLOVE) ×1 IMPLANT
GOWN STRL REUS W/ TWL LRG LVL3 (GOWN DISPOSABLE) ×2 IMPLANT
GOWN STRL REUS W/TWL LRG LVL3 (GOWN DISPOSABLE) ×6
KIT TURNOVER KIT A (KITS) ×3 IMPLANT
NS IRRIG 1000ML POUR BTL (IV SOLUTION) ×3 IMPLANT
PACK C SECTION AR (MISCELLANEOUS) ×3 IMPLANT
PAD DRESSING TELFA 3X8 NADH (GAUZE/BANDAGES/DRESSINGS) ×1 IMPLANT
PAD OB MATERNITY 4.3X12.25 (PERSONAL CARE ITEMS) ×3 IMPLANT
PAD PREP 24X41 OB/GYN DISP (PERSONAL CARE ITEMS) ×3 IMPLANT
RETRACTOR TRAXI PANNICULUS (MISCELLANEOUS) IMPLANT
SPONGE LAP 18X18 5 PK (GAUZE/BANDAGES/DRESSINGS) ×3 IMPLANT
SUT VIC AB 0 CTX 36 (SUTURE) ×6
SUT VIC AB 0 CTX36XBRD ANBCTRL (SUTURE) ×2 IMPLANT
SUT VIC AB 1 CT1 36 (SUTURE) ×6 IMPLANT
SUT VICRYL+ 3-0 36IN CT-1 (SUTURE) ×6 IMPLANT
TRAXI PANNICULUS RETRACTOR (MISCELLANEOUS) ×2

## 2017-08-17 NOTE — Anesthesia Procedure Notes (Signed)
Spinal  Patient location during procedure: OR Staffing Anesthesiologist: Piscitello, Precious Haws, MD Resident/CRNA: Rolla Plate, CRNA Performed: resident/CRNA  Preanesthetic Checklist Completed: patient identified, site marked, surgical consent, pre-op evaluation, timeout performed, IV checked, risks and benefits discussed and monitors and equipment checked Spinal Block Patient position: sitting Prep: ChloraPrep and site prepped and draped Patient monitoring: heart rate, continuous pulse ox, blood pressure and cardiac monitor Approach: midline Location: L4-5 Injection technique: single-shot Needle Needle type: Introducer and Pencan  Needle gauge: 24 G Needle length: 9 cm Assessment Sensory level: T3 Additional Notes Negative paresthesia. Negative blood return. Positive free-flowing CSF. Expiration date of kit checked and confirmed. Patient tolerated procedure well, without complications.

## 2017-08-17 NOTE — Progress Notes (Signed)
LABOR NOTE   Natalie Beck 27 y.o.GP@ at [redacted]w[redacted]d Active phase labor.  SUBJECTIVE:  Very uncomfortable with contractions. Has had two doses of stadol. Declines additional medications OBJECTIVE:  BP 126/60 (BP Location: Left Arm)   Pulse 73   Temp 97.9 F (36.6 C) (Axillary)   Resp 18   Ht  (1.549 m)   Wt 240 lb (108.9 kg)   LMP 11/07/2016 (Exact Date)   BMI 45.35 kg/m  No intake/output data recorded.  She has shown cervical change. CERVIX: 8CM:  80:   -3:   mid position:   soft SVE:   Dilation: 8 Effacement (%): 80 Station: -3 Exam by:: A. Tkeyah Burkman CONTRACTIONS: regular, every 1-4 minutes FHR: Fetal heart tracing reviewed. Baseline: 120 bpm, Variability: Good {> 6 bpm), Accelerations: Reactive and Decelerations: early, varaibles, occasional late Category II   Analgesia: IV pain meds  Labs: Lab Results  Component Value Date   WBC 11.8 (H) 08/17/2017   HGB 11.4 (L) 08/17/2017   HCT 34.1 (L) 08/17/2017   MCV 86.3 08/17/2017   PLT 181 08/17/2017    ASSESSMENT: 1) Labor curve reviewed.       Progress: Active phase labor.     Membranes: ruptured, clear fluid, Amnioinfusion started      Active Problems:   Labor and delivery, indication for care Obesity PLAN: continue present management. Discussed labor progress with pt and her family. Pt and her family informed of high fetal station and concern that it baby is not descending in pelvis but given her continued progress we could continue to labor as long as baby tolerated labor. She verbalizes and agrees to plan of care. Dr. Clayburn Pert updated on pt progress and plan of care.   [name], CNM  08/17/2017 7:55 AM

## 2017-08-17 NOTE — Progress Notes (Signed)
LABOR NOTE   Natalie Beck 27 y.o.GP@ at [redacted]w[redacted]d Active phase labor.  SUBJECTIVE:  Uncomfortable with contractions, declines epidural OBJECTIVE:  BP 126/60 (BP Location: Left Arm)   Pulse 73   Temp 98.4 F (36.9 C) (Oral)   Resp 20   Ht  (1.549 m)   Wt 240 lb (108.9 kg)   LMP 11/07/2016 (Exact Date)   BMI 45.35 kg/m  No intake/output data recorded.  She has shown cervical change. CERVIX: 9cm:  80:   -3:   mid position:   soft SVE:   Dilation: 10 Effacement (%): 80 Station: -3 Exam by:: Natalie Beck CONTRACTIONS: regular, every 1-3  minutes FHR: Fetal heart tracing reviewed. Baseline: 120 bpm, Variability: Good {> 6 bpm), Accelerations: Non-reactive but appropriate for gestational age and Decelerations: early, late , and varable Category II, variable decelerations down to 60's with slow return to baseline.    Analgesia: Labor support without medications  Labs: Lab Results  Component Value Date   WBC 11.8 (H) 08/17/2017   HGB 11.4 (L) 08/17/2017   HCT 34.1 (L) 08/17/2017   MCV 86.3 08/17/2017   PLT 181 08/17/2017    ASSESSMENT: 1) Labor curve reviewed.       Progress: Active phase labor. and suspect fetal intolerance of labor     Membranes: ruptured, clear fluid, amnioinfusion       Active Problems:   Labor and delivery, indication for care obesity  PLAN: Dose of terbutaline given.IV fluid bolus, maternal position changes, oxygen via face mask. Dr. Logan Bores consulted for cesarean section.   [name], CNM  08/17/2017 8:57 AM

## 2017-08-17 NOTE — Anesthesia Preprocedure Evaluation (Signed)
Anesthesia Evaluation  Patient identified by MRN, date of birth, ID band Patient awake    Reviewed: Allergy & Precautions, H&P , NPO status , Patient's Chart, lab work & pertinent test results  History of Anesthesia Complications Negative for: history of anesthetic complications  Airway Mallampati: III  TM Distance: <3 FB Neck ROM: full    Dental  (+) Chipped, Poor Dentition, Missing, Partial Upper   Pulmonary neg shortness of breath, asthma , former smoker,           Cardiovascular Exercise Tolerance: Good (-) hypertensionnegative cardio ROS       Neuro/Psych    GI/Hepatic negative GI ROS,   Endo/Other    Renal/GU   negative genitourinary   Musculoskeletal   Abdominal   Peds  Hematology   Anesthesia Other Findings Past Medical History: No date: Anemia No date: Asthma  Past Surgical History: No date: dental implant No date: WISDOM TOOTH EXTRACTION  BMI    Body Mass Index:  45.35 kg/m      Reproductive/Obstetrics (+) Pregnancy                             Anesthesia Physical Anesthesia Plan  ASA: III and emergent  Anesthesia Plan: Spinal   Post-op Pain Management:    Induction:   PONV Risk Score and Plan:   Airway Management Planned: Natural Airway and Nasal Cannula  Additional Equipment:   Intra-op Plan:   Post-operative Plan:   Informed Consent: I have reviewed the patients History and Physical, chart, labs and discussed the procedure including the risks, benefits and alternatives for the proposed anesthesia with the patient or authorized representative who has indicated his/her understanding and acceptance.   Dental Advisory Given  Plan Discussed with: Anesthesiologist, CRNA and Surgeon  Anesthesia Plan Comments: (OB MD reports that he feels that we have time to do a quick spinal over GA  Patient reports no bleeding problems and no anticoagulant  use.  Plan for spinal with backup GA  Patient consented for risks of anesthesia including but not limited to:  - adverse reactions to medications - risk of bleeding, infection, nerve damage and headache - risk of failed spinal - damage to teeth, lips or other oral mucosa - sore throat or hoarseness - Damage to heart, brain, lungs or loss of life  Patient voiced understanding.)        Anesthesia Quick Evaluation

## 2017-08-17 NOTE — H&P (Signed)
History and Physical   HPI  Martisha A Mcneish is a 27 y.o. G1P0 at [redacted]w[redacted]d Estimated Date of Delivery: 08/14/17 who is being admitted for labor management.   OB History  OB History  Gravida Para Term Preterm AB Living  1 0 0 0 0 0  SAB TAB Ectopic Multiple Live Births  0 0 0 0 0    # Outcome Date GA Lbr Len/2nd Weight Sex Delivery Anes PTL Lv  1 Current             PROBLEM LIST  Pregnancy complications or risks: Patient Active Problem List   Diagnosis Date Noted  . Labor and delivery, indication for care 08/16/2017  . Pregnancy 07/06/2017  . Indication for care in labor or delivery 07/06/2017  . Anemia affecting pregnancy 05/24/2017  . Group beta Strep positive 01/20/2017  . OBESITY, NOS 06/19/2006  . RHINITIS, ALLERGIC 06/19/2006  . ASTHMA, EXERCISE INDUCED 06/19/2006    Prenatal labs and studies: ABO, Rh: O/Positive/-- (09/24 1629) Antibody: Negative (09/24 1629) Rubella: 4.27 (09/24 1629) RPR: Non Reactive (02/01 1233)  HBsAg: Negative (09/24 1629)  HIV: Non Reactive (09/24 1629)  GBS: positive in urine   Past Medical History:  Diagnosis Date  . Anemia   . Asthma      Past Surgical History:  Procedure Laterality Date  . dental implant    . WISDOM TOOTH EXTRACTION       Medications    Current Discharge Medication List    CONTINUE these medications which have NOT CHANGED   Details  albuterol (PROVENTIL HFA;VENTOLIN HFA) 108 (90 Base) MCG/ACT inhaler Inhale into the lungs every 6 (six) hours as needed for wheezing or shortness of breath.    ferrous sulfate 325 (65 FE) MG EC tablet Take 325 mg 3 (three) times daily with meals by mouth.    Prenatal Vit-Fe Fumarate-FA (PRENATAL VITAMINS PLUS PO) Take by mouth.         Allergies  Citrus  Review of Systems  Constitutional: negative Eyes: negative Ears, nose, mouth, throat, and face: negative Respiratory: negative Cardiovascular: negative Gastrointestinal:  negative Genitourinary:negative Integument/breast: negative Hematologic/lymphatic: negative Musculoskeletal:negative Neurological: negative Behavioral/Psych: negative Endocrine: negative Allergic/Immunologic: negative  Physical Exam  Temp 98.5 F (36.9 C) (Oral)   Resp 18   Ht  (1.549 m)   Wt 240 lb (108.9 kg)   LMP 11/07/2016 (Exact Date)   BMI 45.35 kg/m   Lungs:  Clear bilaterally Cardio: RRR  Abd: Soft, gravid, NT Presentation: cephalic on u/s on 4/24EXT: No C/C/ 1+ Edema DTRs: 2+ B CERVIX: 6cm  :  80:   -3:    mid position:    Soft, narrow pelvis   See Prenatal records for more detailed PE.     FHR: Difficult to assess due to maternal body habitus and increased maternal movement with contractions. Internal monitors placed  Baseline: 140 bpm, Variability: Good {> 6 bpm), Accelerations: Non-reactive but appropriate for gestational age and Decelerations: Early.   Toco: Uterine Contractions: Frequency: Every 2-6 minutes, Duration: 60-140 seconds and Intensity: moderate   Test Results  No results found for this or any previous visit (from the past 24 hour(s)). Group B Strep positive  Assessment   G1P0 at [redacted]w[redacted]d Estimated Date of Delivery: 08/14/17 . Category 2 strip  Patient Active Problem List   Diagnosis Date Noted  . Labor and delivery, indication for care 08/16/2017  . Pregnancy 07/06/2017  . Indication for care in labor or delivery 07/06/2017  .  Anemia affecting pregnancy 05/24/2017  . Group beta Strep positive 01/20/2017  . OBESITY, NOS 06/19/2006  . RHINITIS, ALLERGIC 06/19/2006  . ASTHMA, EXERCISE INDUCED 06/19/2006    Plan  1. Admit to L&D :   maternal oxygen administration, IV fluid bolus, AROM for clear fluid. Placed IUPC and  FSE 2. EFM:-- Category 2 3. Epidural if desired. Stadol for IV pain until epidural requested. 4. Admission labs  5. Dr. Logan Bores consulted on pt plan of care  [name], [facility]  08/17/2017 5:11 AM

## 2017-08-17 NOTE — Op Note (Signed)
      OP NOTE  Date: 08/17/2017   10:47 AM Name Natalie Beck MR# [id number]  Preoperative Diagnosis: 1. Intrauterine pregnancy at [redacted]w[redacted]d Active Problems:   Labor and delivery, indication for care  2.  cephalo-pelvic disproportion, failure to progress: arrest of dilation and fetal intolerance  Postoperative Diagnosis: 1. Intrauterine pregnancy at [redacted]w[redacted]d, delivered 2. Viable infant 3. Remainder same as pre-op   Procedure: 1. Primary Low-Transverse Cesarean Section  Surgeon: Elonda Husky, MD  Assistant:  Thompson  Anesthesia: Spinal    EBL: 700  ml     Findings: 1) female infant, Apgar scores of 9    at 1 minute and 9    at 5 minutes and a birthweight of 101.94  ounces.    2) Normal uterus, tubes and ovaries.    Procedure:  The patient was prepped and draped in the supine position and placed under spinal anesthesia.  A traxi retractor was placed.  A transverse incision was made across the abdomen in a Pfannenstiel manner. If indicated the old scar was systematically removed with sharp dissection.  We carried the dissection down to the level of the fascia.  The fascia was incised in a curvilinear manner.  The fascia was then elevated from the rectus muscles with blunt and sharp dissection.  The rectus muscles were separated laterally exposing the peritoneum.  The peritoneum was carefully entered with care being taken to avoid bowel and bladder.  A self-retaining retractor was placed.  The visceral peritoneum was incised in a curvilinear fashion across the lower uterine segment creating a bladder flap. A transverse incision was made across the lower uterine segment and extended laterally and superiorly using the bandage scissors.  Artificial rupture membranes was performed and Clear fluid was noted.  The infant was delivered from the cephalic position.  A nuchal cord was not present. The cord was doubly clamped and cut. Cord blood was obtained if appropriate.  The infant was  handed to the pediatric personnel  who then placed the infant under heat lamps where it was cleaned dried and re-suctioned. The placenta was delivered. The hysterotomy incision was then identified on ring forceps.  The uterine cavity was cleaned with a moist lap sponge.  The hysterotomy incision was closed with a running interlocking suture of Vicryl.  Hemostasis was excellent.  Pitocin was run in the IV and the uterus was found to be firm. The posterior cul-de-sac and gutters were cleaned and inspected.  Hemostasis was noted.  The fascia was then closed with a running suture of #1 Vicryl.  Hemostasis of the subcutaneous tissues was obtained using the Bovie.  The subcutaneous tissues were closed with a running suture of 000 Vicryl.  A subcuticular suture was placed.  Steri-Strips were applied in the usual manner.  A pressure dressing was placed.  The patient went to the recovery room in stable condition.   Elonda Husky, M.D. 08/17/2017 10:47 AM

## 2017-08-17 NOTE — Transfer of Care (Signed)
Immediate Anesthesia Transfer of Care Note  Patient: Natalie Beck  Procedure(s) Performed: CESAREAN SECTION (N/A )  Patient Location: Mother/Baby  Anesthesia Type:Spinal  Level of Consciousness: awake, alert  and oriented  Airway & Oxygen Therapy: Patient Spontanous Breathing  Post-op Assessment: Report given to RN and Post -op Vital signs reviewed and stable  Post vital signs: Reviewed  Last Vitals:  Vitals Value Taken Time  BP 110/49 08/17/2017 10:58 AM  Temp 36.4 C 08/17/2017 10:58 AM  Pulse 89 08/17/2017 10:58 AM  Resp 18 08/17/2017 10:58 AM  SpO2 99 % 08/17/2017 10:58 AM  Vitals shown include unvalidated device data.  Last Pain:  Vitals:   08/17/17 0807  TempSrc: Oral  PainSc:          Complications: No apparent anesthesia complications

## 2017-08-17 NOTE — Anesthesia Post-op Follow-up Note (Signed)
Anesthesia QCDR form completed.        

## 2017-08-18 ENCOUNTER — Other Ambulatory Visit: Payer: Medicaid Other

## 2017-08-18 ENCOUNTER — Encounter: Payer: Self-pay | Admitting: Obstetrics and Gynecology

## 2017-08-18 ENCOUNTER — Encounter: Payer: Medicaid Other | Admitting: Certified Nurse Midwife

## 2017-08-18 MED ORDER — OXYCODONE HCL 5 MG PO TABS: 10.0000 mg | ORAL_TABLET | ORAL | Status: DC | PRN

## 2017-08-18 MED ORDER — OXYCODONE HCL 5 MG PO TABS: 5.0000 mg | ORAL_TABLET | ORAL | Status: DC | PRN

## 2017-08-18 MED ORDER — ONDANSETRON HCL 4 MG PO TABS
4.0000 mg | ORAL_TABLET | Freq: Four times a day (QID) | ORAL | Status: DC | PRN
  Administered 2017-08-18: 4 mg via ORAL
  Filled 2017-08-18: qty 1

## 2017-08-18 MED ORDER — OXYCODONE HCL 5 MG PO TABS
10.0000 mg | ORAL_TABLET | ORAL | Status: DC | PRN
  Administered 2017-08-18 – 2017-08-19 (×4): 10 mg via ORAL
  Filled 2017-08-18 (×4): qty 2

## 2017-08-18 NOTE — Progress Notes (Signed)
At 0500 RN and nurse tech in room to get orthostatic ("posteral") blood pressures; BP taken while lying down, sitting up and standing at bedside; no significant changes in BP during these activities; foley catheter was able to be removed and pt stood at bedside a second time; pt had NO symptoms of low BP during these activities

## 2017-08-18 NOTE — Anesthesia Postprocedure Evaluation (Signed)
Anesthesia Post Note  Patient: Natalie Beck  Procedure(s) Performed: CESAREAN SECTION (N/A )  Patient location during evaluation: PACU Anesthesia Type: Spinal Level of consciousness: oriented and awake and alert Pain management: pain level controlled Vital Signs Assessment: post-procedure vital signs reviewed and stable Respiratory status: spontaneous breathing, respiratory function stable and patient connected to nasal cannula oxygen Cardiovascular status: blood pressure returned to baseline and stable Postop Assessment: no headache, no backache and no apparent nausea or vomiting Anesthetic complications: no     Last Vitals:  Vitals:   08/18/17 0507 08/18/17 0512  BP: (!) 105/55 (!) 100/45  Pulse:    Resp:    Temp:    SpO2:      Last Pain:  Vitals:   08/18/17 0651  TempSrc:   PainSc: 6                  Starling Manns

## 2017-08-18 NOTE — Progress Notes (Signed)
Patient ID: Natalie Beck, female   DOB: Jul 13, 1990, 27 y.o.   MRN: [id number]    Progress Note - Cesarean Delivery  Natalie Beck is a 27 y.o. G1P1001 now PP day 1 s/p C-Section, Low Transverse .   Subjective:  Patient reports no problems with eating, bowel movements, voiding, or their wound  Objective:  Vital signs in last 24 hours: Temp:  [97.9 F (36.6 C)-99.3 F (37.4 C)] 99.3 F (37.4 C) (04/29 0812) Pulse Rate:  [65-121] 75 (04/29 0812) Resp:  [0-30] 20 (04/29 0812) BP: (99-138)/(45-88) 101/51 (04/29 0812) SpO2:  [98 %-100 %] 99 % (04/29 4098)  Physical Exam:  General: alert and cooperative Lochia: appropriate Uterine Fundus: firm Incision: Dressing intact DVT Evaluation: No evidence of DVT seen on physical exam.    Data Review Recent Labs    08/17/17 0455  HGB 11.4*  HCT 34.1*    Assessment:  Active Problems:   Labor and delivery, indication for care   Status post Cesarean section. Doing well postoperatively.     Plan:       Continue current care.    Elonda Husky, M.D. 08/18/2017 11:31 AM

## 2017-08-18 NOTE — Anesthesia Post-op Follow-up Note (Signed)
  Anesthesia Pain Follow-up Note  Patient: Natalie Beck  Day #: 1  Date of Follow-up: 08/18/2017 Time: 8:06 AM  Last Vitals:  Vitals:   08/18/17 0507 08/18/17 0512  BP: (!) 105/55 (!) 100/45  Pulse:    Resp:    Temp:    SpO2:      Level of Consciousness: alert  Pain: none   Side Effects:None  Catheter Site Exam:clean, dry, no drainage     Plan: D/C from anesthesia care at surgeon's request  Starling Manns

## 2017-08-18 NOTE — Progress Notes (Signed)
Progress Note - Cesarean Delivery  Natalie Beck is a 27 y.o. G1P1001 now PP day 1 s/p C-Section, Low Transverse .   Subjective:  Patient reports no problems with eating, bowel movements, voiding, or their wound  Objective:  Vital signs in last 24 hours: Temp:  [97.5 F (36.4 C)-99.3 F (37.4 C)] 99.3 F (37.4 C) (04/29 0812) Pulse Rate:  [65-121] 75 (04/29 0812) Resp:  [0-30] 20 (04/29 0812) BP: (99-168)/(45-134) 101/51 (04/29 0812) SpO2:  [98 %-100 %] 99 % (04/29 1610)  Physical Exam:  General: alert, cooperative, appears stated age and no distress Lochia: appropriate Uterine Fundus: firm Incision: healing well, dressing dry and intact , for removal today DVT Evaluation: No evidence of DVT seen on physical exam. No cords or calf tenderness. No significant calf/ankle edema.    Data Review Recent Labs    08/17/17 0455  HGB 11.4*  HCT 34.1*    Assessment:  Active Problems:   Labor and delivery, indication for care   Status post Cesarean section. Doing well postoperatively.     Plan:       Continue current care.    [name], CNM  08/18/2017 9:20 AM

## 2017-08-19 LAB — RPR: RPR Ser Ql: NONREACTIVE

## 2017-08-19 MED ORDER — IBUPROFEN 600 MG PO TABS: 600.0000 mg | ORAL_TABLET | Freq: Four times a day (QID) | ORAL | 0 refills | Status: DC

## 2017-08-19 MED ORDER — DOCUSATE SODIUM 100 MG PO CAPS: 100.0000 mg | ORAL_CAPSULE | Freq: Every day | ORAL | 2 refills | Status: AC | PRN

## 2017-08-19 MED ORDER — OXYCODONE HCL 10 MG PO TABS: 10.0000 mg | ORAL_TABLET | Freq: Four times a day (QID) | ORAL | 0 refills | Status: DC | PRN

## 2017-08-19 NOTE — Plan of Care (Signed)
Vs stable; up ad lib independently; pt showered evening on day shift on Monday 08-18-17; Monday night shift RN removed dressing and applied telfa; incision is WNL; pt taking PO roxicodone and motrin for pain control; breastfeeding and doing well; LC did round on pt and assist with a feeding on 08-18-17

## 2017-08-19 NOTE — Discharge Summary (Signed)
Physician Obstetric Discharge Summary  Patient ID: Natalie Beck MRN: [id number] DOB/AGE: [birth year] 27 y.o.   Date of Admission: 08/17/2017  Date of Discharge: 08/19/2017  Admitting Diagnosis: Labor at [redacted]w[redacted]d  Mode of Delivery: primary cesarean section       low uterine, transverse     Discharge Diagnosis: Reasons for cesarean section  Arrest of Dilation and Non-reassuring FHR   Intrapartum Procedures: Atificial rupture of membranes, GBS prophylaxis, placement of fetal scalp electrode and placement of intrauterine catheter   Post partum procedures: none  Complications: none                        Discharge Day SOAP Note:  Subjective:  The patient has no complaints.  She is ambulating well. She is taking PO well. Pain is well controlled with current medications. Patient is urinating without difficulty.   She is passing flatus.    Objective  Vital signs in last 24 hours: BP (!) 107/52 (BP Location: Left Arm)   Pulse 82   Temp 98.2 F (36.8 C) (Oral)   Resp 18   Ht  (1.549 m)   Wt 240 lb (108.9 kg)   LMP 11/07/2016 (Exact Date)   SpO2 100%   Breastfeeding? Unknown   BMI 45.35 kg/m   Physical Exam: Gen: NAD Abdomen:  clean, dry, no drainage Fundus Fundal Tone: Firm  Lochia Amount: Small     Data Review Labs: CBC Latest Ref Rng & Units 08/17/2017 07/16/2017 05/23/2017  WBC 3.6 - 11.0 K/uL 11.8(H) 11.5(H) 12.1(H)  Hemoglobin 12.0 - 16.0 g/dL 11.4(L) 10.9(L) 9.4(L)  Hematocrit 35.0 - 47.0 % 34.1(L) 35.5 29.6(L)  Platelets 150 - 440 K/uL 181 202 192   O POS  Assessment:  Active Problems:   Labor and delivery, indication for care   Doing well.  Normal progress as expected.    Plan:  Discharge to home  Modified rest as directed - may slowly resume normal activities with restrictions  as discussed.  Medications as written.  See below for additional.       Discharge Instructions: Per After Visit Summary. Activity: Advance as tolerated.  Pelvic rest for 6 weeks.  Also refer to After Visit Summary.  Wound care discussed. Diet: Regular Medications: Allergies as of 08/19/2017      Reactions   Citrus Dermatitis   Hypoallergenic dermatitis-tongue swells and itches      Medication List    TAKE these medications   albuterol 108 (90 Base) MCG/ACT inhaler Commonly known as:  PROVENTIL HFA;VENTOLIN HFA Inhale into the lungs every 6 (six) hours as needed for wheezing or shortness of breath.   ferrous sulfate 325 (65 FE) MG EC tablet Take 325 mg 3 (three) times daily with meals by mouth.   ibuprofen 600 MG tablet Commonly known as:  ADVIL,MOTRIN Take 1 tablet (600 mg total) by mouth every 6 (six) hours.   Oxycodone HCl 10 MG Tabs Take 1 tablet (10 mg total) by mouth every 6 (six) hours as needed for severe pain. 1-2 tables every   PRENATAL VITAMINS PLUS PO Take by mouth.      Outpatient follow up: 1 week for wound check with Pattricia Boss Postpartum contraception: Condoms   Discharged Condition: good  Discharged to: home  Newborn Data: Disposition:home with mother  Apgars: APGAR (1 MIN): 9   APGAR (5 MINS): 9   APGAR (10 MINS):    Baby Feeding: Breast  [name], CNM 08/19/2017 8:03 AM

## 2017-08-19 NOTE — Final Progress Note (Signed)
Discharge Day SOAP Note:  Subjective:  The patient has no complaints.  She is ambulating well. She is taking PO well. Pain is well controlled with current medications. Patient is urinating without difficulty.   She is passing flatus.    Objective  Vital signs in last 24 hours: BP (!) 107/52 (BP Location: Left Arm)   Pulse 82   Temp 98.2 F (36.8 C) (Oral)   Resp 18   Ht  (1.549 m)   Wt 240 lb (108.9 kg)   LMP 11/07/2016 (Exact Date)   SpO2 100%   Breastfeeding? Unknown   BMI 45.35 kg/m   Physical Exam: Gen: NAD Abdomen:  clean, dry, no drainage Fundus Fundal Tone: Firm  Lochia Amount: Small     Data Review Labs: CBC Latest Ref Rng & Units 08/17/2017 07/16/2017 05/23/2017  WBC 3.6 - 11.0 K/uL 11.8(H) 11.5(H) 12.1(H)  Hemoglobin 12.0 - 16.0 g/dL 11.4(L) 10.9(L) 9.4(L)  Hematocrit 35.0 - 47.0 % 34.1(L) 35.5 29.6(L)  Platelets 150 - 440 K/uL 181 202 192   O POS  Assessment:  Active Problems:   Labor and delivery, indication for care   Doing well.  Normal progress as expected.    Plan:  Discharge to home  Modified rest as directed - may slowly resume normal activities with restrictions  as discussed.  Medications as written.  See below for additional.       Discharge Instructions: Per After Visit Summary. Activity: Advance as tolerated. Pelvic rest for 6 weeks.  Also refer to After Visit Summary.  Wound care discussed. Diet: Regular Medications: Allergies as of 08/19/2017      Reactions   Citrus Dermatitis   Hypoallergenic dermatitis-tongue swells and itches      Medication List    TAKE these medications   albuterol 108 (90 Base) MCG/ACT inhaler Commonly known as:  PROVENTIL HFA;VENTOLIN HFA Inhale into the lungs every 6 (six) hours as needed for wheezing or shortness of breath.   ferrous sulfate 325 (65 FE) MG EC tablet Take 325 mg 3 (three) times daily with meals by mouth.   ibuprofen 600 MG tablet Commonly known as:   ADVIL,MOTRIN Take 1 tablet (600 mg total) by mouth every 6 (six) hours.   Oxycodone HCl 10 MG Tabs Take 1 tablet (10 mg total) by mouth every 6 (six) hours as needed for severe pain. 1-2 tables every   PRENATAL VITAMINS PLUS PO Take by mouth.      Outpatient follow up: 1 week for wound check with Pattricia Boss Postpartum contraception: Condoms   Discharged Condition: good  Discharged to: home  Newborn Data: Disposition:home with mother  Apgars: APGAR (1 MIN): 9   APGAR (5 MINS): 9   APGAR (10 MINS):    Baby Feeding: Breast  [name], CNM 08/19/2017 8:03 AM

## 2017-08-19 NOTE — Progress Notes (Signed)
Patient discharged home with infant. Discharge instructions and prescriptions given and reviewed with patient. Patient verbalized understanding. Escorted out by auxillary.  

## 2017-08-21 ENCOUNTER — Telehealth: Payer: Self-pay | Admitting: Certified Nurse Midwife

## 2017-08-21 NOTE — Telephone Encounter (Signed)
The patient called and stated that she would like to speak with Marcelino Duster in regards to her having inflammation in her legs. Please advise.

## 2017-08-21 NOTE — Telephone Encounter (Signed)
Telephone call to patient, verified full name and date of birth.   Reports bilateral lower extremity swelling from knees to feet since Tuesday. Today, swelling is greater on right side than the left side.   Denies redness to lower leg, no warmth, or pain with flexion of foot. No headaches or blurred vision.   Advised patient to come to clinic for evaluation tomorrow or go to ED tonight if anything changes.   Reviewed red flag symptoms and when to call.    Gunnar Bulla, CNM [facility], [facility]

## 2017-08-22 ENCOUNTER — Ambulatory Visit (INDEPENDENT_AMBULATORY_CARE_PROVIDER_SITE_OTHER): Payer: Medicaid Other | Admitting: Certified Nurse Midwife

## 2017-08-22 ENCOUNTER — Ambulatory Visit: Payer: Self-pay

## 2017-08-22 VITALS — BP 94/59 | HR 76 | Wt 242.0 lb

## 2017-08-22 DIAGNOSIS — Z4889 Encounter for other specified surgical aftercare: Secondary | ICD-10-CM

## 2017-08-22 NOTE — Patient Instructions (Signed)

## 2017-08-22 NOTE — Lactation Note (Signed)
This note was copied from a baby's chart. Lactation Consultation Note  Patient Name: Natalie Beck GMWNU'U Date: 08/22/2017     Maternal Data  Mom is concerned that baby's feedings aren't going well due to 9% wt loss at 48hr PP visit with Pediatrician. Mom states that it is difficult to feed baby on left side and that baby is very fussy at the beginning of feedings.   Feeding  [name] was fussy due to not eating per [facility] advisement so baby would eat at consult. After letting her calm down, LC and mom were able to get baby latched on mom's right side without any issues. Baby has an excellent latch. LC noticed mom's nipple becomes flat during feedings so a 24mm nipple shield was implemented to assist baby with keeping nipple in her mouth during the remainder of the feeding. Baby was able to stay on the rt side for approx. and intake was 15mL. LC halted feeding in order to work with breast that gives mom an issue (lft) Natalie Beck was more relaxed from being able to eat on opposite side and went to the breast w/o a problem. Baby stayed on that side for approx. and intake was 12mL. Totaling to 27mL, baby's goal per feeding in week 1 is 30mL, moving to 1-2oz in the next 48hrs.   LATCH Score  9   Baby ate with clothes on   Mom's nipple goes flat after reverse pressure   24mm nipple shield was used   Mom's breast were not sore/engorged     Interventions  24mm nipple shield, DEBP if supplementation is needed   Lactation Tools Discussed/Used  24mm nipple shield/DEBP is needed    Consult Status  Mom was encouraged to feed baby during quiet, alert times instead of waiting for baby to cry. Mom was instructed to roll wash rag under larger breast to lift larger breast, freeing her hand to assist with baby's latch and remainder of feeding. Mom was given instructions to continue bf on demand and to supp w/ her own milk if necessary. Mom was given info about feeding cues, signs  of fulfillment from baby, and support group information.     Natalie Beck 08/22/2017, 12:04 PM

## 2017-08-22 NOTE — Progress Notes (Signed)
Pt is here with c/o swelling in lower legs- worse in right. Denies headache or blurred vision. Pt is 5 days post delivery.

## 2017-08-25 ENCOUNTER — Encounter: Payer: Medicaid Other | Admitting: Certified Nurse Midwife

## 2017-08-25 NOTE — Progress Notes (Signed)
    OBSTETRICS/GYNECOLOGY POST-OPERATIVE CLINIC VISIT  Subjective:     Natalie Beck is a 27 y.o. female who presents to the clinic 5 days status post primary c-section, low transverse for arrest of dilation and fetal intolerance to labor. Eating a regular diet with difficulty. Bowel movements are normal. Pain is controlled with current analgesics. Medications being used: ibuprofen (OTC) and narcotic analgesics including oxycodone/acetaminophen (Percocet, Tylox).   Denies difficulty breathing or respiratory distress, chest pain, abdominal pain, excessive vaginal bleeding, dysuria, and leg pain or swelling.   The following portions of the patient's history were reviewed and updated as appropriate: allergies, current medications, past family history, past medical history, past social history, past surgical history and problem list.  Review of Systems Pertinent items are noted in HPI.    Objective:    BP (!) 94/59   Pulse 76   Wt 242 lb (109.8 kg)   BMI 45.73 kg/m    General:  alert and no distress  Abdomen: soft, bowel sounds active, non-tender  Incision:   healing well, no drainage, no erythema, no hernia, no seroma, no swelling, no dehiscence, incision well approximated  Lower extremities  negative Homan sign, no edema, no erythema   Assessment:    Doing well postoperatively.   Plan:   1. Continue any current medications. 2. Wound care discussed. 3. Discussed normal postpartum changes 4. Activity restrictions: no lifting more than 10 pounds 5. Anticipated return to work: 6-8 weeks. 6. Follow up: 5 weeks for PPV or sooner if needed.    Gunnar Bulla, CNM [facility], [facility]

## 2017-08-29 ENCOUNTER — Ambulatory Visit
Admission: RE | Admit: 2017-08-29 | Discharge: 2017-08-29 | Disposition: A | Discharge status: Home / Self Care | Payer: Medicaid Other | Source: Ambulatory Visit | Attending: Pediatrics | Admitting: Pediatrics

## 2017-08-29 NOTE — Lactation Note (Signed)
Lactation Consultation Note  Patient Name: Natalie Beck Today's Date: 08/29/2017     Maternal Data  Mom wanted initial visit to see if her flanges are the correct fit. Pt also wanted to do a wt check with baby before next Peds visit on Monday.   Feeding  Aria fed for on the rt breast and only took in 4mL. When mom switched baby to lft breast she was able to take 24mL and then she took another 6mL. Using 24mm nipple shield still.   LATCH Score  9                 Interventions  F/u with supplementation up to 1oz after nuring for on one side. Baby is burning calories at the breast.  Lactation Tools Discussed/Used  DEBP, nipple shield, bottle with slow flow nipple   Consult Status  Mom is to work on increasing milk supply over the weekend and check in with [facility] after baby's Ped appointment on Monday. Mom is to limit nursing sessions to on one side and f/u with slow flow bottle nipple up to 1oz, but can increase up to 1 additional oz for volume.     ADELIN VENTRELLA 08/29/2017, 12:54 PM

## 2017-09-25 ENCOUNTER — Ambulatory Visit (INDEPENDENT_AMBULATORY_CARE_PROVIDER_SITE_OTHER): Payer: Medicaid Other | Admitting: Certified Nurse Midwife

## 2017-09-25 DIAGNOSIS — Z98891 History of uterine scar from previous surgery: Secondary | ICD-10-CM | POA: Insufficient documentation

## 2017-09-25 NOTE — Progress Notes (Signed)
Subjective:    Natalie Beck is a 27 y.o. [id number] African American female who presents for a postpartum visit. She is 6 weeks postpartum following a primary cesarean section, low transverse incision at 40+3 gestational weeks. Anesthesia: epidural. I have fully reviewed the prenatal and intrapartum course.  Postpartum course has been uncomplicated. Baby's course has been complicated by lip and tongue ties; revised this week. Baby is feeding by breast. Bleeding no bleeding. Bowel function is normal. Bladder function is normal.   Patient is not sexually active. Contraception method is condoms. Postpartum depression screening: negative. Score 6.  Last pap 2018 and was normal.  Denies difficulty breathing or respiratory distress, chest pain, abdominal pain, excessive vaginal bleeding, dysuria, and leg pain or swelling.   The following portions of the patient's history were reviewed and updated as appropriate: allergies, current medications, past medical history, past surgical history and problem list.  Review of Systems  Pertinent items are noted in HPI.   Objective:   BP (!) 92/59   Pulse (!) 57   Ht 5\' 1"  (1.549 m)   Wt 224 lb 4 oz (101.7 kg)   Breastfeeding? Yes   BMI 42.37 kg/m   General:  alert, cooperative and no distress   Breasts:  deferred, no complaints  Lungs: clear to auscultation bilaterally  Heart:  regular rate and rhythm  Abdomen: soft, nontender   Vulva: normal  Vagina: normal vagina  Cervix:  closed  Corpus: Well-involuted  Adnexa:  Non-palpable       Office Visit from 09/25/2017 in [facility]  PHQ-9 Total Score  6        Assessment:   Postpartum exam Six (6) wks s/p primary c-section Breastfeeding Depression screening Contraception counseling   Plan:   Discussed routine health maintenance.   Encouraged use of postpartum garment; handout given.   Reviewed red flag symptoms and when to call.   RTC x 1 week for mood check.   RTC x 4  months for Annual Exam or sooner if needed.    Gunnar BullaJenkins Michelle Beck, CNM [facility], [facility]

## 2017-09-25 NOTE — Progress Notes (Signed)
Pt is here for a post partum visit. Is breast feeding.Has not had a period. Does not want birth control. Has not resumed intercourse. Screening 6

## 2017-09-25 NOTE — Patient Instructions (Addendum)
Preventive Care 18-39 Years, Female Preventive care refers to lifestyle choices and visits with your health care provider that can promote health and wellness. What does preventive care include?  A yearly physical exam. This is also called an annual well check.  Dental exams once or twice a year.  Routine eye exams. Ask your health care provider how often you should have your eyes checked.  Personal lifestyle choices, including: ? Daily care of your teeth and gums. ? Regular physical activity. ? Eating a healthy diet. ? Avoiding tobacco and drug use. ? Limiting alcohol use. ? Practicing safe sex. ? Taking vitamin and mineral supplements as recommended by your health care provider. What happens during an annual well check? The services and screenings done by your health care provider during your annual well check will depend on your age, overall health, lifestyle risk factors, and family history of disease. Counseling Your health care provider may ask you questions about your:  Alcohol use.  Tobacco use.  Drug use.  Emotional well-being.  Home and relationship well-being.  Sexual activity.  Eating habits.  Work and work Statistician.  Method of birth control.  Menstrual cycle.  Pregnancy history.  Screening You may have the following tests or measurements:  Height, weight, and BMI.  Diabetes screening. This is done by checking your blood sugar (glucose) after you have not eaten for a while (fasting).  Blood pressure.  Lipid and cholesterol levels. These may be checked every 5 years starting at age 66.  Skin check.  Hepatitis C blood test.  Hepatitis B blood test.  Sexually transmitted disease (STD) testing.  BRCA-related cancer screening. This may be done if you have a family history of breast, ovarian, tubal, or peritoneal cancers.  Pelvic exam and Pap test. This may be done every 3 years starting at age 40. Starting at age 59, this may be done every 5  years if you have a Pap test in combination with an HPV test.  Discuss your test results, treatment options, and if necessary, the need for more tests with your health care provider. Vaccines Your health care provider may recommend certain vaccines, such as:  Influenza vaccine. This is recommended every year.  Tetanus, diphtheria, and acellular pertussis (Tdap, Td) vaccine. You may need a Td booster every 10 years.  Varicella vaccine. You may need this if you have not been vaccinated.  HPV vaccine. If you are 69 or younger, you may need three doses over 6 months.  Measles, mumps, and rubella (MMR) vaccine. You may need at least one dose of MMR. You may also need a second dose.  Pneumococcal 13-valent conjugate (PCV13) vaccine. You may need this if you have certain conditions and were not previously vaccinated.  Pneumococcal polysaccharide (PPSV23) vaccine. You may need one or two doses if you smoke cigarettes or if you have certain conditions.  Meningococcal vaccine. One dose is recommended if you are age 27-21 years and a first-year college student living in a residence hall, or if you have one of several medical conditions. You may also need additional booster doses.  Hepatitis A vaccine. You may need this if you have certain conditions or if you travel or work in places where you may be exposed to hepatitis A.  Hepatitis B vaccine. You may need this if you have certain conditions or if you travel or work in places where you may be exposed to hepatitis B.  Haemophilus influenzae type b (Hib) vaccine. You may need this if  you have certain risk factors.  Talk to your health care provider about which screenings and vaccines you need and how often you need them. This information is not intended to replace advice given to you by your health care provider. Make sure you discuss any questions you have with your health care provider. Document Released: 06/04/2001 Document Revised: 12/27/2015  Document Reviewed: 02/07/2015 Elsevier Interactive Patient Education  2018 [place] Depression When a woman feels excessive sadness, anger, or anxiety during pregnancy or during the first 12 months after she gives birth, she has a condition called perinatal depression. Depression can interfere with work, school, relationships, and other everyday activities. If it is not managed properly, it can also cause problems in the mother and her baby. Sometimes, perinatal depression is left untreated because symptoms are thought to be normal mood swings during and right after pregnancy. If you have symptoms of depression, it is important to talk with your health care provider. What are the causes? The exact cause of this condition is not known. Hormonal changes during and after pregnancy may play a role in causing perinatal depression. What increases the risk? You are more likely to develop this condition if:  You have a personal or family history of depression, anxiety, or mood disorders.  You experience a stressful life event during pregnancy, such as the death of a loved one.  You have a lot of regular life stress.  You do not have support from family members or loved ones, or you are in an abusive relationship.  What are the signs or symptoms? Symptoms of this condition include:  Feeling sad or hopeless.  Feelings of guilt.  Feeling irritable or overwhelmed.  Changes in your appetite.  Lack of energy or motivation.  Sleep problems.  Difficulty concentrating or completing tasks.  Loss of interest in hobbies or relationships.  Headaches or stomach problems that do not go away.  How is this diagnosed? This condition is diagnosed based on a physical exam and mental evaluation. In some cases, your health care provider may use a depression screening tool. These tools include a list of questions that can help a health care provider diagnose depression. Your health care  provider may refer you to a mental health expert who specializes in depression. How is this treated? This condition may be treated with:  Medicines. Your health care provider will only give you medicines that have been proven safe for pregnancy and breastfeeding.  Talk therapy with a mental health professional to help change your patterns of thinking (cognitive behavioral therapy).  Support groups.  Brain stimulation or light therapies.  Stress reduction therapies, such as mindfulness.  Follow these instructions at home: Lifestyle  Do not use any products that contain nicotine or tobacco, such as cigarettes and e-cigarettes. If you need help quitting, ask your health care provider.  Do not use alcohol when you are pregnant. After your baby is born, limit alcohol intake to no more than 1 drink a day. One drink equals 12 oz of beer, 5 oz of wine, or 1 oz of hard liquor.  Consider joining a support group for new mothers. Ask your health care provider for recommendations.  Take good care of yourself. Make sure you: ? Get plenty of sleep. If you are having trouble sleeping, talk with your health care provider. ? Eat a healthy diet. This includes plenty of fruits and vegetables, whole grains, and lean proteins. ? Exercise regularly, as told by your health care  provider. Ask your health care provider what exercises are safe for you. General instructions  Take over-the-counter and prescription medicines only as told by your health care provider.  Talk with your partner or family members about your feelings during pregnancy. Share any concerns or anxieties that you may have.  Ask for help with tasks or chores when you need it. Ask friends and family members to provide meals, watch your children, or help with cleaning.  Keep all follow-up visits as told by your health care provider. This is important. Contact a health care provider if:  You (or people close to you) notice that you have  any symptoms of depression.  You have depression and your symptoms get worse.  You experience side effects from medicines, such as nausea or sleep problems. Get help right away if:  You feel like hurting yourself, your baby, or someone else. If you ever feel like you may hurt yourself or others, or have thoughts about taking your own life, get help right away. You can go to your nearest emergency department or call:  Your local emergency services (911 in the [place]  A suicide crisis helpline, such as the [facility] at 424-121-5782. This is open 24 hours a day.  Summary  Perinatal depression is when a woman feels excessive sadness, anger, or anxiety during pregnancy or during the first 12 months after she gives birth.  If perinatal depression is not treated, it can lead to health problems for the mother and her baby.  This condition is treated with medicines, talk therapy, stress reduction therapies, or a combination of two or more treatments.  Talk with your partner or family members about your feelings. Do not be afraid to ask for help. This information is not intended to replace advice given to you by your health care provider. Make sure you discuss any questions you have with your health care provider. Document Released: 06/05/2016 Document Revised: 06/05/2016 Document Reviewed: 06/05/2016 Elsevier Interactive Patient Education  [employer].

## 2017-09-26 ENCOUNTER — Ambulatory Visit: Payer: Self-pay

## 2017-09-26 NOTE — Lactation Note (Signed)
This note was copied from a baby's chart. Lactation Consultation Note  Patient Name: Natalie Beck ONGEX'BToday's Date: 09/26/2017     Maternal Data  Mom has been working on increasing milk supply by pumping frequently and [facility]. Baby recently had frenectomy of lip and tongue done on 6/3. Ped dentist [facility].  Feeding  [name] was able to stay latched on rt breast for 10mins, but only took in 4mL mom says this is the breast that makes the least and can generally pump 1.5oz. [name] switched to left breast for 10mins and at 7mins had taken in 30mL. She then returned to the same breast and took another 12mL for a grand total of 46mL. Baby seemed very content and spit up a minimal amount after burping.   LATCH Score  9   LC had to help position baby and flip baby's upper lip up.               Interventions  None  Lactation Tools Discussed/Used  DEBP 2xs a day to keep mom's milk supply on the path of increasing and f/u with 1-1.5oz when baby still seems hungry or output decreases.    Consult Status   Natalie Beck is able to nurse better (without nipple shield) after having frenectomies of upper lip and tongue done. Mom has also noticed an increase in her milk supply since the revision due to baby being able to create a better seal and stay latched onto breast.  Mom is to continue nursing baby on demand and listening for frequent swallows at the breast. Mom can f/u with formula or expressed milk when baby still seems hungry after feedings.  Mom is also attend breastfeeding support groups and has access to LCs in the community to support her breastfeeding journey.    Natalie Beck 09/26/2017, 11:21 AM

## 2017-10-30 ENCOUNTER — Encounter: Payer: Medicaid Other | Admitting: Certified Nurse Midwife

## 2018-01-26 ENCOUNTER — Encounter: Payer: Medicaid Other | Admitting: Certified Nurse Midwife

## 2018-08-11 ENCOUNTER — Ambulatory Visit
Admission: RE | Admit: 2018-08-11 | Discharge: 2018-08-11 | Disposition: A | Discharge status: Home / Self Care | Payer: Self-pay | Source: Ambulatory Visit | Attending: Certified Nurse Midwife | Admitting: Certified Nurse Midwife

## 2018-08-11 ENCOUNTER — Other Ambulatory Visit: Payer: Self-pay | Admitting: Certified Nurse Midwife

## 2018-08-11 ENCOUNTER — Other Ambulatory Visit: Payer: Self-pay

## 2018-08-11 NOTE — Lactation Note (Signed)
Lactation Consultation Note  Patient Name: Natalie Beck Today's Date: 08/11/2018     Maternal Data  Pt complains of recurring clogged milk ducts and states that she feels them: under her arms, between her legs, and in her back at times. Patient has been taking 1200mg  Lecithin 2xs a day for the past 34mos and doesn't think it is helping now. Pt wants to find relief for clogged ducts. Baby will be 66mos old and is feeding less frequently and not feeding at night at all.   Feeding  Baby had fed and was napping during virtual consultation  LATCH Score  0                 Interventions  cold/warm compress, massagers, electric toothbrush  Lactation Tools Discussed/Used  DEBP   Consult Status  After speaking with pt in detail LC found out that pt was taking 2 doses of lecithin and should be taking 1 dose q6hrs. LC also discussed the human milk line and shared links with pt so she could see why she's "feeling" clogged ducts in these areas of her body.   Additionally, LC reviewed cutting down on fats in the diet because these could cause issues of reoccurring clogged ducts.   LC told pt to try: massage, nursing baby with nose pointed towards lumps, wearing a tight, supportive bra, pumping until comfortable, using a massager or an electric toothbrush to assist with loosening the lumps.  Pt is going to join virtual support groups to gain constant support.     Natalie Beck 08/11/2018, 3:29 PM

## 2018-08-11 NOTE — Progress Notes (Signed)
lac

## 2018-09-30 ENCOUNTER — Ambulatory Visit
Admission: RE | Admit: 2018-09-30 | Discharge: 2018-09-30 | Disposition: A | Discharge status: Home / Self Care | Payer: Medicaid Other | Source: Ambulatory Visit | Attending: Certified Nurse Midwife | Admitting: Certified Nurse Midwife

## 2018-09-30 ENCOUNTER — Other Ambulatory Visit: Payer: Self-pay

## 2018-09-30 DIAGNOSIS — H9201 Otalgia, right ear: Secondary | ICD-10-CM | POA: Diagnosis not present

## 2018-09-30 DIAGNOSIS — M542 Cervicalgia: Secondary | ICD-10-CM | POA: Insufficient documentation

## 2018-09-30 NOTE — Lactation Note (Signed)
Lactation Consultation Note  Patient Name: Natalie Beck Today's Date: 09/30/2018     Maternal Data  Patient complains of pressure due to milk in several areas of her body: behind her ear, in her head, under her eyebrow, vulva, anus, ankles, and thighs. Patient states that she "can massage milk in areas of her body and move it towards her nipple and has a letdown."   Feeding   n/a LATCH Score   n/a                Interventions  n/a  Lactation Tools Discussed/Used  n/a   Consult Status  LC called patient's OB care provider during call and devised plan to bring patient in on Monday for labs to give further clarification as to what is happening. Patient has been instructed to continue the use of pain relief meds in the interim.     Natalie Beck 09/30/2018, 3:39 PM

## 2018-10-02 ENCOUNTER — Telehealth: Payer: Self-pay | Admitting: Certified Nurse Midwife

## 2018-10-02 MED ORDER — LORAZEPAM 0.5 MG PO TABS: 0.5000 mg | ORAL_TABLET | Freq: Three times a day (TID) | ORAL | 0 refills | Status: DC | PRN

## 2018-10-02 MED ORDER — METOCLOPRAMIDE HCL 10 MG PO TABS: 10.0000 mg | ORAL_TABLET | Freq: Three times a day (TID) | ORAL | 0 refills | Status: DC | PRN

## 2018-10-02 MED ORDER — KETOROLAC TROMETHAMINE 10 MG PO TABS: 10.0000 mg | ORAL_TABLET | Freq: Four times a day (QID) | ORAL | 0 refills | Status: DC | PRN

## 2018-10-02 NOTE — Telephone Encounter (Addendum)
0900 Telephone call received from patient. Verified full name and date of birth.   Reports headache since 0430 due to engorgement and "backed up" breast milk. "Able to move milk" in small amounts.   Alert and oriented x 4, no visual changes. Note slight relief of symptoms with PO Tylenol per package instructions.   Rx: Reglan, Toradol, and Ativan, see orders.   Reviewed red flag symptoms and when to call.   Follow up for appointment on Monday as previously scheduled.   Diona Fanti, CNM [facility], [facility] 10/02/18 11:27 AM

## 2018-10-05 ENCOUNTER — Other Ambulatory Visit: Payer: Self-pay

## 2018-10-05 ENCOUNTER — Ambulatory Visit (INDEPENDENT_AMBULATORY_CARE_PROVIDER_SITE_OTHER): Payer: Medicaid Other | Admitting: Certified Nurse Midwife

## 2018-10-05 ENCOUNTER — Encounter: Payer: Self-pay | Admitting: Certified Nurse Midwife

## 2018-10-05 DIAGNOSIS — Z9189 Other specified personal risk factors, not elsewhere classified: Secondary | ICD-10-CM | POA: Diagnosis not present

## 2018-10-05 DIAGNOSIS — Z6841 Body Mass Index (BMI) 40.0 and over, adult: Secondary | ICD-10-CM | POA: Diagnosis not present

## 2018-10-05 DIAGNOSIS — H9201 Otalgia, right ear: Secondary | ICD-10-CM

## 2018-10-05 DIAGNOSIS — M542 Cervicalgia: Secondary | ICD-10-CM | POA: Diagnosis not present

## 2018-10-05 MED ORDER — CEFDINIR 300 MG PO CAPS: 300.0000 mg | ORAL_CAPSULE | Freq: Two times a day (BID) | ORAL | 0 refills | Status: AC

## 2018-10-05 MED ORDER — CLARITIN-D 24 HOUR 10-240 MG PO TB24
1.0000 | ORAL_TABLET | Freq: Every day | ORAL | 0 refills | Status: DC
Start: 2018-10-05 — End: 2019-06-08

## 2018-10-05 NOTE — Patient Instructions (Signed)
Cefdinir capsules What is this medicine? CEFDINIR (SEF di ner) is a cephalosporin antibiotic. It is used to treat certain kinds of bacterial infections. It will not work for colds, flu, or other viral infections. This medicine may be used for other purposes; ask your health care provider or pharmacist if you have questions. COMMON BRAND NAME(S): Omnicef What should I tell my health care provider before I take this medicine? They need to know if you have any of these conditions: -bleeding problems -kidney disease -stomach or intestine problems (especially colitis) -an unusual or allergic reaction to cefdinir, other cephalosporin antibiotics, penicillin, penicillamine, other foods, dyes or preservatives -pregnant or trying to get pregnant -breast-feeding How should I use this medicine? Take this medicine by mouth. Swallow it with a drink of water. Follow the directions on the prescription label. You can take it with or without food. If it upsets your stomach it may help to take it with food. Take your doses at regular intervals. Do not take it more often than directed. Finish all the medicine you are prescribed even if you think your infection is better. Talk to your pediatrician regarding the use of this medicine in children. Special care may be needed. Overdosage: If you think you have taken too much of this medicine contact a poison control center or emergency room at once. NOTE: This medicine is only for you. Do not share this medicine with others. What if I miss a dose? If you miss a dose, take it as soon as you can. If it is almost time for your next dose, take only that dose. Do not take double or extra doses. What may interact with this medicine? -antacids that contain aluminum or magnesium -iron supplements -other antibiotics -probenecid This list may not describe all possible interactions. Give your health care provider a list of all the medicines, herbs, non-prescription drugs, or  dietary supplements you use. Also tell them if you smoke, drink alcohol, or use illegal drugs. Some items may interact with your medicine. What should I watch for while using this medicine? Tell your doctor or health care professional if your symptoms do not get better in a few days. If you are diabetic you may get a false-positive result for sugar in your urine. Check with your doctor or health care professional before you change your diet or the dose of your diabetes medicine. What side effects may I notice from receiving this medicine? Side effects that you should report to your doctor or health care professional as soon as possible: -allergic reactions like skin rash, itching or hives, swelling of the face, lips, or tongue -bloody or watery diarrhea -breathing problems -fever -redness, blistering, peeling or loosening of the skin, including inside the mouth -seizures -trouble passing urine or change in the amount of urine -unusual bleeding or bruising -unusually weak or tired Side effects that usually do not require medical attention (report to your doctor or health care professional if they continue or are bothersome): -constipation -diarrhea -dizziness -dry mouth -headache -loss of appetite -nausea, vomiting -stomach pain -stool discoloration -tiredness -vaginal discharge, itching, or odor in women This list may not describe all possible side effects. Call your doctor for medical advice about side effects. You may report side effects to FDA at 1-800-FDA-1088. Where should I keep my medicine? Keep out of the reach of children. Store at room temperature between 15 and 30 degrees C (59 and 86 degrees F). Throw the medicine away after the expiration date. NOTE: This sheet  is a summary. It may not cover all possible information. If you have questions about this medicine, talk to your doctor, pharmacist, or health care provider.  2019 Elsevier/Gold Standard (2015-08-14  15:52:44) Loratadine capsules or tablets What is this medicine? LORATADINE (lor AT a deen) is an antihistamine. It helps to relieve sneezing, runny nose, and itchy, watery eyes. This medicine is used to treat the symptoms of allergies. It is also used to treat itchy skin rash and hives. This medicine may be used for other purposes; ask your health care provider or pharmacist if you have questions. COMMON BRAND NAME(S): Alavert, Allergy Relief, Claritin, Claritin Hives Relief, Claritin Liqui-Gel, Claritin-D 24 Hour, Clear-Atadine, QlearQuil All Day & All Night Allergy Relief, Tavist ND What should I tell my health care provider before I take this medicine? They need to know if you have any of these conditions: -asthma -kidney disease -liver disease -an unusual or allergic reaction to loratadine, other antihistamines, other medicines, foods, dyes, or preservatives -pregnant or trying to get pregnant -breast-feeding How should I use this medicine? Take this medicine by mouth with a glass of water. Follow the directions on the label. You may take this medicine with food or on an empty stomach. Take your medicine at regular intervals. Do not take your medicine more often than directed. Talk to your pediatrician regarding the use of this medicine in children. While this medicine may be used in children as young as 6 years for selected conditions, precautions do apply. Overdosage: If you think you have taken too much of this medicine contact a poison control center or emergency room at once. NOTE: This medicine is only for you. Do not share this medicine with others. What if I miss a dose? If you miss a dose, take it as soon as you can. If it is almost time for your next dose, take only that dose. Do not take double or extra doses. What may interact with this medicine? -other medicines for colds or allergies This list may not describe all possible interactions. Give your health care provider a list  of all the medicines, herbs, non-prescription drugs, or dietary supplements you use. Also tell them if you smoke, drink alcohol, or use illegal drugs. Some items may interact with your medicine. What should I watch for while using this medicine? Tell your doctor or healthcare professional if your symptoms do not start to get better or if they get worse. Your mouth may get dry. Chewing sugarless gum or sucking hard candy, and drinking plenty of water may help. Contact your doctor if the problem does not go away or is severe. You may get drowsy or dizzy. Do not drive, use machinery, or do anything that needs mental alertness until you know how this medicine affects you. Do not stand or sit up quickly, especially if you are an older patient. This reduces the risk of dizzy or fainting spells. What side effects may I notice from receiving this medicine? Side effects that you should report to your doctor or health care professional as soon as possible: -allergic reactions like skin rash, itching or hives, swelling of the face, lips, or tongue -breathing problems -unusually restless or nervous Side effects that usually do not require medical attention (report to your doctor or health care professional if they continue or are bothersome): -drowsiness -dry or irritated mouth or throat -headache This list may not describe all possible side effects. Call your doctor for medical advice about side effects. You may report  side effects to FDA at 1-800-FDA-1088. Where should I keep my medicine? Keep out of the reach of children. Store at room temperature between 2 and 30 degrees C (36 and 86 degrees F). Protect from moisture. Throw away any unused medicine after the expiration date. NOTE: This sheet is a summary. It may not cover all possible information. If you have questions about this medicine, talk to your doctor, pharmacist, or health care provider.  2019 Elsevier/Gold Standard (2007-10-12 17:17:24)

## 2018-10-05 NOTE — Progress Notes (Signed)
GYN ENCOUNTER NOTE  Subjective:       Natalie Beck is a 28 y.o. G72P1001 female is here for gynecologic evaluation of the following issues:  1. Breastfeeding issues 2. Right sided neck pain 3. Right sided ear pain  Reports worsening symptoms over the last few months, worst symptoms over the last three weeks. Originally, seen by Lactation for clogged ducts and noted swelling to inner thighs.   Patient advised that swelling could be normal due to human milk lines. She reports an "engorged like feeling to her thighs, arms, and temple". States "she feels like fluid is sitting behind her eyes".   Symptoms are mildly relieved by  "massaging milk towards her breasts" or allowing her daughter to nurse. Started taking Toradol and Ativan on Friday with "some relief" to symptoms.  States episode of engorgement accompanied by "panic attack" while driving with her daughter in the car is what made her seek care.    History positive for wisdom teeth removal, dental implant and "bad cavity needing crown".   Denies difficulty breathing or respiratory distress, chest pain, abdominal pain, excessive vaginal bleeding, dysuria, and leg pain or swelling.    Gynecologic History  No LMP recorded. (Menstrual status: Lactating).  Contraception: Lactation amenorrhea  Last Pap: 2018. Results were: normal  Obstetric History  OB History  Gravida Para Term Preterm AB Living  1 1 1     1   SAB TAB Ectopic Multiple Live Births        0 1    # Outcome Date GA Lbr Len/2nd Weight Sex Delivery Anes PTL Lv  1 Term 08/17/17 [redacted]w[redacted]d  6 lb 5.9 oz (2.89 kg) F CS-LTranv Spinal  LIV    Past Medical History:  Diagnosis Date  . Anemia   . Asthma     Past Surgical History:  Procedure Laterality Date  . CESAREAN SECTION N/A 08/17/2017   Procedure: CESAREAN SECTION;  Surgeon: Harlin Heys, MD;  Location: [facility] ORS;  Service: Obstetrics;  Laterality: N/A;  . dental implant    . WISDOM TOOTH EXTRACTION       Current Outpatient Medications on File Prior to Visit  Medication Sig Dispense Refill  . albuterol (PROVENTIL HFA;VENTOLIN HFA) 108 (90 Base) MCG/ACT inhaler Inhale into the lungs every 6 (six) hours as needed for wheezing or shortness of breath.    Marland Kitchen ketorolac (TORADOL) 10 MG tablet Take 1 tablet (10 mg total) by mouth every 6 (six) hours as needed. 20 tablet 0  . LORazepam (ATIVAN) 0.5 MG tablet Take 1 tablet (0.5 mg total) by mouth every 8 (eight) hours as needed for anxiety or sleep. 20 tablet 0  . metoCLOPramide (REGLAN) 10 MG tablet Take 1 tablet (10 mg total) by mouth every 8 (eight) hours as needed for vomiting (take with toradol for headache). 10 tablet 0  . Prenatal Vit-Fe Fumarate-FA (PRENATAL VITAMINS PLUS PO) Take by mouth.     No current facility-administered medications on file prior to visit.     Allergies  Allergen Reactions  . Citrus Dermatitis    Hypoallergenic dermatitis-tongue swells and itches    Social History   Socioeconomic History  . Marital status: Single    Spouse name: Not on file  . Number of children: Not on file  . Years of education: Not on file  . Highest education level: Not on file  Occupational History  . Not on file  Social Needs  . Financial resource strain: Not on file  .  Food insecurity    Worry: Not on file    Inability: Not on file  . Transportation needs    Medical: Not on file    Non-medical: Not on file  Tobacco Use  . Smoking status: Former Games developermoker  . Smokeless tobacco: Never Used  Substance and Sexual Activity  . Alcohol use: Yes    Alcohol/week: 2.0 standard drinks    Types: 2 Standard drinks or equivalent per week  . Drug use: No  . Sexual activity: Yes    Partners: Male    Birth control/protection: None  Lifestyle  . Physical activity    Days per week: Not on file    Minutes per session: Not on file  . Stress: Not on file  Relationships  . Social Musicianconnections    Talks on phone: Not on file    Gets together:  Not on file    Attends religious service: Not on file    Active member of club or organization: Not on file    Attends meetings of clubs or organizations: Not on file    Relationship status: Not on file  . Intimate partner violence    Fear of current or ex partner: Not on file    Emotionally abused: Not on file    Physically abused: Not on file    Forced sexual activity: Not on file  Other Topics Concern  . Not on file  Social History Narrative  . Not on file    Family History  Problem Relation Age of Onset  . Asthma Father   . Asthma Sister   . Hypertension Sister   . Rheum arthritis Paternal Grandmother   . Cancer Paternal Grandmother        lung  . Cirrhosis Maternal Grandmother   . Cancer Other        maternal great grandmother-leukemia  . Cancer Paternal Grandfather   . Breast cancer Neg Hx   . Ovarian cancer Neg Hx     The following portions of the patient's history were reviewed and updated as appropriate: allergies, current medications, past family history, past medical history, past social history, past surgical history and problem list.  Review of Systems  ROS negative except as noted above. Information obtained from patient.   Objective:   BP (!) 77/58   Pulse 66   Ht 5' (1.524 m)   Wt 226 lb 4.8 oz (102.6 kg)   Breastfeeding Yes   BMI 44.20 kg/m    CONSTITUTIONAL: Well-developed, well-nourished female in no acute distress.   HENT:  Normocephalic, atraumatic. Bilateral red, bulging tympanic membranes  NECK: Normal range of motion, supple, no masses.  Normal thyroid. Tenderness with palpation of cervical lymph nodes.   SKIN: Skin is warm and dry. No rash noted. Not diaphoretic. No erythema. No pallor.  NEUROLGIC: Alert and oriented to person, place, and time.   PSYCHIATRIC: Normal mood and affect. Normal behavior. Normal judgment and thought content.  ABDOMEN: Soft, non distended; Non tender.  No Organomegaly.  PELVIC:  External Genitalia:  Normal  MUSCULOSKELETAL: Normal range of motion. No tenderness.  No cyanosis, clubbing, or edema.  GAD 7 : Generalized Anxiety Score 10/05/2018  Nervous, Anxious, on Edge 1  Control/stop worrying 2  Worry too much - different things 1  Trouble relaxing 1  Restless 0  Easily annoyed or irritable 1  Afraid - awful might happen 1  Total GAD 7 Score 7  Anxiety Difficulty Not difficult at all    Depression  screen [facility] 2/9 10/05/2018 09/25/2017  Decreased Interest 2 1  Down, Depressed, Hopeless 1 1  PHQ - 2 Score 3 2  Altered sleeping 2 1  Tired, decreased energy 2 1  Change in appetite 2 0  Feeling bad or failure about yourself  1 1  Trouble concentrating 1 1  Moving slowly or fidgety/restless 0 0  Suicidal thoughts 0 0  PHQ-9 Score 11 6  Difficult doing work/chores Somewhat difficult -     Assessment:   1. Lactating mother - Thyroid Panel With TSH - CBC - Comprehensive metabolic panel - Prolactin - VITAMIN D 25 Hydroxy (Vit-D Deficiency, Fractures)  2. Breastfeeding problem - Thyroid Panel With TSH - CBC - Comprehensive metabolic panel - Prolactin - VITAMIN D 25 Hydroxy (Vit-D Deficiency, Fractures)  3. Right ear pain  4. Neck pain on right side  5. Class 3 severe obesity without serious comorbidity with body mass index (BMI) of 40.0 to 44.9 in adult, unspecified obesity type (HCC) - Thyroid Panel With TSH   Plan:   Discussed possible reasons for symptoms and home treatment measures.   Will collect labs and contact patient with results. Will refer to Endocrinology and ENT after results.   Encouraged follow up with Dentist.   Continue Toradol and Ativan as needed.   Rx: Claritin and Omnicef, see orders.   Reviewed red flag symptoms and when to call.   RTC as needed.    Gunnar BullaJenkins Michelle Casmira Cramer, CNM [facility], [facility] 10/05/18 7:13 PM

## 2018-10-05 NOTE — Progress Notes (Signed)
Patient c/o difficulty breast feeding, right sided neck pain and feels pressure in right ear.

## 2018-10-06 LAB — CBC
Hematocrit: 38.2 % (ref 34.0–46.6)
Hemoglobin: 12.4 g/dL (ref 11.1–15.9)
MCH: 30.2 pg (ref 26.6–33.0)
MCHC: 32.5 g/dL (ref 31.5–35.7)
MCV: 93 fL (ref 79–97)
Platelets: 223 x10E3/uL (ref 150–450)
RBC: 4.1 x10E6/uL (ref 3.77–5.28)
RDW: 14.9 % (ref 11.7–15.4)
WBC: 7.7 x10E3/uL (ref 3.4–10.8)

## 2018-10-06 LAB — COMPREHENSIVE METABOLIC PANEL
ALT: 13 IU/L (ref 0–32)
AST: 15 IU/L (ref 0–40)
Albumin/Globulin Ratio: 1.6 (ref 1.2–2.2)
Albumin: 4.1 g/dL (ref 3.9–5.0)
Alkaline Phosphatase: 102 IU/L (ref 39–117)
BUN/Creatinine Ratio: 13 (ref 9–23)
BUN: 10 mg/dL (ref 6–20)
Bilirubin Total: 0.2 mg/dL (ref 0.0–1.2)
CO2: 22 mmol/L (ref 20–29)
Calcium: 9.6 mg/dL (ref 8.7–10.2)
Chloride: 106 mmol/L (ref 96–106)
Creatinine, Ser: 0.8 mg/dL (ref 0.57–1.00)
GFR calc Af Amer: 117 mL/min/{1.73_m2} (ref >59)
GFR calc non Af Amer: 101 mL/min/{1.73_m2} (ref >59)
Globulin, Total: 2.6 g/dL (ref 1.5–4.5)
Glucose: 86 mg/dL (ref 65–99)
Potassium: 4.2 mmol/L (ref 3.5–5.2)
Sodium: 142 mmol/L (ref 134–144)
Total Protein: 6.7 g/dL (ref 6.0–8.5)

## 2018-10-06 LAB — THYROID PANEL WITH TSH
Free Thyroxine Index: 1.8 (ref 1.2–4.9)
T3 Uptake Ratio: 24 % (ref 24–39)
T4, Total: 7.3 ug/dL (ref 4.5–12.0)
TSH: 2.33 u[IU]/mL (ref 0.450–4.500)

## 2018-10-06 LAB — VITAMIN D 25 HYDROXY (VIT D DEFICIENCY, FRACTURES): Vit D, 25-Hydroxy: 13.4 ng/mL — ABNORMAL LOW (ref 30.0–100.0)

## 2018-10-06 LAB — PROLACTIN: Prolactin: 139.9 ng/mL — ABNORMAL HIGH (ref 4.8–23.3)

## 2018-10-08 ENCOUNTER — Telehealth: Payer: Self-pay | Admitting: Certified Nurse Midwife

## 2018-10-08 ENCOUNTER — Encounter: Payer: Self-pay | Admitting: Certified Nurse Midwife

## 2018-10-08 ENCOUNTER — Other Ambulatory Visit: Payer: Self-pay | Admitting: Certified Nurse Midwife

## 2018-10-08 DIAGNOSIS — R7989 Other specified abnormal findings of blood chemistry: Secondary | ICD-10-CM

## 2018-10-08 DIAGNOSIS — E559 Vitamin D deficiency, unspecified: Secondary | ICD-10-CM

## 2018-10-08 MED ORDER — VITAMIN D (ERGOCALCIFEROL) 1.25 MG (50000 UNIT) PO CAPS: 50000.0000 [IU] | ORAL_CAPSULE | ORAL | 0 refills | Status: DC

## 2018-10-08 NOTE — Telephone Encounter (Signed)
Telephone call to identified line x 2. Voicemail unavailable. Will send message via [place].    Diona Fanti, CNM [facility], [facility] 10/08/18 4:53 PM

## 2018-10-09 ENCOUNTER — Other Ambulatory Visit: Payer: Self-pay | Admitting: Certified Nurse Midwife

## 2018-10-09 MED ORDER — FLUCONAZOLE 150 MG PO TABS: 150.0000 mg | ORAL_TABLET | Freq: Once | ORAL | 0 refills | Status: AC

## 2018-10-20 NOTE — Progress Notes (Signed)
LMTRC to schedule.-KEC

## 2018-10-30 ENCOUNTER — Other Ambulatory Visit: Payer: Self-pay

## 2018-10-30 ENCOUNTER — Other Ambulatory Visit: Payer: Medicaid Other

## 2018-10-30 DIAGNOSIS — R7989 Other specified abnormal findings of blood chemistry: Secondary | ICD-10-CM

## 2018-10-30 DIAGNOSIS — E229 Hyperfunction of pituitary gland, unspecified: Secondary | ICD-10-CM | POA: Diagnosis not present

## 2018-10-31 LAB — PROLACTIN: Prolactin: 40.9 ng/mL — ABNORMAL HIGH (ref 4.8–23.3)

## 2018-11-05 ENCOUNTER — Encounter: Payer: Self-pay | Admitting: Certified Nurse Midwife

## 2018-11-05 DIAGNOSIS — R7989 Other specified abnormal findings of blood chemistry: Secondary | ICD-10-CM | POA: Insufficient documentation

## 2018-11-09 ENCOUNTER — Encounter: Payer: Self-pay | Admitting: Certified Nurse Midwife

## 2018-11-09 ENCOUNTER — Other Ambulatory Visit: Payer: Self-pay

## 2018-11-09 MED ORDER — FLUCONAZOLE 150 MG PO TABS: 150.0000 mg | ORAL_TABLET | Freq: Once | ORAL | 0 refills | Status: AC

## 2018-11-09 NOTE — Telephone Encounter (Signed)
Yes, please send in Rx Diflucan. Thanks, JML

## 2018-12-01 ENCOUNTER — Other Ambulatory Visit: Payer: Self-pay

## 2018-12-01 ENCOUNTER — Encounter: Payer: Self-pay | Admitting: Certified Nurse Midwife

## 2018-12-01 DIAGNOSIS — Z9189 Other specified personal risk factors, not elsewhere classified: Secondary | ICD-10-CM

## 2018-12-01 DIAGNOSIS — R7989 Other specified abnormal findings of blood chemistry: Secondary | ICD-10-CM

## 2018-12-01 NOTE — Telephone Encounter (Signed)
Yes, please forward to [employer]. Thanks, JML

## 2018-12-04 ENCOUNTER — Encounter: Payer: Self-pay | Admitting: Certified Nurse Midwife

## 2018-12-04 ENCOUNTER — Other Ambulatory Visit: Payer: Self-pay

## 2018-12-04 DIAGNOSIS — R7989 Other specified abnormal findings of blood chemistry: Secondary | ICD-10-CM

## 2018-12-08 ENCOUNTER — Other Ambulatory Visit: Payer: Self-pay | Admitting: Certified Nurse Midwife

## 2018-12-08 ENCOUNTER — Telehealth: Payer: Self-pay | Admitting: Certified Nurse Midwife

## 2018-12-08 DIAGNOSIS — R7989 Other specified abnormal findings of blood chemistry: Secondary | ICD-10-CM

## 2018-12-08 NOTE — Telephone Encounter (Signed)
Called and spoke with patient.  Patient states that [facility] has no appointments until December.  Patient is requesting another referral to [facility], they can see her September 1st.  Do I just order an external referral?  Thanks. Jennye Moccasin

## 2018-12-08 NOTE — Telephone Encounter (Signed)
The patient called and stated that she needs to speak with Sharyn Lull in regards to her needing a endocrinology referral. Pt requesting call back. Please advise.

## 2018-12-08 NOTE — Telephone Encounter (Signed)
What do I need to do to get referral location changed? JML

## 2018-12-08 NOTE — Telephone Encounter (Signed)
Please notified pt that new referral has been placed as requested. Thanks, JML

## 2018-12-08 NOTE — Telephone Encounter (Signed)
I need a new referral entered for the new location. Thanks

## 2019-01-11 DIAGNOSIS — R7989 Other specified abnormal findings of blood chemistry: Secondary | ICD-10-CM | POA: Diagnosis not present

## 2019-01-14 ENCOUNTER — Telehealth: Payer: Self-pay | Admitting: Certified Nurse Midwife

## 2019-01-14 NOTE — Telephone Encounter (Signed)
Anne Ng from [facility] called to confirm patient is a patient here prior to sending fax.

## 2019-02-15 ENCOUNTER — Encounter: Payer: Medicaid Other | Admitting: Certified Nurse Midwife

## 2019-02-22 ENCOUNTER — Other Ambulatory Visit: Payer: Self-pay

## 2019-02-22 ENCOUNTER — Encounter: Payer: Self-pay | Admitting: Certified Nurse Midwife

## 2019-02-22 ENCOUNTER — Ambulatory Visit (INDEPENDENT_AMBULATORY_CARE_PROVIDER_SITE_OTHER): Payer: Medicaid Other | Admitting: Certified Nurse Midwife

## 2019-02-22 ENCOUNTER — Other Ambulatory Visit (HOSPITAL_COMMUNITY)
Admission: RE | Admit: 2019-02-22 | Discharge: 2019-02-22 | Disposition: A | Discharge status: Home / Self Care | Payer: Medicaid Other | Source: Ambulatory Visit | Attending: Certified Nurse Midwife | Admitting: Certified Nurse Midwife

## 2019-02-22 VITALS — BP 82/57 | HR 66 | Ht 60.0 in | Wt 229.4 lb

## 2019-02-22 DIAGNOSIS — Z862 Personal history of diseases of the blood and blood-forming organs and certain disorders involving the immune mechanism: Secondary | ICD-10-CM | POA: Diagnosis not present

## 2019-02-22 DIAGNOSIS — R7989 Other specified abnormal findings of blood chemistry: Secondary | ICD-10-CM | POA: Diagnosis not present

## 2019-02-22 DIAGNOSIS — E559 Vitamin D deficiency, unspecified: Secondary | ICD-10-CM

## 2019-02-22 DIAGNOSIS — Z01419 Encounter for gynecological examination (general) (routine) without abnormal findings: Secondary | ICD-10-CM | POA: Insufficient documentation

## 2019-02-22 DIAGNOSIS — Z0000 Encounter for general adult medical examination without abnormal findings: Secondary | ICD-10-CM

## 2019-02-22 DIAGNOSIS — Z124 Encounter for screening for malignant neoplasm of cervix: Secondary | ICD-10-CM | POA: Insufficient documentation

## 2019-02-22 DIAGNOSIS — Z6841 Body Mass Index (BMI) 40.0 and over, adult: Secondary | ICD-10-CM

## 2019-02-22 NOTE — Progress Notes (Signed)
ANNUAL PREVENTATIVE CARE GYN  ENCOUNTER NOTE  Subjective:       Natalie Beck is a 28 y.o. G70P1001 female here for a routine annual gynecologic exam.  Current complaints: 1. Needs Pap smear 2. Reports engorgement and retained milk long milk lines; unproductive referral to Endocrinology  Has appointment scheduled with new PCP on 03/02/2019.   Denies difficulty breathing or respiratory distress, chest pain, abdominal pain, excessive vaginal bleeding, dysuria, and leg pain or swelling.    Gynecologic History  Patient's last menstrual period was 02/15/2019 (exact date). Period Cycle (Days): 28 Period Duration (Days): 7 Period Pattern: Regular Menstrual Flow: Heavy Menstrual Control: Maxi pad Dysmenorrhea: (!) Severe Dysmenorrhea Symptoms: Cramping  Contraception: coitus interruptus and Natural Family Planning  Last Pap: 2017. Results were: normal per patient   Obstetric History  OB History  Gravida Para Term Preterm AB Living  1 1 1     1   SAB TAB Ectopic Multiple Live Births        0 1    # Outcome Date GA Lbr Len/2nd Weight Sex Delivery Anes PTL Lv  1 Term 08/17/17 [redacted]w[redacted]d  6 lb 5.9 oz (2.89 kg) F CS-LTranv Spinal  LIV     Complications: Fetal Intolerance    Past Medical History:  Diagnosis Date  . Anemia   . Asthma     Past Surgical History:  Procedure Laterality Date  . CESAREAN SECTION N/A 08/17/2017   Procedure: CESAREAN SECTION;  Surgeon: 08/19/2017, MD;  Location: [facility] ORS;  Service: Obstetrics;  Laterality: N/A;  . dental implant    . WISDOM TOOTH EXTRACTION      Current Outpatient Medications on File Prior to Visit  Medication Sig Dispense Refill  . albuterol (PROVENTIL HFA;VENTOLIN HFA) 108 (90 Base) MCG/ACT inhaler Inhale into the lungs every 6 (six) hours as needed for wheezing or shortness of breath.    Linzie Collin ibuprofen (ADVIL) 800 MG tablet Take 800 mg by mouth as needed.    Marland Kitchen ketorolac (TORADOL) 10 MG tablet Take 1 tablet (10 mg total) by  mouth every 6 (six) hours as needed. 20 tablet 0  . loratadine-pseudoephedrine (CLARITIN-D 24 HOUR) 10-240 MG 24 hr tablet Take 1 tablet by mouth daily. (Patient taking differently: Take 1 tablet by mouth as needed. ) 30 tablet 0  . LORazepam (ATIVAN) 0.5 MG tablet Take 1 tablet (0.5 mg total) by mouth every 8 (eight) hours as needed for anxiety or sleep. 20 tablet 0  . Prenatal Vit-Fe Fumarate-FA (PRENATAL VITAMINS PLUS PO) Take by mouth.    . Vitamin D, Ergocalciferol, (DRISDOL) 1.25 MG (50000 UT) CAPS capsule Take 1 capsule (50,000 Units total) by mouth every 7 (seven) days. 12 capsule 0   No current facility-administered medications on file prior to visit.     Allergies  Allergen Reactions  . Citrus Dermatitis    Hypoallergenic dermatitis-tongue swells and itches    Social History   Socioeconomic History  . Marital status: Single    Spouse name: Not on file  . Number of children: Not on file  . Years of education: Not on file  . Highest education level: Not on file  Occupational History  . Not on file  Social Needs  . Financial resource strain: Not on file  . Food insecurity    Worry: Not on file    Inability: Not on file  . Transportation needs    Medical: Not on file    Non-medical: Not on file  Tobacco Use  . Smoking status: Former Games developermoker  . Smokeless tobacco: Never Used  Substance and Sexual Activity  . Alcohol use: Yes    Alcohol/week: 2.0 standard drinks    Types: 2 Standard drinks or equivalent per week  . Drug use: No  . Sexual activity: Yes    Partners: Male    Birth control/protection: None  Lifestyle  . Physical activity    Days per week: Not on file    Minutes per session: Not on file  . Stress: Not on file  Relationships  . Social Musicianconnections    Talks on phone: Not on file    Gets together: Not on file    Attends religious service: Not on file    Active member of club or organization: Not on file    Attends meetings of clubs or organizations:  Not on file    Relationship status: Not on file  . Intimate partner violence    Fear of current or ex partner: Not on file    Emotionally abused: Not on file    Physically abused: Not on file    Forced sexual activity: Not on file  Other Topics Concern  . Not on file  Social History Narrative  . Not on file    Family History  Problem Relation Age of Onset  . Asthma Father   . Asthma Sister   . Hypertension Sister   . Rheum arthritis Paternal Grandmother   . Cancer Paternal Grandmother        lung  . Cirrhosis Maternal Grandmother   . Cancer Other        maternal great grandmother-leukemia  . Cancer Paternal Grandfather   . Breast cancer Neg Hx   . Ovarian cancer Neg Hx     The following portions of the patient's history were reviewed and updated as appropriate: allergies, current medications, past family history, past medical history, past social history, past surgical history and problem list.  Review of Systems  ROS negative except as noted above. Information obtained from patient.    Objective:   BP (!) 82/57   Pulse 66   Ht 5' (1.524 m)   Wt 229 lb 6.4 oz (104.1 kg)   LMP 02/15/2019 (Exact Date)   Breastfeeding Yes   BMI 44.80 kg/m    CONSTITUTIONAL: Well-developed, well-nourished female in no acute distress.   PSYCHIATRIC: Normal mood and affect. Normal behavior. Normal judgment and thought content.  NEUROLGIC: Alert and oriented to person, place, and time. Normal muscle tone coordination. No cranial nerve deficit noted.  HENT:  Normocephalic, atraumatic, External right and left ear normal.   EYES: Conjunctivae and EOM are normal. Pupils are equal and round.   NECK: Normal range of motion, supple, no masses.  Normal thyroid.   SKIN: Skin is warm and dry. No rash noted. Not diaphoretic. No erythema. No pallor.  CARDIOVASCULAR: Normal heart rate noted, regular  rhythm, no murmur.  RESPIRATORY: Clear to auscultation bilaterally. Effort and  breath  sounds normal, no problems with respiration noted.  BREASTS: Symmetric in size. No masses, skin changes,  nipple drainage, or lymphadenopathy. Lactating.   ABDOMEN: Soft, normal bowel sounds, no distention noted.  No tenderness, rebound or guarding. Obese.   PELVIC:  External Genitalia: Normal  Vagina: Normal  Cervix: Normal, Pap collected  Uterus: Normal  Adnexa: Normal   MUSCULOSKELETAL: Normal range of motion. No tenderness.  No cyanosis, clubbing, or edema.  2+ distal pulses.  LYMPHATIC: No Axillary, Supraclavicular,  or Inguinal Adenopathy.  Assessment:   Annual gynecologic examination 28 y.o.   Contraception: coitus interruptus and Natural Family Planning   Obesity 3   Problem List Items Addressed This Visit      Other   OBESITY, NOS    Other Visit Diagnoses    Well woman exam    -  Primary   Relevant Orders   Cytology - PAP   Screening for cervical cancer       Relevant Orders   Cytology - PAP      Plan:   Pap: Pap, Reflex if ASCUS  Labs: See orders   Routine preventative health maintenance measures emphasized: Exercise/Diet/Weight control, Tobacco Warnings, Alcohol/Substance use risks, Stress Management and Peer Pressure Issues; see AVS  Reviewed red flag symptoms and when to call  RTC x 1 year for ANNUAL EXAM or sooner if needed   Diona Fanti, CNM [facility], [facility] 02/22/19 10:22 AM

## 2019-02-22 NOTE — Patient Instructions (Signed)
Preventive Care 21-28 Years Old, Female Preventive care refers to visits with your health care provider and lifestyle choices that can promote health and wellness. This includes:  A yearly physical exam. This may also be called an annual well check.  Regular dental visits and eye exams.  Immunizations.  Screening for certain conditions.  Healthy lifestyle choices, such as eating a healthy diet, getting regular exercise, not using drugs or products that contain nicotine and tobacco, and limiting alcohol use. What can I expect for my preventive care visit? Physical exam Your health care provider will check your:  Height and weight. This may be used to calculate body mass index (BMI), which tells if you are at a healthy weight.  Heart rate and blood pressure.  Skin for abnormal spots. Counseling Your health care provider may ask you questions about your:  Alcohol, tobacco, and drug use.  Emotional well-being.  Home and relationship well-being.  Sexual activity.  Eating habits.  Work and work environment.  Method of birth control.  Menstrual cycle.  Pregnancy history. What immunizations do I need?  Influenza (flu) vaccine  This is recommended every year. Tetanus, diphtheria, and pertussis (Tdap) vaccine  You may need a Td booster every 10 years. Varicella (chickenpox) vaccine  You may need this if you have not been vaccinated. Human papillomavirus (HPV) vaccine  If recommended by your health care provider, you may need three doses over 6 months. Measles, mumps, and rubella (MMR) vaccine  You may need at least one dose of MMR. You may also need a second dose. Meningococcal conjugate (MenACWY) vaccine  One dose is recommended if you are age 19-21 years and a first-year college student living in a residence hall, or if you have one of several medical conditions. You may also need additional booster doses. Pneumococcal conjugate (PCV13) vaccine  You may need  this if you have certain conditions and were not previously vaccinated. Pneumococcal polysaccharide (PPSV23) vaccine  You may need one or two doses if you smoke cigarettes or if you have certain conditions. Hepatitis A vaccine  You may need this if you have certain conditions or if you travel or work in places where you may be exposed to hepatitis A. Hepatitis B vaccine  You may need this if you have certain conditions or if you travel or work in places where you may be exposed to hepatitis B. Haemophilus influenzae type b (Hib) vaccine  You may need this if you have certain conditions. You may receive vaccines as individual doses or as more than one vaccine together in one shot (combination vaccines). Talk with your health care provider about the risks and benefits of combination vaccines. What tests do I need?  Blood tests  Lipid and cholesterol levels. These may be checked every 5 years starting at age 20.  Hepatitis C test.  Hepatitis B test. Screening  Diabetes screening. This is done by checking your blood sugar (glucose) after you have not eaten for a while (fasting).  Sexually transmitted disease (STD) testing.  BRCA-related cancer screening. This may be done if you have a family history of breast, ovarian, tubal, or peritoneal cancers.  Pelvic exam and Pap test. This may be done every 3 years starting at age 21. Starting at age 30, this may be done every 5 years if you have a Pap test in combination with an HPV test. Talk with your health care provider about your test results, treatment options, and if necessary, the need for more tests.   Follow these instructions at home: Eating and drinking   Eat a diet that includes fresh fruits and vegetables, whole grains, lean protein, and low-fat dairy.  Take vitamin and mineral supplements as recommended by your health care provider.  Do not drink alcohol if: ? Your health care provider tells you not to drink. ? You are  pregnant, may be pregnant, or are planning to become pregnant.  If you drink alcohol: ? Limit how much you have to 0-1 drink a day. ? Be aware of how much alcohol is in your drink. In the [place] one drink equals one 12 oz bottle of beer (355 mL), one 5 oz glass of wine (148 mL), or one 1 oz glass of hard liquor (44 mL). Lifestyle  Take daily care of your teeth and gums.  Stay active. Exercise for at least 30 minutes on 5 or more days each week.  Do not use any products that contain nicotine or tobacco, such as cigarettes, e-cigarettes, and chewing tobacco. If you need help quitting, ask your health care provider.  If you are sexually active, practice safe sex. Use a condom or other form of birth control (contraception) in order to prevent pregnancy and STIs (sexually transmitted infections). If you plan to become pregnant, see your health care provider for a preconception visit. What's next?  Visit your health care provider once a year for a well check visit.  Ask your health care provider how often you should have your eyes and teeth checked.  Stay up to date on all vaccines. This information is not intended to replace advice given to you by your health care provider. Make sure you discuss any questions you have with your health care provider. Document Released: 06/04/2001 Document Revised: 12/18/2017 Document Reviewed: 12/18/2017 Elsevier Patient Education  2020 Elsevier Inc.  

## 2019-02-22 NOTE — Progress Notes (Signed)
Patient here for annual exam, no complaints.  

## 2019-02-26 LAB — CYTOLOGY - PAP
Adequacy: ABSENT
Diagnosis: NEGATIVE

## 2019-03-01 ENCOUNTER — Other Ambulatory Visit: Payer: Medicaid Other

## 2019-03-01 ENCOUNTER — Other Ambulatory Visit: Payer: Self-pay

## 2019-03-01 DIAGNOSIS — Z6841 Body Mass Index (BMI) 40.0 and over, adult: Secondary | ICD-10-CM | POA: Diagnosis not present

## 2019-03-01 DIAGNOSIS — E559 Vitamin D deficiency, unspecified: Secondary | ICD-10-CM | POA: Diagnosis not present

## 2019-03-01 DIAGNOSIS — Z01419 Encounter for gynecological examination (general) (routine) without abnormal findings: Secondary | ICD-10-CM | POA: Diagnosis not present

## 2019-03-01 DIAGNOSIS — Z862 Personal history of diseases of the blood and blood-forming organs and certain disorders involving the immune mechanism: Secondary | ICD-10-CM | POA: Diagnosis not present

## 2019-03-01 DIAGNOSIS — R7989 Other specified abnormal findings of blood chemistry: Secondary | ICD-10-CM | POA: Diagnosis not present

## 2019-03-02 ENCOUNTER — Ambulatory Visit: Payer: Medicaid Other | Admitting: Family Medicine

## 2019-03-02 LAB — CBC
Hematocrit: 36.9 % (ref 34.0–46.6)
Hemoglobin: 12.1 g/dL (ref 11.1–15.9)
MCH: 29.6 pg (ref 26.6–33.0)
MCHC: 32.8 g/dL (ref 31.5–35.7)
MCV: 90 fL (ref 79–97)
Platelets: 260 10*3/uL (ref 150–450)
RBC: 4.09 x10E6/uL (ref 3.77–5.28)
RDW: 13.5 % (ref 11.7–15.4)
WBC: 8.5 10*3/uL (ref 3.4–10.8)

## 2019-03-02 LAB — COMPREHENSIVE METABOLIC PANEL
ALT: 13 IU/L (ref 0–32)
AST: 14 IU/L (ref 0–40)
Albumin/Globulin Ratio: 1.4 (ref 1.2–2.2)
Albumin: 3.8 g/dL — ABNORMAL LOW (ref 3.9–5.0)
Alkaline Phosphatase: 94 IU/L (ref 39–117)
BUN/Creatinine Ratio: 14 (ref 9–23)
BUN: 11 mg/dL (ref 6–20)
Bilirubin Total: 0.4 mg/dL (ref 0.0–1.2)
CO2: 23 mmol/L (ref 20–29)
Calcium: 9.1 mg/dL (ref 8.7–10.2)
Chloride: 104 mmol/L (ref 96–106)
Creatinine, Ser: 0.77 mg/dL (ref 0.57–1.00)
GFR calc Af Amer: 122 mL/min/{1.73_m2} (ref >59)
GFR calc non Af Amer: 105 mL/min/{1.73_m2} (ref >59)
Globulin, Total: 2.7 g/dL (ref 1.5–4.5)
Glucose: 86 mg/dL (ref 65–99)
Potassium: 4.2 mmol/L (ref 3.5–5.2)
Sodium: 139 mmol/L (ref 134–144)
Total Protein: 6.5 g/dL (ref 6.0–8.5)

## 2019-03-02 LAB — FSH/LH
FSH: 6.8 m[IU]/mL
LH: 9.8 m[IU]/mL

## 2019-03-02 LAB — ESTRADIOL: Estradiol: 187 pg/mL

## 2019-03-02 LAB — LIPID PANEL
Chol/HDL Ratio: 4.5 ratio — ABNORMAL HIGH (ref 0.0–4.4)
Cholesterol, Total: 189 mg/dL (ref 100–199)
HDL: 42 mg/dL (ref >39)
LDL Chol Calc (NIH): 125 mg/dL — ABNORMAL HIGH (ref 0–99)
Triglycerides: 120 mg/dL (ref 0–149)
VLDL Cholesterol Cal: 22 mg/dL (ref 5–40)

## 2019-03-02 LAB — VITAMIN D 25 HYDROXY (VIT D DEFICIENCY, FRACTURES): Vit D, 25-Hydroxy: 18.7 ng/mL — ABNORMAL LOW (ref 30.0–100.0)

## 2019-03-02 LAB — THYROID PANEL WITH TSH
Free Thyroxine Index: 1.8 (ref 1.2–4.9)
T3 Uptake Ratio: 26 % (ref 24–39)
T4, Total: 6.9 ug/dL (ref 4.5–12.0)
TSH: 1.19 u[IU]/mL (ref 0.450–4.500)

## 2019-03-02 LAB — HEMOGLOBIN A1C
Est. average glucose Bld gHb Est-mCnc: 108 mg/dL
Hgb A1c MFr Bld: 5.4 % (ref 4.8–5.6)

## 2019-03-02 LAB — PROLACTIN: Prolactin: 17.5 ng/mL (ref 4.8–23.3)

## 2019-03-02 LAB — PROGESTERONE: Progesterone: 0.1 ng/mL

## 2019-03-29 ENCOUNTER — Other Ambulatory Visit: Payer: Self-pay

## 2019-03-29 ENCOUNTER — Ambulatory Visit: Payer: Medicaid Other | Attending: Family Medicine | Admitting: Family Medicine

## 2019-03-29 ENCOUNTER — Encounter: Payer: Self-pay | Admitting: Family Medicine

## 2019-03-29 VITALS — BP 108/68 | HR 74 | Temp 98.0°F | Ht 60.0 in | Wt 232.0 lb

## 2019-03-29 DIAGNOSIS — Z809 Family history of malignant neoplasm, unspecified: Secondary | ICD-10-CM | POA: Insufficient documentation

## 2019-03-29 DIAGNOSIS — Z806 Family history of leukemia: Secondary | ICD-10-CM | POA: Insufficient documentation

## 2019-03-29 DIAGNOSIS — J45909 Unspecified asthma, uncomplicated: Secondary | ICD-10-CM | POA: Insufficient documentation

## 2019-03-29 DIAGNOSIS — Z91018 Allergy to other foods: Secondary | ICD-10-CM | POA: Diagnosis not present

## 2019-03-29 DIAGNOSIS — G5603 Carpal tunnel syndrome, bilateral upper limbs: Secondary | ICD-10-CM | POA: Diagnosis not present

## 2019-03-29 DIAGNOSIS — R061 Stridor: Secondary | ICD-10-CM | POA: Insufficient documentation

## 2019-03-29 DIAGNOSIS — Z825 Family history of asthma and other chronic lower respiratory diseases: Secondary | ICD-10-CM | POA: Insufficient documentation

## 2019-03-29 DIAGNOSIS — Z801 Family history of malignant neoplasm of trachea, bronchus and lung: Secondary | ICD-10-CM | POA: Diagnosis not present

## 2019-03-29 DIAGNOSIS — R601 Generalized edema: Secondary | ICD-10-CM | POA: Diagnosis not present

## 2019-03-29 NOTE — Progress Notes (Signed)
Patient states that she has fluid in her body that cause her pain,.

## 2019-03-29 NOTE — Progress Notes (Signed)
Subjective:  Patient ID: Natalie Beck, female    DOB: 14-Oct-1990  Age: 28 y.o. MRN: [id number]  CC: New Patient (Initial Visit)   HPI [name] is a 28 year old female who presents today to establish care. She is currently lactating and was under the care of a lactation specialist until recently. She noticed fluid in her arms in 07/2018  and she was able to move it up her arm. She has noticed 'milk'  all over her body in her thighs, groin back and feels a shift when she is nursing her one and a [age] year old.  When she used a breast pump she does not feel the fluid drained from all over her body. Feels she has fluid behind her ears, scalp, face and tries to move the fluid around. Since having her baby she has gained 12 lbs; during pregnancy she gained about 20 pounds then lost it after her baby was born and subsequently regained some. There have been instances where she has been short of breath due to this and on one occasion happened while she was driving and she is frustrated about this feeling as it is associated with pain and discomfort, she sometimes does not want to get out of bed due to anxiety and depression from this; she has seen several physicians all to no avail. Denies presence of dyspnea at this time.  Her last well woman visit was last month.  Prolactin levels were initially elevated but have now normalized to 17.5.  She had seen an endocrinologist for this with no new recommendations. She complains of numbness and tingling in her hands when doing her daughter's hair  Past Medical History:  Diagnosis Date  . Anemia   . Asthma     Past Surgical History:  Procedure Laterality Date  . CESAREAN SECTION N/A 08/17/2017   Procedure: CESAREAN SECTION;  Surgeon: Harlin Heys, MD;  Location: [facility] ORS;  Service: Obstetrics;  Laterality: N/A;  . dental implant    . WISDOM TOOTH EXTRACTION      Family History  Problem Relation Age of Onset  . Asthma Father   .  Asthma Sister   . Hypertension Sister   . Rheum arthritis Paternal Grandmother   . Cancer Paternal Grandmother        lung  . Cirrhosis Maternal Grandmother   . Cancer Other        maternal great grandmother-leukemia  . Cancer Paternal Grandfather   . Breast cancer Neg Hx   . Ovarian cancer Neg Hx     Allergies  Allergen Reactions  . Citrus Dermatitis    Hypoallergenic dermatitis-tongue swells and itches    Outpatient Medications Prior to Visit  Medication Sig Dispense Refill  . albuterol (PROVENTIL HFA;VENTOLIN HFA) 108 (90 Base) MCG/ACT inhaler Inhale into the lungs every 6 (six) hours as needed for wheezing or shortness of breath.    Marland Kitchen ibuprofen (ADVIL) 800 MG tablet Take 800 mg by mouth as needed.    Marland Kitchen ketorolac (TORADOL) 10 MG tablet Take 1 tablet (10 mg total) by mouth every 6 (six) hours as needed. (Patient not taking: Reported on 03/29/2019) 20 tablet 0  . loratadine-pseudoephedrine (CLARITIN-D 24 HOUR) 10-240 MG 24 hr tablet Take 1 tablet by mouth daily. (Patient not taking: Reported on 03/29/2019) 30 tablet 0  . LORazepam (ATIVAN) 0.5 MG tablet Take 1 tablet (0.5 mg total) by mouth every 8 (eight) hours as needed for anxiety or sleep. (Patient not taking: Reported  on 03/29/2019) 20 tablet 0  . Prenatal Vit-Fe Fumarate-FA (PRENATAL VITAMINS PLUS PO) Take by mouth.    . Vitamin D, Ergocalciferol, (DRISDOL) 1.25 MG (50000 UT) CAPS capsule Take 1 capsule (50,000 Units total) by mouth every 7 (seven) days. (Patient not taking: Reported on 03/29/2019) 12 capsule 0   No facility-administered medications prior to visit.      ROS Review of Systems  Constitutional: Negative for activity change, appetite change and fatigue.  HENT: Negative for congestion, sinus pressure and sore throat.   Eyes: Negative for visual disturbance.  Respiratory: Negative for cough, chest tightness, shortness of breath and wheezing.   Cardiovascular: Negative for chest pain and palpitations.   Gastrointestinal: Negative for abdominal distention, abdominal pain and constipation.  Endocrine: Negative for polydipsia.  Genitourinary: Negative for dysuria and frequency.  Musculoskeletal: Negative for arthralgias and back pain.  Skin: Negative for rash.  Neurological: Negative for tremors, light-headedness and numbness.  Hematological: Does not bruise/bleed easily.  Psychiatric/Behavioral: Negative for agitation and behavioral problems.    Objective:  BP 108/68   Pulse 74   Temp 98 F (36.7 C) (Oral)   Ht 5' (1.524 m)   Wt 232 lb (105.2 kg)   SpO2 98%   BMI 45.31 kg/m   BP/Weight 03/29/2019 02/22/2019 10/05/2018  Systolic BP 108 82 77  Diastolic BP 68 57 58  Wt. (Lbs) 232 229.4 226.3  BMI 45.31 44.8 44.2  Some encounter information is confidential and restricted. Go to Review Flowsheets activity to see all data.      Physical Exam Constitutional:      Appearance: She is well-developed.  Neck:     Vascular: No JVD.     Comments: No JVD Cardiovascular:     Rate and Rhythm: Normal rate.     Heart sounds: Normal heart sounds. No murmur.  Pulmonary:     Effort: Pulmonary effort is normal.     Breath sounds: Normal breath sounds. No wheezing or rales.  Chest:     Chest wall: No tenderness.  Abdominal:     General: Bowel sounds are normal. There is no distension.     Palpations: Abdomen is soft. There is no mass.     Tenderness: There is no abdominal tenderness.  Musculoskeletal: Normal range of motion.     Right lower leg: No edema.     Left lower leg: No edema.  Neurological:     Mental Status: She is alert and oriented to person, place, and time.  Psychiatric:        Mood and Affect: Mood normal.     CMP Latest Ref Rng & Units 03/01/2019 10/05/2018  Glucose 65 - 99 mg/dL 86 86  BUN 6 - 20 mg/dL 11 10  Creatinine 1.610.57 - 1.00 mg/dL 0.960.77 0.450.80  Sodium 409134 - 144 mmol/L 139 142  Potassium 3.5 - 5.2 mmol/L 4.2 4.2  Chloride 96 - 106 mmol/L 104 106  CO2 20 - 29  mmol/L 23 22  Calcium 8.7 - 10.2 mg/dL 9.1 9.6  Total Protein 6.0 - 8.5 g/dL 6.5 6.7  Total Bilirubin 0.0 - 1.2 mg/dL 0.4 0.2  Alkaline Phos 39 - 117 IU/L 94 102  AST 0 - 40 IU/L 14 15  ALT 0 - 32 IU/L 13 13    Lipid Panel     Component Value Date/Time   CHOL 189 03/01/2019 1027   TRIG 120 03/01/2019 1027   HDL 42 03/01/2019 1027   CHOLHDL 4.5 (H) 03/01/2019 1027  LDLCALC 125 (H) 03/01/2019 1027    CBC    Component Value Date/Time   WBC 8.5 03/01/2019 1027   WBC 11.8 (H) 08/17/2017 0455   RBC 4.09 03/01/2019 1027   RBC 3.95 08/17/2017 0455   HGB 12.1 03/01/2019 1027   HCT 36.9 03/01/2019 1027   PLT 260 03/01/2019 1027   MCV 90 03/01/2019 1027   MCH 29.6 03/01/2019 1027   MCH 28.9 08/17/2017 0455   MCHC 32.8 03/01/2019 1027   MCHC 33.5 08/17/2017 0455   RDW 13.5 03/01/2019 1027   LYMPHSABS 3.2 (H) 01/13/2017 1629   EOSABS 0.1 01/13/2017 1629   BASOSABS 0.0 01/13/2017 1629    Lab Results  Component Value Date   HGBA1C 5.4 03/01/2019    Assessment & Plan:   1. Generalized edema No noticeable edema on my physical exam Areas on anterior abdominal wall which the patient points to as edematous reveal presence of adipose tissue; left underarm which she points to reveals presence of stridor We will send of BNP as a starting point Also offered to place her on Lasix but at this time given she is nursing we will hold off and she will be in touch with the clinic when she is able to wean her baby so we can write a prescription for Lasix Discussed the option for therapy given this is distressing to her but she declines at this time Advised I will perform some research into what referral options available at this time I am unsure if referral to breast surgeon will be beneficial. - Brain natriuretic peptide  2. Bilateral carpal tunnel syndrome This could be from weight gain Advised to use wrist splints Ongoing lactation precludes use of medications      Hoy Register, MD, FAAFP. [facility] [place], [place] [phone]   03/29/2019, 11:19 AM

## 2019-03-29 NOTE — Patient Instructions (Signed)
Edema  Edema is when you have too much fluid in your body or under your skin. Edema may make your legs, feet, and ankles swell up. Swelling is also common in looser tissues, like around your eyes. This is a common condition. It gets more common as you get older. There are many possible causes of edema. Eating too much salt (sodium) and being on your feet or sitting for a long time can cause edema in your legs, feet, and ankles. Hot weather may make edema worse. Edema is usually painless. Your skin may look swollen or shiny. Follow these instructions at home:  Keep the swollen body part raised (elevated) above the level of your heart when you are sitting or lying down.  Do not sit still or stand for a long time.  Do not wear tight clothes. Do not wear garters on your upper legs.  Exercise your legs. This can help the swelling go down.  Wear elastic bandages or support stockings as told by your doctor.  Eat a low-salt (low-sodium) diet to reduce fluid as told by your doctor.  Depending on the cause of your swelling, you may need to limit how much fluid you drink (fluid restriction).  Take over-the-counter and prescription medicines only as told by your doctor. Contact a doctor if:  Treatment is not working.  You have heart, liver, or kidney disease and have symptoms of edema.  You have sudden and unexplained weight gain. Get help right away if:  You have shortness of breath or chest pain.  You cannot breathe when you lie down.  You have pain, redness, or warmth in the swollen areas.  You have heart, liver, or kidney disease and get edema all of a sudden.  You have a fever and your symptoms get worse all of a sudden. Summary  Edema is when you have too much fluid in your body or under your skin.  Edema may make your legs, feet, and ankles swell up. Swelling is also common in looser tissues, like around your eyes.  Raise (elevate) the swollen body part above the level of your  heart when you are sitting or lying down.  Follow your doctor's instructions about diet and how much fluid you can drink (fluid restriction). This information is not intended to replace advice given to you by your health care provider. Make sure you discuss any questions you have with your health care provider. Document Released: 09/25/2007 Document Revised: 04/11/2017 Document Reviewed: 04/26/2016 Elsevier Patient Education  2020 Elsevier Inc.  

## 2019-03-30 LAB — BRAIN NATRIURETIC PEPTIDE: BNP: 10.6 pg/mL (ref 0.0–100.0)

## 2019-05-17 ENCOUNTER — Ambulatory Visit: Payer: Medicaid Other | Admitting: Family Medicine

## 2019-05-24 ENCOUNTER — Emergency Department (HOSPITAL_COMMUNITY)
Admission: EM | Admit: 2019-05-24 | Discharge: 2019-05-24 | Disposition: A | Discharge status: Home / Self Care | Payer: Medicaid Other | Attending: Emergency Medicine | Admitting: Emergency Medicine

## 2019-05-24 ENCOUNTER — Encounter (HOSPITAL_COMMUNITY): Payer: Self-pay

## 2019-05-24 ENCOUNTER — Other Ambulatory Visit: Payer: Self-pay

## 2019-05-24 DIAGNOSIS — Z3A01 Less than 8 weeks gestation of pregnancy: Secondary | ICD-10-CM

## 2019-05-24 DIAGNOSIS — Z87891 Personal history of nicotine dependence: Secondary | ICD-10-CM | POA: Diagnosis not present

## 2019-05-24 DIAGNOSIS — J45909 Unspecified asthma, uncomplicated: Secondary | ICD-10-CM | POA: Diagnosis not present

## 2019-05-24 DIAGNOSIS — R229 Localized swelling, mass and lump, unspecified: Secondary | ICD-10-CM | POA: Insufficient documentation

## 2019-05-24 DIAGNOSIS — M79601 Pain in right arm: Secondary | ICD-10-CM | POA: Diagnosis not present

## 2019-05-24 DIAGNOSIS — M79602 Pain in left arm: Secondary | ICD-10-CM | POA: Diagnosis not present

## 2019-05-24 DIAGNOSIS — Z331 Pregnant state, incidental: Secondary | ICD-10-CM | POA: Diagnosis not present

## 2019-05-24 DIAGNOSIS — M542 Cervicalgia: Secondary | ICD-10-CM | POA: Diagnosis not present

## 2019-05-24 DIAGNOSIS — O99891 Other specified diseases and conditions complicating pregnancy: Secondary | ICD-10-CM | POA: Diagnosis not present

## 2019-05-24 LAB — CBC WITH DIFFERENTIAL/PLATELET
Abs Immature Granulocytes: 0.02 10*3/uL (ref 0.00–0.07)
Basophils Absolute: 0.1 10*3/uL (ref 0.0–0.1)
Basophils Relative: 1 %
Eosinophils Absolute: 0.3 10*3/uL (ref 0.0–0.5)
Eosinophils Relative: 3 %
HCT: 38.2 % (ref 36.0–46.0)
Hemoglobin: 11.9 g/dL — ABNORMAL LOW (ref 12.0–15.0)
Immature Granulocytes: 0 %
Lymphocytes Relative: 37 %
Lymphs Abs: 4.1 10*3/uL — ABNORMAL HIGH (ref 0.7–4.0)
MCH: 28.3 pg (ref 26.0–34.0)
MCHC: 31.2 g/dL (ref 30.0–36.0)
MCV: 91 fL (ref 80.0–100.0)
Monocytes Absolute: 0.8 10*3/uL (ref 0.1–1.0)
Monocytes Relative: 8 %
Neutro Abs: 5.8 10*3/uL (ref 1.7–7.7)
Neutrophils Relative %: 51 %
Platelets: 280 10*3/uL (ref 150–400)
RBC: 4.2 MIL/uL (ref 3.87–5.11)
RDW: 15 % (ref 11.5–15.5)
WBC: 11.1 10*3/uL — ABNORMAL HIGH (ref 4.0–10.5)
nRBC: 0 % (ref 0.0–0.2)

## 2019-05-24 LAB — BASIC METABOLIC PANEL
Anion gap: 8 (ref 5–15)
BUN: 8 mg/dL (ref 6–20)
CO2: 21 mmol/L — ABNORMAL LOW (ref 22–32)
Calcium: 8.8 mg/dL — ABNORMAL LOW (ref 8.9–10.3)
Chloride: 107 mmol/L (ref 98–111)
Creatinine, Ser: 0.76 mg/dL (ref 0.44–1.00)
GFR calc Af Amer: >60 mL/min (ref >60)
GFR calc non Af Amer: >60 mL/min (ref >60)
Glucose, Bld: 102 mg/dL — ABNORMAL HIGH (ref 70–99)
Potassium: 4 mmol/L (ref 3.5–5.1)
Sodium: 136 mmol/L (ref 135–145)

## 2019-05-24 LAB — POC URINE PREG, ED: Preg Test, Ur: POSITIVE — AB

## 2019-05-24 NOTE — ED Provider Notes (Signed)
[facility] DEPT Provider Note   CSN: [id number] Arrival date & time: 05/24/19  1715     History Chief Complaint  Patient presents with  . lumps    Natalie Beck is a 29 y.o. female.  HPI   28yF with sensation of "pockets of fluid" in various parts of her body. Arms, neck, face, scalp, neck, groin, legs, etc. She says she first noticed this shortly after the birth of her daughter last year.  Persistent symptoms since. Has spoke with a lactation consultant and told that symptoms may be related to dysregulation of "my milk line." Has also seen her GYN, and endocrinologist and PCP. She expresses frustration that she has not been provided with a definitive diagnosis. Symptoms come and go. No appreciable exacerbating or relieving factors. No rash.  Past Medical History:  Diagnosis Date  . Anemia   . Asthma     Patient Active Problem List   Diagnosis Date Noted  . Elevated prolactin level 11/05/2018  . History of low transverse cesarean section 09/25/2017  . OBESITY, NOS 06/19/2006  . RHINITIS, ALLERGIC 06/19/2006  . ASTHMA, EXERCISE INDUCED 06/19/2006    Past Surgical History:  Procedure Laterality Date  . CESAREAN SECTION N/A 08/17/2017   Procedure: CESAREAN SECTION;  Surgeon: Harlin Heys, MD;  Location: [facility] ORS;  Service: Obstetrics;  Laterality: N/A;  . dental implant    . WISDOM TOOTH EXTRACTION       OB History    Gravida  1   Para  1   Term  1   Preterm      AB      Living  1     SAB      TAB      Ectopic      Multiple  0   Live Births  1           Family History  Problem Relation Age of Onset  . Asthma Father   . Asthma Sister   . Hypertension Sister   . Rheum arthritis Paternal Grandmother   . Cancer Paternal Grandmother        lung  . Cirrhosis Maternal Grandmother   . Cancer Other        maternal great grandmother-leukemia  . Cancer Paternal Grandfather   . Breast cancer Neg Hx   .  Ovarian cancer Neg Hx     Social History   Tobacco Use  . Smoking status: Former Research scientist (life sciences)  . Smokeless tobacco: Never Used  Substance Use Topics  . Alcohol use: Yes    Alcohol/week: 2.0 standard drinks    Types: 2 Standard drinks or equivalent per week  . Drug use: No    Home Medications Prior to Admission medications   Medication Sig Start Date End Date Taking? Authorizing Provider  albuterol (PROVENTIL HFA;VENTOLIN HFA) 108 (90 Base) MCG/ACT inhaler Inhale into the lungs every 6 (six) hours as needed for wheezing or shortness of breath.   Yes [provider]  ibuprofen (ADVIL) 800 MG tablet Take 800 mg by mouth as needed. 11/05/18  Yes [provider]  OVER THE COUNTER MEDICATION Take 1 tablet by mouth 3 (three) times daily. Sunflower Lithosyine   Yes [provider]  ketorolac (TORADOL) 10 MG tablet Take 1 tablet (10 mg total) by mouth every 6 (six) hours as needed. Patient not taking: Reported on 03/29/2019 10/02/18   Diona Fanti, CNM  loratadine-pseudoephedrine (CLARITIN-D 24 HOUR) 10-240 MG 24 hr  tablet Take 1 tablet by mouth daily. Patient not taking: Reported on 03/29/2019 10/05/18   Gunnar Bulla, CNM  LORazepam (ATIVAN) 0.5 MG tablet Take 1 tablet (0.5 mg total) by mouth every 8 (eight) hours as needed for anxiety or sleep. Patient not taking: Reported on 03/29/2019 10/02/18   Gunnar Bulla, CNM  Vitamin D, Ergocalciferol, (DRISDOL) 1.25 MG (50000 UT) CAPS capsule Take 1 capsule (50,000 Units total) by mouth every 7 (seven) days. Patient not taking: Reported on 03/29/2019 10/08/18   Gunnar Bulla, CNM    Allergies    Citrus and Other  Review of Systems   Review of Systems All systems reviewed and negative, other than as noted in HPI.  Physical Exam Updated Vital Signs BP 113/61 (BP Location: Right Arm)   Pulse 70   Temp 98.5 F (36.9 C) (Oral)   Resp 18   Ht 5' (1.524 m)   Wt 104.3 kg   LMP  04/22/2019   SpO2 100%   BMI 44.92 kg/m   Physical Exam Vitals and nursing note reviewed.  Constitutional:      General: She is not in acute distress.    Appearance: She is well-developed.  HENT:     Head: Normocephalic and atraumatic.  Eyes:     General:        Right eye: No discharge.        Left eye: No discharge.     Conjunctiva/sclera: Conjunctivae normal.  Cardiovascular:     Rate and Rhythm: Normal rate and regular rhythm.     Heart sounds: Normal heart sounds. No murmur. No friction rub. No gallop.   Pulmonary:     Effort: Pulmonary effort is normal. No respiratory distress.     Breath sounds: Normal breath sounds.  Abdominal:     General: There is no distension.     Palpations: Abdomen is soft.     Tenderness: There is no abdominal tenderness.  Musculoskeletal:        General: No tenderness.     Cervical back: Neck supple.  Skin:    General: Skin is warm and dry.  Neurological:     Mental Status: She is alert.  Psychiatric:        Behavior: Behavior normal.        Thought Content: Thought content normal.     ED Results / Procedures / Treatments   Labs (all labs ordered are listed, but only abnormal results are displayed) Labs Reviewed  CBC WITH DIFFERENTIAL/PLATELET - Abnormal; Notable for the following components:      Result Value   WBC 11.1 (*)    Hemoglobin 11.9 (*)    Lymphs Abs 4.1 (*)    All other components within normal limits  BASIC METABOLIC PANEL - Abnormal; Notable for the following components:   CO2 21 (*)    Glucose, Bld 102 (*)    Calcium 8.8 (*)    All other components within normal limits  POC URINE PREG, ED - Abnormal; Notable for the following components:   Preg Test, Ur POSITIVE (*)    All other components within normal limits    EKG None  Radiology No results found.  Procedures Procedures (including critical care time)  Medications Ordered in ED Medications - No data to display  ED Course  I have reviewed the  triage vital signs and the nursing notes.  Pertinent labs & imaging results that were available during my care of the patient were reviewed by  me and considered in my medical decision making (see chart for details).    MDM Rules/Calculators/A&P                     28yF with intermittent sensation of fullness/fluid/discomfort in numerous areas of her body. I do not appreciate any adenopathy although body habitus does limit exam to some degree. Basic labs unremarkable. I suspect there is a large anxiety component or even some depression. Pt mentioned several times significant stress related to care of her 29 year old. She feels safe though. I tried to reassure her. I do not have a clear explanation for her symptoms but I do not have a significant suspicion for an emergent process. She was informed of positive pregnancy test. Outpt FU.   Final Clinical Impression(s) / ED Diagnoses Final diagnoses:  Less than [redacted] weeks gestation of pregnancy    Rx / DC Orders ED Discharge Orders    None       Raeford Razor, MD 05/26/19 1512

## 2019-05-24 NOTE — ED Notes (Signed)
Koh[place], MD at bedside. 

## 2019-05-24 NOTE — Discharge Instructions (Signed)
Follow up with your OB.

## 2019-05-24 NOTE — ED Triage Notes (Signed)
Patient states she feels pockets of fluids in her groins, vaginal area, face, neck, axillary , and back since April 2020. Patient states these pocket s of fluid are causing her discomfort.

## 2019-05-24 NOTE — ED Notes (Signed)
Juleen China, MD made aware of POC urine pregnancy result.

## 2019-06-07 NOTE — Patient Instructions (Signed)
First Trimester of Pregnancy The first trimester of pregnancy is from week 1 until the end of week 13 (months 1 through 3). A week after a sperm fertilizes an egg, the egg will implant on the wall of the uterus. This embryo will begin to develop into a baby. Genes from you and your partner will form the baby. The female genes will determine whether the baby will be a boy or a girl. At 6-8 weeks, the eyes and face will be formed, and the heartbeat can be seen on ultrasound. At the end of 12 weeks, all the baby's organs will be formed. Now that you are pregnant, you will want to do everything you can to have a healthy baby. Two of the most important things are to get good prenatal care and to follow your health care provider's instructions. Prenatal care is all the medical care you receive before the baby's birth. This care will help prevent, find, and treat any problems during the pregnancy and childbirth. Body changes during your first trimester Your body goes through many changes during pregnancy. The changes vary from woman to woman.  You may gain or lose a couple of pounds at first.  You may feel sick to your stomach (nauseous) and you may throw up (vomit). If the vomiting is uncontrollable, call your health care provider.  You may tire easily.  You may develop headaches that can be relieved by medicines. All medicines should be approved by your health care provider.  You may urinate more often. Painful urination may mean you have a bladder infection.  You may develop heartburn as a result of your pregnancy.  You may develop constipation because certain hormones are causing the muscles that push stool through your intestines to slow down.  You may develop hemorrhoids or swollen veins (varicose veins).  Your breasts may begin to grow larger and become tender. Your nipples may stick out more, and the tissue that surrounds them (areola) may become darker.  Your gums may bleed and may be  sensitive to brushing and flossing.  Dark spots or blotches (chloasma, mask of pregnancy) may develop on your face. This will likely fade after the baby is born.  Your menstrual periods will stop.  You may have a loss of appetite.  You may develop cravings for certain kinds of food.  You may have changes in your emotions from day to day, such as being excited to be pregnant or being concerned that something may go wrong with the pregnancy and baby.  You may have more vivid and strange dreams.  You may have changes in your hair. These can include thickening of your hair, rapid growth, and changes in texture. Some women also have hair loss during or after pregnancy, or hair that feels dry or thin. Your hair will most likely return to normal after your baby is born. What to expect at prenatal visits During a routine prenatal visit:  You will be weighed to make sure you and the baby are growing normally.  Your blood pressure will be taken.  Your abdomen will be measured to track your baby's growth.  The fetal heartbeat will be listened to between weeks 10 and 14 of your pregnancy.  Test results from any previous visits will be discussed. Your health care provider may ask you:  How you are feeling.  If you are feeling the baby move.  If you have had any abnormal symptoms, such as leaking fluid, bleeding, severe headaches, or abdominal   cramping.  If you are using any tobacco products, including cigarettes, chewing tobacco, and electronic cigarettes.  If you have any questions. Other tests that may be performed during your first trimester include:  Blood tests to find your blood type and to check for the presence of any previous infections. The tests will also be used to check for low iron levels (anemia) and protein on red blood cells (Rh antibodies). Depending on your risk factors, or if you previously had diabetes during pregnancy, you may have tests to check for high blood sugar  that affects pregnant women (gestational diabetes).  Urine tests to check for infections, diabetes, or protein in the urine.  An ultrasound to confirm the proper growth and development of the baby.  Fetal screens for spinal cord problems (spina bifida) and Down syndrome.  HIV (human immunodeficiency virus) testing. Routine prenatal testing includes screening for HIV, unless you choose not to have this test.  You may need other tests to make sure you and the baby are doing well. Follow these instructions at home: Medicines  Follow your health care provider's instructions regarding medicine use. Specific medicines may be either safe or unsafe to take during pregnancy.  Take a prenatal vitamin that contains at least 600 micrograms (mcg) of folic acid.  If you develop constipation, try taking a stool softener if your health care provider approves. Eating and drinking   Eat a balanced diet that includes fresh fruits and vegetables, whole grains, good sources of protein such as meat, eggs, or tofu, and low-fat dairy. Your health care provider will help you determine the amount of weight gain that is right for you.  Avoid raw meat and uncooked cheese. These carry germs that can cause birth defects in the baby.  Eating four or five small meals rather than three large meals a day may help relieve nausea and vomiting. If you start to feel nauseous, eating a few soda crackers can be helpful. Drinking liquids between meals, instead of during meals, also seems to help ease nausea and vomiting.  Limit foods that are high in fat and processed sugars, such as fried and sweet foods.  To prevent constipation: ? Eat foods that are high in fiber, such as fresh fruits and vegetables, whole grains, and beans. ? Drink enough fluid to keep your urine clear or pale yellow. Activity  Exercise only as directed by your health care provider. Most women can continue their usual exercise routine during  pregnancy. Try to exercise for 30 minutes at least 5 days a week. Exercising will help you: ? Control your weight. ? Stay in shape. ? Be prepared for labor and delivery.  Experiencing pain or cramping in the lower abdomen or lower back is a good sign that you should stop exercising. Check with your health care provider before continuing with normal exercises.  Try to avoid standing for long periods of time. Move your legs often if you must stand in one place for a long time.  Avoid heavy lifting.  Wear low-heeled shoes and practice good posture.  You may continue to have sex unless your health care provider tells you not to. Relieving pain and discomfort  Wear a good support bra to relieve breast tenderness.  Take warm sitz baths to soothe any pain or discomfort caused by hemorrhoids. Use hemorrhoid cream if your health care provider approves.  Rest with your legs elevated if you have leg cramps or low back pain.  If you develop varicose veins in   your legs, wear support hose. Elevate your feet for 15 minutes, 3-4 times a day. Limit salt in your diet. Prenatal care  Schedule your prenatal visits by the twelfth week of pregnancy. They are usually scheduled monthly at first, then more often in the last 2 months before delivery.  Write down your questions. Take them to your prenatal visits.  Keep all your prenatal visits as told by your health care provider. This is important. Safety  Wear your seat belt at all times when driving.  Make a list of emergency phone numbers, including numbers for family, friends, the hospital, and police and fire departments. General instructions  Ask your health care provider for a referral to a local prenatal education class. Begin classes no later than the beginning of month 6 of your pregnancy.  Ask for help if you have counseling or nutritional needs during pregnancy. Your health care provider can offer advice or refer you to specialists for help  with various needs.  Do not use hot tubs, steam rooms, or saunas.  Do not douche or use tampons or scented sanitary pads.  Do not cross your legs for long periods of time.  Avoid cat litter boxes and soil used by cats. These carry germs that can cause birth defects in the baby and possibly loss of the fetus by miscarriage or stillbirth.  Avoid all smoking, herbs, alcohol, and medicines not prescribed by your health care provider. Chemicals in these products affect the formation and growth of the baby.  Do not use any products that contain nicotine or tobacco, such as cigarettes and e-cigarettes. If you need help quitting, ask your health care provider. You may receive counseling support and other resources to help you quit.  Schedule a dentist appointment. At home, brush your teeth with a soft toothbrush and be gentle when you floss. Contact a health care provider if:  You have dizziness.  You have mild pelvic cramps, pelvic pressure, or nagging pain in the abdominal area.  You have persistent nausea, vomiting, or diarrhea.  You have a bad smelling vaginal discharge.  You have pain when you urinate.  You notice increased swelling in your face, hands, legs, or ankles.  You are exposed to fifth disease or chickenpox.  You are exposed to German measles (rubella) and have never had it. Get help right away if:  You have a fever.  You are leaking fluid from your vagina.  You have spotting or bleeding from your vagina.  You have severe abdominal cramping or pain.  You have rapid weight gain or loss.  You vomit blood or material that looks like coffee grounds.  You develop a severe headache.  You have shortness of breath.  You have any kind of trauma, such as from a fall or a car accident. Summary  The first trimester of pregnancy is from week 1 until the end of week 13 (months 1 through 3).  Your body goes through many changes during pregnancy. The changes vary from  woman to woman.  You will have routine prenatal visits. During those visits, your health care provider will examine you, discuss any test results you may have, and talk with you about how you are feeling. This information is not intended to replace advice given to you by your health care provider. Make sure you discuss any questions you have with your health care provider. Document Revised: 03/21/2017 Document Reviewed: 03/20/2016 Elsevier Patient Education  2020 [employer]  Commonly Asked Questions During Pregnancy    Cats: A parasite can be excreted in cat feces.  To avoid exposure you need to have another person empty the little box.  If you must empty the litter box you will need to wear gloves.  Wash your hands after handling your cat.  This parasite can also be found in raw or undercooked meat so this should also be avoided.  Colds, Sore Throats, Flu: Please check your medication sheet to see what you can take for symptoms.  If your symptoms are unrelieved by these medications please call the office.  Dental Work: Most any dental work your dentist recommends is permitted.  X-rays should only be taken during the first trimester if absolutely necessary.  Your abdomen should be shielded with a lead apron during all x-rays.  Please notify your provider prior to receiving any x-rays.  Novocaine is fine; gas is not recommended.  If your dentist requires a note from us prior to dental work please call the office and we will provide one for you.  Exercise: Exercise is an important part of staying healthy during your pregnancy.  You may continue most exercises you were accustomed to prior to pregnancy.  Later in your pregnancy you will most likely notice you have difficulty with activities requiring balance like riding a bicycle.  It is important that you listen to your body and avoid activities that put you at a higher risk of falling.  Adequate rest and staying well hydrated are a must!  If you have  questions about the safety of specific activities ask your provider.    Exposure to Children with illness: Try to avoid obvious exposure; report any symptoms to us when noted,  If you have chicken pos, red measles or mumps, you should be immune to these diseases.   Please do not take any vaccines while pregnant unless you have checked with your OB provider.  Fetal Movement: After 28 weeks we recommend you do "kick counts" twice daily.  Lie or sit down in a calm quiet environment and count your baby movements "kicks".  You should feel your baby at least 10 times per hour.  If you have not felt 10 kicks within the first hour get up, walk around and have something sweet to eat or drink then repeat for an additional hour.  If count remains less than 10 per hour notify your provider.  Fumigating: Follow your pest control agent's advice as to how long to stay out of your home.  Ventilate the area well before re-entering.  Hemorrhoids:   Most over-the-counter preparations can be used during pregnancy.  Check your medication to see what is safe to use.  It is important to use a stool softener or fiber in your diet and to drink lots of liquids.  If hemorrhoids seem to be getting worse please call the office.   Hot Tubs:  Hot tubs Jacuzzis and saunas are not recommended while pregnant.  These increase your internal body temperature and should be avoided.  Intercourse:  Sexual intercourse is safe during pregnancy as long as you are comfortable, unless otherwise advised by your provider.  Spotting may occur after intercourse; report any bright red bleeding that is heavier than spotting.  Labor:  If you know that you are in labor, please go to the hospital.  If you are unsure, please call the office and let us help you decide what to do.  Lifting, straining, etc:  If your job requires heavy lifting or straining please check with   your provider for any limitations.  Generally, you should not lift items heavier than  that you can lift simply with your hands and arms (no back muscles)  Painting:  Paint fumes do not harm your pregnancy, but may make you ill and should be avoided if possible.  Latex or water based paints have less odor than oils.  Use adequate ventilation while painting.  Permanents & Hair Color:  Chemicals in hair dyes are not recommended as they cause increase hair dryness which can increase hair loss during pregnancy.  " Highlighting" and permanents are allowed.  Dye may be absorbed differently and permanents may not hold as well during pregnancy.  Sunbathing:  Use a sunscreen, as skin burns easily during pregnancy.  Drink plenty of fluids; avoid over heating.  Tanning Beds:  Because their possible side effects are still unknown, tanning beds are not recommended.  Ultrasound Scans:  Routine ultrasounds are performed at approximately 20 weeks.  You will be able to see your baby's general anatomy an if you would like to know the gender this can usually be determined as well.  If it is questionable when you conceived you may also receive an ultrasound early in your pregnancy for dating purposes.  Otherwise ultrasound exams are not routinely performed unless there is a medical necessity.  Although you can request a scan we ask that you pay for it when conducted because insurance does not cover " patient request" scans.  Work: If your pregnancy proceeds without complications you may work until your due date, unless your physician or employer advises otherwise.  Round Ligament Pain/Pelvic Discomfort:  Sharp, shooting pains not associated with bleeding are fairly common, usually occurring in the second trimester of pregnancy.  They tend to be worse when standing up or when you remain standing for long periods of time.  These are the result of pressure of certain pelvic ligaments called "round ligaments".  Rest, Tylenol and heat seem to be the most effective relief.  As the womb and fetus grow, they rise  out of the pelvis and the discomfort improves.  Please notify the office if your pain seems different than that described.  It may represent a more serious condition.   How a Baby Grows During Pregnancy  Pregnancy begins when a female's sperm enters a female's egg (fertilization). Fertilization usually happens in one of the tubes (fallopian tubes) that connect the ovaries to the womb (uterus). The fertilized egg moves down the fallopian tube to the uterus. Once it reaches the uterus, it implants into the lining of the uterus and begins to grow. For the first 10 weeks, the fertilized egg is called an embryo. After 10 weeks, it is called a fetus. As the fetus continues to grow, it receives oxygen and nutrients through tissue (placenta) that grows to support the developing baby. The placenta is the life support system for the baby. It provides oxygen and nutrition and removes waste. Learning as much as you can about your pregnancy and how your baby is developing can help you enjoy the experience. It can also make you aware of when there might be a problem and when to ask questions. How long does a typical pregnancy last? A pregnancy usually lasts 280 days, or about 40 weeks. Pregnancy is divided into three periods of growth, also called trimesters:  First trimester: 0-12 weeks.  Second trimester: 13-27 weeks.  Third trimester: 28-40 weeks. The day when your baby is ready to be born (full term) is   your estimated date of delivery. How does my baby develop month by month? First month  The fertilized egg attaches to the inside of the uterus.  Some cells will form the placenta. Others will form the fetus.  The arms, legs, brain, spinal cord, lungs, and heart begin to develop.  At the end of the first month, the heart begins to beat. Second month  The bones, inner ear, eyelids, hands, and feet form.  The genitals develop.  By the end of 8 weeks, all major organs are developing. Third  month  All of the internal organs are forming.  Teeth develop below the gums.  Bones and muscles begin to grow. The spine can flex.  The skin is transparent.  Fingernails and toenails begin to form.  Arms and legs continue to grow longer, and hands and feet develop.  The fetus is about 3 inches (7.6 cm) long. Fourth month  The placenta is completely formed.  The external sex organs, neck, outer ear, eyebrows, eyelids, and fingernails are formed.  The fetus can hear, swallow, and move its arms and legs.  The kidneys begin to produce urine.  The skin is covered with a white, waxy coating (vernix) and very fine hair (lanugo). Fifth month  The fetus moves around more and can be felt for the first time (quickening).  The fetus starts to sleep and wake up and may begin to suck its finger.  The nails grow to the end of the fingers.  The organ in the digestive system that makes bile (gallbladder) functions and helps to digest nutrients.  If your baby is a girl, eggs are present in her ovaries. If your baby is a boy, testicles start to move down into his scrotum. Sixth month  The lungs are formed.  The eyes open. The brain continues to develop.  Your baby has fingerprints and toe prints. Your baby's hair grows thicker.  At the end of the second trimester, the fetus is about 9 inches (22.9 cm) long. Seventh month  The fetus kicks and stretches.  The eyes are developed enough to sense changes in light.  The hands can make a grasping motion.  The fetus responds to sound. Eighth month  All organs and body systems are fully developed and functioning.  Bones harden, and taste buds develop. The fetus may hiccup.  Certain areas of the brain are still developing. The skull remains soft. Ninth month  The fetus gains about  lb (0.23 kg) each week.  The lungs are fully developed.  Patterns of sleep develop.  The fetus's head typically moves into a head-down position  (vertex) in the uterus to prepare for birth.  The fetus weighs 6-9 lb (2.72-4.08 kg) and is 19-20 inches (48.26-50.8 cm) long. What can I do to have a healthy pregnancy and help my baby develop? General instructions  Take prenatal vitamins as directed by your health care provider. These include vitamins such as folic acid, iron, calcium, and vitamin D. They are important for healthy development.  Take medicines only as directed by your health care provider. Read labels and ask a pharmacist or your health care provider whether over-the-counter medicines, supplements, and prescription drugs are safe to take during pregnancy.  Keep all follow-up visits as directed by your health care provider. This is important. Follow-up visits include prenatal care and screening tests. How do I know if my baby is developing well? At each prenatal visit, your health care provider will do several different tests to   check on your health and keep track of your baby's development. These include:  Fundal height and position. ? Your health care provider will measure your growing belly from your pubic bone to the top of the uterus using a tape measure. ? Your health care provider will also feel your belly to determine your baby's position.  Heartbeat. ? An ultrasound in the first trimester can confirm pregnancy and show a heartbeat, depending on how far along you are. ? Your health care provider will check your baby's heart rate at every prenatal visit.  Second trimester ultrasound. ? This ultrasound checks your baby's development. It also may show your baby's gender. What should I do if I have concerns about my baby's development? Always talk with your health care provider about any concerns that you may have about your pregnancy and your baby. Summary  A pregnancy usually lasts 280 days, or about 40 weeks. Pregnancy is divided into three periods of growth, also called trimesters.  Your health care provider  will monitor your baby's growth and development throughout your pregnancy.  Follow your health care provider's recommendations about taking prenatal vitamins and medicines during your pregnancy.  Talk with your health care provider if you have any concerns about your pregnancy or your developing baby. This information is not intended to replace advice given to you by your health care provider. Make sure you discuss any questions you have with your health care provider. Document Revised: 07/30/2018 Document Reviewed: 02/19/2017 Elsevier Patient Education  2020 [employer].  

## 2019-06-08 ENCOUNTER — Other Ambulatory Visit: Payer: Self-pay

## 2019-06-08 ENCOUNTER — Other Ambulatory Visit: Payer: Self-pay | Admitting: Certified Nurse Midwife

## 2019-06-08 ENCOUNTER — Ambulatory Visit (INDEPENDENT_AMBULATORY_CARE_PROVIDER_SITE_OTHER): Payer: Medicaid Other

## 2019-06-08 ENCOUNTER — Ambulatory Visit (INDEPENDENT_AMBULATORY_CARE_PROVIDER_SITE_OTHER): Payer: Medicaid Other | Admitting: Certified Nurse Midwife

## 2019-06-08 VITALS — BP 101/52 | HR 69 | Ht 60.0 in | Wt 236.5 lb

## 2019-06-08 DIAGNOSIS — Z131 Encounter for screening for diabetes mellitus: Secondary | ICD-10-CM

## 2019-06-08 DIAGNOSIS — Z3491 Encounter for supervision of normal pregnancy, unspecified, first trimester: Secondary | ICD-10-CM | POA: Diagnosis not present

## 2019-06-08 DIAGNOSIS — Z789 Other specified health status: Secondary | ICD-10-CM

## 2019-06-08 DIAGNOSIS — Z0283 Encounter for blood-alcohol and blood-drug test: Secondary | ICD-10-CM

## 2019-06-08 DIAGNOSIS — Z1329 Encounter for screening for other suspected endocrine disorder: Secondary | ICD-10-CM

## 2019-06-08 DIAGNOSIS — Z113 Encounter for screening for infections with a predominantly sexual mode of transmission: Secondary | ICD-10-CM

## 2019-06-08 NOTE — Progress Notes (Signed)
Natalie Beck presents for NOB nurse interview visit.  Pregnancy confirmation done at [facility] ED.  G-2.  P-1.  LMP 04/15/2019.  EDD 01/27/2020. Dating scan done 06/08/2019 [facility] [redacted]w[redacted]d.  Pregnancy education material explained and given.  No cats in the home.  NOB labs ordered.  TSH/HgA1C ordered due to increased BMI.  Body mass index is 46.19 kg/m.  Sickle cell ordered. HIV and drug screen were explained and ordered.  PNV encouraged.  Genetic screening options discussed.  Genetic testing: Unsure.  FMLA form signed and financial policy reviewed.  RTC for NOB physical in 6 weeks with JML.  Patient wondering if she can take [employer], uses to help with lactation.  Currently trying to wean.

## 2019-06-09 LAB — GC/CHLAMYDIA PROBE AMP
Chlamydia trachomatis, NAA: NEGATIVE
Neisseria Gonorrhoeae by PCR: NEGATIVE

## 2019-06-09 LAB — URINALYSIS, ROUTINE W REFLEX MICROSCOPIC
Bilirubin, UA: NEGATIVE
Glucose, UA: NEGATIVE
Ketones, UA: NEGATIVE
Leukocytes,UA: NEGATIVE
Nitrite, UA: NEGATIVE
Protein,UA: NEGATIVE
RBC, UA: NEGATIVE
Specific Gravity, UA: 1.029 (ref 1.005–1.030)
Urobilinogen, Ur: 0.2 mg/dL (ref 0.2–1.0)
pH, UA: 6 (ref 5.0–7.5)

## 2019-06-09 LAB — VARICELLA ZOSTER ANTIBODY, IGG: Varicella zoster IgG: 968 index (ref >165)

## 2019-06-09 LAB — ABO AND RH: Rh Factor: POSITIVE

## 2019-06-09 LAB — HIV ANTIBODY (ROUTINE TESTING W REFLEX): HIV Screen 4th Generation wRfx: NONREACTIVE

## 2019-06-09 LAB — TSH: TSH: 2.18 u[IU]/mL (ref 0.450–4.500)

## 2019-06-09 LAB — SICKLE CELL SCREEN: Sickle Cell Screen: NEGATIVE

## 2019-06-09 LAB — RUBELLA SCREEN: Rubella Antibodies, IGG: 5.77 index (ref >0.99)

## 2019-06-09 LAB — ANTIBODY SCREEN: Antibody Screen: NEGATIVE

## 2019-06-09 LAB — RPR: RPR Ser Ql: NONREACTIVE

## 2019-06-09 LAB — HEMOGLOBIN A1C
Est. average glucose Bld gHb Est-mCnc: 111 mg/dL
Hgb A1c MFr Bld: 5.5 % (ref 4.8–5.6)

## 2019-06-09 LAB — HEPATITIS B SURFACE ANTIGEN: Hepatitis B Surface Ag: NEGATIVE

## 2019-06-09 NOTE — Progress Notes (Signed)
I have reviewed the record and concur with patient management and plan of care.    Gunnar Bulla, CNM [facility], [facility] 06/09/19 1:52 PM

## 2019-06-12 LAB — CULTURE, OB URINE

## 2019-06-12 LAB — URINE CULTURE, OB REFLEX

## 2019-06-14 LAB — CANNABINOID (GC/MS), URINE
Cannabinoid: POSITIVE — AB
Carboxy THC (GC/MS): >300 ng/mL

## 2019-06-14 LAB — MONITOR DRUG PROFILE 14(MW)
Amphetamine Scrn, Ur: NEGATIVE ng/mL
BARBITURATE SCREEN URINE: NEGATIVE ng/mL
BENZODIAZEPINE SCREEN, URINE: NEGATIVE ng/mL
Buprenorphine, Urine: NEGATIVE ng/mL
Cocaine (Metab) Scrn, Ur: NEGATIVE ng/mL
Creatinine(Crt), U: 230.3 mg/dL (ref 20.0–300.0)
Fentanyl, Urine: NEGATIVE pg/mL
Meperidine Screen, Urine: NEGATIVE ng/mL
Methadone Screen, Urine: NEGATIVE ng/mL
OXYCODONE+OXYMORPHONE UR QL SCN: NEGATIVE ng/mL
Opiate Scrn, Ur: NEGATIVE ng/mL
Ph of Urine: 6.4 (ref 4.5–8.9)
Phencyclidine Qn, Ur: NEGATIVE ng/mL
Propoxyphene Scrn, Ur: NEGATIVE ng/mL
SPECIFIC GRAVITY: 1.027
Tramadol Screen, Urine: NEGATIVE ng/mL

## 2019-06-14 LAB — NICOTINE SCREEN, URINE: Cotinine Ql Scrn, Ur: NEGATIVE ng/mL

## 2019-07-05 DIAGNOSIS — Z3682 Encounter for antenatal screening for nuchal translucency: Secondary | ICD-10-CM | POA: Diagnosis not present

## 2019-07-05 DIAGNOSIS — Z3689 Encounter for other specified antenatal screening: Secondary | ICD-10-CM | POA: Diagnosis not present

## 2019-07-05 DIAGNOSIS — Z3A11 11 weeks gestation of pregnancy: Secondary | ICD-10-CM | POA: Diagnosis not present

## 2019-07-05 DIAGNOSIS — O99211 Obesity complicating pregnancy, first trimester: Secondary | ICD-10-CM | POA: Diagnosis not present

## 2019-07-26 ENCOUNTER — Encounter: Payer: Self-pay | Admitting: Certified Nurse Midwife

## 2019-07-26 ENCOUNTER — Ambulatory Visit (INDEPENDENT_AMBULATORY_CARE_PROVIDER_SITE_OTHER): Payer: Medicaid Other | Admitting: Certified Nurse Midwife

## 2019-07-26 ENCOUNTER — Other Ambulatory Visit: Payer: Self-pay

## 2019-07-26 VITALS — BP 95/64 | HR 72 | Wt 231.8 lb

## 2019-07-26 DIAGNOSIS — O26811 Pregnancy related exhaustion and fatigue, first trimester: Secondary | ICD-10-CM

## 2019-07-26 DIAGNOSIS — Z3A13 13 weeks gestation of pregnancy: Secondary | ICD-10-CM

## 2019-07-26 DIAGNOSIS — Z3482 Encounter for supervision of other normal pregnancy, second trimester: Secondary | ICD-10-CM | POA: Diagnosis not present

## 2019-07-26 LAB — POCT URINALYSIS DIPSTICK OB
Bilirubin, UA: NEGATIVE
Blood, UA: NEGATIVE
Glucose, UA: NEGATIVE
Ketones, UA: NEGATIVE
Nitrite, UA: NEGATIVE
Spec Grav, UA: 1.02 (ref 1.010–1.025)
Urobilinogen, UA: 0.2 E.U./dL
pH, UA: 7 (ref 5.0–8.0)

## 2019-07-26 MED ORDER — ASPIRIN EC 81 MG PO TBEC: 81.0000 mg | DELAYED_RELEASE_TABLET | Freq: Every day | ORAL | 2 refills | Status: DC

## 2019-07-26 NOTE — Progress Notes (Signed)
NEW OB HISTORY AND PHYSICAL  SUBJECTIVE:       Natalie Beck is a 29 y.o. G69P1001 female, Patient's last menstrual period was 04/15/2019 (approximate)., Estimated Date of Delivery: 01/27/20, [redacted]w[redacted]d, presents today for establishment of Prenatal Care. Seen at [facility] on 07/05/2019; living in [place], driving here.   Endorses food aversions and breast tenderness.   Attempting to wean oldest with pumping but engorgement continues. Discussed repeat prolactin level and MRI in the second trimester.   Prefers midwifery care. Request [facility] for genetic screening today.   Denies difficulty breathing or respiratory distress, chest pain, abdominal pain, vaginal bleeding, dysuria and leg pain or swelling.   History significant for primary cesarean section, desires trial of labor and obesity in pregnancy with normal A1c.   Gynecologic History  Patient's last menstrual period was 04/15/2019 (approximate).   Contraception: none  Last Pap: 02/2019. Results were: normal  Obstetric History OB History  Gravida Para Term Preterm AB Living  2 1 1     1   SAB TAB Ectopic Multiple Live Births        0 1    # Outcome Date GA Lbr Len/2nd Weight Sex Delivery Anes PTL Lv  2 Current           1 Term 08/17/17 [redacted]w[redacted]d  6 lb 5.9 oz (2.89 kg) F CS-LTranv Spinal  LIV     Complications: Fetal Intolerance    Past Medical History:  Diagnosis Date  . Anemia   . Asthma     Past Surgical History:  Procedure Laterality Date  . CESAREAN SECTION N/A 08/17/2017   Procedure: CESAREAN SECTION;  Surgeon: Harlin Heys, MD;  Location: [facility] ORS;  Service: Obstetrics;  Laterality: N/A;  . dental implant    . WISDOM TOOTH EXTRACTION      Current Outpatient Medications on File Prior to Visit  Medication Sig Dispense Refill  . albuterol (PROVENTIL HFA;VENTOLIN HFA) 108 (90 Base) MCG/ACT inhaler Inhale into the lungs every 6 (six) hours as needed for wheezing or shortness of breath.    Marland Kitchen OVER THE  COUNTER MEDICATION Take 1 tablet by mouth 3 (three) times daily. Sunflower Lithosyine    . Prenatal Vit-Fe Fumarate-FA (MULTIVITAMIN-PRENATAL) 27-0.8 MG TABS tablet Take 1 tablet by mouth daily at 12 noon.     No current facility-administered medications on file prior to visit.    Allergies  Allergen Reactions  . Citrus Dermatitis    Hypoallergenic dermatitis-tongue swells and itches  . Other     Pet dander    Social History   Socioeconomic History  . Marital status: Single    Spouse name: Not on file  . Number of children: Not on file  . Years of education: Not on file  . Highest education level: Not on file  Occupational History  . Not on file  Tobacco Use  . Smoking status: Former Research scientist (life sciences)  . Smokeless tobacco: Never Used  Substance and Sexual Activity  . Alcohol use: Not Currently    Alcohol/week: 2.0 standard drinks    Types: 2 Standard drinks or equivalent per week  . Drug use: No  . Sexual activity: Yes    Partners: Male    Birth control/protection: None  Other Topics Concern  . Not on file  Social History Narrative  . Not on file   Social Determinants of Health   Financial Resource Strain:   . Difficulty of Paying Living Expenses:   Food Insecurity:   . Worried  About Running Out of Food in the Last Year:   . Ran Out of Food in the Last Year:   Transportation Needs:   . Lack of Transportation (Medical):   Marland Kitchen Lack of Transportation (Non-Medical):   Physical Activity:   . Days of Exercise per Week:   . Minutes of Exercise per Session:   Stress:   . Feeling of Stress :   Social Connections:   . Frequency of Communication with Friends and Family:   . Frequency of Social Gatherings with Friends and Family:   . Attends Religious Services:   . Active Member of Clubs or Organizations:   . Attends Banker Meetings:   Marland Kitchen Marital Status:   Intimate Partner Violence:   . Fear of Current or Ex-Partner:   . Emotionally Abused:   Marland Kitchen Physically Abused:    . Sexually Abused:     Family History  Problem Relation Age of Onset  . Asthma Father   . Asthma Sister   . Hypertension Sister   . Rheum arthritis Paternal Grandmother   . Cancer Paternal Grandmother        lung  . Cirrhosis Maternal Grandmother   . Cancer Other        maternal great grandmother-leukemia  . Cancer Paternal Grandfather   . Breast cancer Neg Hx   . Ovarian cancer Neg Hx     The following portions of the patient's history were reviewed and updated as appropriate: allergies, current medications, past OB history, past medical history, past surgical history, past family history, past social history, and problem list.  Review of Systems:  ROS negative except as noted above. Information obtained from patient.   OBJECTIVE:  BP 95/64   Pulse 72   Wt 231 lb 12.8 oz (105.1 kg)   LMP 04/15/2019 (Approximate)   BMI 45.27 kg/m   Initial Physical Exam (New OB)  GENERAL APPEARANCE: alert, well appearing, in no apparent distress  HEAD: normocephalic, atraumatic  MOUTH: mucous membranes moist, pharynx normal without lesions  THYROID: no thyromegaly or masses present  BREASTS: not examined  LUNGS: clear to auscultation, no wheezes, rales or rhonchi, symmetric air entry  HEART: regular rate and rhythm, no murmurs  ABDOMEN: soft, nontender, nondistended, no abnormal masses, no epigastric pain, obese and FHT present  EXTREMITIES: no redness or tenderness in the calves or thighs, no edema  SKIN: normal coloration and turgor, no rashes  LYMPH NODES: no adenopathy palpable  NEUROLOGIC: alert, oriented, normal speech, no focal findings or movement disorder noted  PELVIC EXAM: not examined  ASSESSMENT: Normal pregnancy Obesity in pregnancy Lactating mother Previous cesarean section, desires VBAC  PLAN: Prenatal care New OB counseling: The patient has been given an overview regarding routine prenatal care. Recommendations regarding diet, weight gain, and  exercise in pregnancy were given. Prenatal testing, optional genetic testing, and ultrasound use in pregnancy were reviewed.  Benefits of Breast Feeding were discussed. The patient is encouraged to consider nursing her baby post partum. See orders

## 2019-07-26 NOTE — Progress Notes (Signed)
ROB-Pt present for initial prenatal care and NOB PE. Pt stated that she is doing well no problems. [facility] completed today.

## 2019-07-26 NOTE — Patient Instructions (Addendum)
Second Trimester of Pregnancy  The second trimester is from week 14 through week 27 (month 4 through 6). This is often the time in pregnancy that you feel your best. Often times, morning sickness has lessened or quit. You may have more energy, and you may get hungry more often. Your unborn baby is growing rapidly. At the end of the sixth month, he or she is about 9 inches long and weighs about 1 pounds. You will likely feel the baby move between 18 and 20 weeks of pregnancy. Follow these instructions at home: Medicines  Take over-the-counter and prescription medicines only as told by your doctor. Some medicines are safe and some medicines are not safe during pregnancy.  Take a prenatal vitamin that contains at least 600 micrograms (mcg) of folic acid.  If you have trouble pooping (constipation), take medicine that will make your stool soft (stool softener) if your doctor approves. Eating and drinking   Eat regular, healthy meals.  Avoid raw meat and uncooked cheese.  If you get low calcium from the food you eat, talk to your doctor about taking a daily calcium supplement.  Avoid foods that are high in fat and sugars, such as fried and sweet foods.  If you feel sick to your stomach (nauseous) or throw up (vomit): ? Eat 4 or 5 small meals a day instead of 3 large meals. ? Try eating a few soda crackers. ? Drink liquids between meals instead of during meals.  To prevent constipation: ? Eat foods that are high in fiber, like fresh fruits and vegetables, whole grains, and beans. ? Drink enough fluids to keep your pee (urine) clear or pale yellow. Activity  Exercise only as told by your doctor. Stop exercising if you start to have cramps.  Do not exercise if it is too hot, too humid, or if you are in a place of great height (high altitude).  Avoid heavy lifting.  Wear low-heeled shoes. Sit and stand up straight.  You can continue to have sex unless your doctor tells you not  to. Relieving pain and discomfort  Wear a good support bra if your breasts are tender.  Take warm water baths (sitz baths) to soothe pain or discomfort caused by hemorrhoids. Use hemorrhoid cream if your doctor approves.  Rest with your legs raised if you have leg cramps or low back pain.  If you develop puffy, bulging veins (varicose veins) in your legs: ? Wear support hose or compression stockings as told by your doctor. ? Raise (elevate) your feet for 15 minutes, 3-4 times a day. ? Limit salt in your food. Prenatal care  Write down your questions. Take them to your prenatal visits.  Keep all your prenatal visits as told by your doctor. This is important. Safety  Wear your seat belt when driving.  Make a list of emergency phone numbers, including numbers for family, friends, the hospital, and police and fire departments. General instructions  Ask your doctor about the right foods to eat or for help finding a counselor, if you need these services.  Ask your doctor about local prenatal classes. Begin classes before month 6 of your pregnancy.  Do not use hot tubs, steam rooms, or saunas.  Do not douche or use tampons or scented sanitary pads.  Do not cross your legs for long periods of time.  Visit your dentist if you have not done so. Use a soft toothbrush to brush your teeth. Floss gently.  Avoid all smoking, herbs,   and alcohol. Avoid drugs that are not approved by your doctor.  Do not use any products that contain nicotine or tobacco, such as cigarettes and e-cigarettes. If you need help quitting, ask your doctor.  Avoid cat litter boxes and soil used by cats. These carry germs that can cause birth defects in the baby and can cause a loss of your baby (miscarriage) or stillbirth. Contact a doctor if:  You have mild cramps or pressure in your lower belly.  You have pain when you pee (urinate).  You have bad smelling fluid coming from your vagina.  You continue to  feel sick to your stomach (nauseous), throw up (vomit), or have watery poop (diarrhea).  You have a nagging pain in your belly area.  You feel dizzy. Get help right away if:  You have a fever.  You are leaking fluid from your vagina.  You have spotting or bleeding from your vagina.  You have severe belly cramping or pain.  You lose or gain weight rapidly.  You have trouble catching your breath and have chest pain.  You notice sudden or extreme puffiness (swelling) of your face, hands, ankles, feet, or legs.  You have not felt the baby move in over an hour.  You have severe headaches that do not go away when you take medicine.  You have trouble seeing. Summary  The second trimester is from week 14 through week 27 (months 4 through 6). This is often the time in pregnancy that you feel your best.  To take care of yourself and your unborn baby, you will need to eat healthy meals, take medicines only if your doctor tells you to do so, and do activities that are safe for you and your baby.  Call your doctor if you get sick or if you notice anything unusual about your pregnancy. Also, call your doctor if you need help with the right food to eat, or if you want to know what activities are safe for you. This information is not intended to replace advice given to you by your health care provider. Make sure you discuss any questions you have with your health care provider. Document Revised: 07/31/2018 Document Reviewed: 05/14/2016 Elsevier Patient Education  [place]. Common Medications Safe in Pregnancy  Acne:      Constipation:  Benzoyl Peroxide     Colace  Clindamycin      Dulcolax Suppository  Topica Erythromycin     Fibercon  Salicylic Acid      Metamucil         Miralax AVOID:        Senakot   Accutane    Cough:  Retin-A       Cough Drops  Tetracycline      Phenergan w/ Codeine if Rx  Minocycline      Robitussin (Plain &  DM)  Antibiotics:     Crabs/Lice:  Ceclor       RID  Cephalosporins    AVOID:  E-Mycins      Kwell  Keflex  Macrobid/Macrodantin   Diarrhea:  Penicillin      Kao-Pectate  Zithromax      Imodium AD         PUSH FLUIDS AVOID:       Cipro     Fever:  Tetracycline      Tylenol (Regular or Extra  Minocycline       Strength)  Levaquin      Extra Strength-Do not  Exceed 8 tabs/24 hrs Caffeine:        <235m/day (equiv. To 1 cup of coffee or  approx. 3 12 oz sodas)         Gas: Cold/Hayfever:       Gas-X  Benadryl      Mylicon  Claritin       Phazyme  **Claritin-D        Chlor-Trimeton    Headaches:  Dimetapp      ASA-Free Excedrin  Drixoral-Non-Drowsy     Cold Compress  Mucinex (Guaifenasin)     Tylenol (Regular or Extra  Sudafed/Sudafed-12 Hour     Strength)  **Sudafed PE Pseudoephedrine   Tylenol Cold & Sinus     Vicks Vapor Rub  Zyrtec  **AVOID if Problems With Blood Pressure         Heartburn: Avoid lying down for at least 1 hour after meals  Aciphex      Maalox     Rash:  Milk of Magnesia     Benadryl    Mylanta       1% Hydrocortisone Cream  Pepcid  Pepcid Complete   Sleep Aids:  Prevacid      Ambien   Prilosec       Benadryl  Rolaids       Chamomile Tea  Tums (Limit 4/day)     Unisom  Zantac       Tylenol PM         Warm milk-add vanilla or  Hemorrhoids:       Sugar for taste  Anusol/Anusol H.C.  (RX: Analapram 2.5%)  Sugar Substitutes:  Hydrocortisone OTC     Ok in moderation  Preparation H      Tucks        Vaseline lotion applied to tissue with wiping    Herpes:     Throat:  Acyclovir      Oragel  Famvir  Valtrex     Vaccines:         Flu Shot Leg Cramps:       *Gardasil  Benadryl      Hepatitis A         Hepatitis B Nasal Spray:       Pneumovax  Saline Nasal Spray     Polio Booster         Tetanus Nausea:       Tuberculosis test or PPD  Vitamin B6 25 mg TID   AVOID:    Dramamine      *Gardasil  Emetrol       Live  Poliovirus  Ginger Root 250 mg QID    MMR (measles, mumps &  High Complex Carbs @ Bedtime    rebella)  Sea Bands-Accupressure    Varicella (Chickenpox)  Unisom 1/2 tab TID     *No known complications           If received before Pain:         Known pregnancy;   Darvocet       Resume series after  Lortab        Delivery  Percocet    Yeast:   Tramadol      Femstat  Tylenol 3      Gyne-lotrimin  Ultram       Monistat  Vicodin           MISC:         All Sunscreens  Hair Coloring/highlights          Insect Repellant's          (Including DEET)         Mystic Tans   Vaginal Birth After Cesarean Delivery  Vaginal birth after cesarean delivery (VBAC) is giving birth vaginally after previously delivering a baby through a cesarean section (C-section). A VBAC may be a safe option for you, depending on your health and other factors. It is important to discuss VBAC with your health care provider early in your pregnancy so you can understand the risks, benefits, and options. Having these discussions early will give you time to make your birth plan. Who are the best candidates for VBAC? The best candidates for VBAC are women who:  Have had one or two prior cesarean deliveries, and the incision made during the delivery was horizontal (low transverse).  Do not have a vertical (classical) scar on their uterus.  Have not had a tear in the wall of their uterus (uterine rupture).  Plan to have more pregnancies. A VBAC is also more likely to be successful:  In women who have previously given birth vaginally.  When labor starts by itself (spontaneously) before the due date. What are the benefits of VBAC? The benefits of delivering your baby vaginally instead of by a cesarean delivery include:  A shorter hospital stay.  A faster recovery time.  Less pain.  Avoiding risks associated with major surgery, such as infection and blood clots.  Less blood loss and less need for donated  blood (transfusions). What are the risks of VBAC? The main risk of attempting a VBAC is that it may fail, forcing your health care provider to deliver your baby by a C-section. Other risks are rare and include:  Tearing (rupture) of the scar from a past cesarean delivery.  Other risks associated with vaginal deliveries. If a repeat cesarean delivery is needed, the risks include:  Blood loss.  Infection.  Blood clot.  Damage to surrounding organs.  Removal of the uterus (hysterectomy), if it is damaged.  Placenta problems in future pregnancies. What else should I know about my options? Delivering a baby through a VBAC is similar to having a normal spontaneous vaginal delivery. Therefore, it is safe:  To try with twins.  For your health care provider to try to turn the baby from a breech position (external cephalic version) during labor.  With epidural analgesia for pain relief. Consider where you would like to deliver your baby. VBAC should be attempted in facilities where an emergency cesarean delivery can be performed. VBAC is not recommended for home births. Any changes in your health or your baby's health during your pregnancy may make it necessary to change your initial decision about VBAC. Your health care provider may recommend that you do not attempt a VBAC if:  Your baby's suspected weight is 8.8 lb (4 kg) or more.  You have preeclampsia. This is a condition that causes high blood pressure along with other symptoms, such as swelling and headaches.  You will have VBAC less than 19 months after your cesarean delivery.  You are past your due date.  You need to have labor started (induced) because your cervix is not ready for labor (unfavorable). Where to find more information  American Pregnancy Association: americanpregnancy.org  [employer] of Obstetricians and Gynecologists: acog.org Summary  Vaginal birth after cesarean delivery (VBAC) is giving birth  vaginally after previously delivering a baby through a cesarean section (C-section).  A VBAC may be a safe option for you, depending on your health and other factors.  Discuss VBAC with your health care provider early in your pregnancy so you can understand the risks, benefits, options, and have plenty of time to make your birth plan.  The main risk of attempting a VBAC is that it may fail, forcing your health care provider to deliver your baby by a C-section. Other risks are rare. This information is not intended to replace advice given to you by your health care provider. Make sure you discuss any questions you have with your health care provider. Document Revised: 08/04/2018 Document Reviewed: 07/16/2016 Elsevier Patient Education  [place].  Preparing for Vaginal Birth After Cesarean Delivery Vaginal birth after cesarean delivery (VBAC) is giving birth vaginally after previously delivering a baby through a cesarean section (C-section). You and your health are provider will discuss your options and whether you may be a good candidate for VBAC. What are my options? After a cesarean delivery, your options for future deliveries may include:  Scheduled repeat cesarean delivery. This is done in a hospital with an operating room.  Trial of labor after cesarean (TOLAC). A successful TOLAC results in a vaginal delivery. If it is not successful, you will need to have a cesarean delivery. TOLAC should be attempted in facilities where an emergency cesarean delivery can be performed. It should not be done as a home birth. Talk with your health care provider about the risks and benefits of each option early in your pregnancy. The best option for you will depend on your preferences and your overall health as well as your baby's. What should I know about my past cesarean delivery? It is important to know what type of incision was made in your uterus in a past cesarean delivery. The type of incision  can affect the success of your TOLAC. Types of incisions include:  Low transverse. This is a side-to-side cut low on your uterus. The scar on your skin looks like a horizontal line just above your pubic area. This type of cut is the most common and makes you a good candidate for TOLAC.  Low vertical. This is an up-and-down cut low on your uterus. The scar on your skin looks like a vertical line between your pubic area and belly button. This type of cut puts you at higher risk for problems during TOLAC.  High vertical or classical. This is an up-and-down cut high on your uterus. The scar on your skin looks like a vertical line that runs over the top of your belly button. This type of cut has the highest risk for problems and usually means that TOLAC is not an option. When is VBAC not an option? As you progress through your pregnancy, circumstances may change and you may need to reconsider your options. Your situation may also change even as you begin TOLAC. Your health care provider may not want you to attempt a VBAC if you:  Need to have labor started (induced) because your cervix is not ready for labor.  Have never had a vaginal delivery.  Have had more than two cesarean deliveries.  Are overdue.  Are pregnant with a very large baby.  Have a condition that causes high blood pressure (preeclampsia). Questions to ask your health care provider  Am I a good candidate for TOLAC?  What are my chances of a successful vaginal delivery?  Is my preferred birth location equipped for a TOLAC?  What are  my pain management options during a TOLAC? Where to find more information  American Congress of Obstetricians and Gynecologists: www.acog.[place]: www.midwife.org Summary  Vaginal birth after cesarean delivery (VBAC) is giving birth vaginally after previously delivering a baby through a cesarean section (C-section).  VBAC may be a safe and appropriate option  for you depending on your medical history and other risk factors. Talk with your health care provider about the options available to you, and the risks and benefits of each early in your pregnancy.  TOLAC should be attempted in facilities where emergency cesarean section procedures can be performed. This information is not intended to replace advice given to you by your health care provider. Make sure you discuss any questions you have with your health care provider. Document Revised: 08/04/2018 Document Reviewed: 07/18/2016 Elsevier Patient Education  [place] Weight Gain During Pregnancy, Adult A certain amount of weight gain during pregnancy is normal and healthy. How much weight you should gain depends on your overall health and a measurement called BMI (body mass index). BMI is an estimate of your body fat based on your height and weight. You can use an Freight forwarder to figure out your BMI, or you can ask your health care provider to calculate it for you at your next visit. Your recommended pregnancy weight gain is based on your pre-pregnancy BMI. General guidelines for a healthy total weight gain during pregnancy are listed below. If your BMI at or before the start of your pregnancy is:  Less than 18.5 (underweight), you should gain 28-40 lb (13-18 kg).  18.5-24.9 (normal weight), you should gain 25-35 lb (11-16 kg).  25-29.9 (overweight), you should gain 15-25 lb (7-11 kg).  30 or higher (obese), you should gain 11-20 lb (5-9 kg). These ranges vary depending on your individual health. If you are carrying more than one baby (multiples), it may be safe to gain more weight than these recommendations. If you gain less weight than recommended, that may be safe as long as your baby is growing and developing normally. How can unhealthy weight gain affect me and my baby? Gaining too much weight during pregnancy can lead to pregnancy complications, such as:  A temporary  form of diabetes that develops during pregnancy (gestational diabetes).  High blood pressure during pregnancy and protein in your urine (preeclampsia).  High blood pressure during pregnancy without protein in your urine (gestational hypertension).  Your baby having a high weight at birth, which may: ? Raise your risk of having a more difficult delivery or a surgical delivery (cesarean delivery, or C-section). ? Raise your child's risk of developing obesity during childhood. Not gaining enough weight can be life-threatening for your baby, and it may raise your baby's chances of:  Being born early (preterm).  Growing more slowly than normal during pregnancy (growth restriction).  Having a low weight at birth. What actions can I take to gain a healthy amount of weight during pregnancy? General instructions  Keep track of your weight gain during pregnancy.  Take over-the-counter and prescription medicines only as told by your health care provider. Take all prenatal supplements as directed.  Keep all health care visits during pregnancy (prenatal visits). These visits are a good time to discuss your weight gain. Your health care provider will weigh you at each visit to make sure you are gaining a healthy amount of weight. Nutrition   Eat a balanced, nutrient-rich diet. Eat plenty of: ? Fruits and  vegetables, such as berries and broccoli. ? Whole grains, such as millet, barley, whole-wheat breads and cereals, and oatmeal. ? Low-fat dairy products or non-dairy products such as almond milk or rice milk. ? Protein foods, such as lean meat, chicken, eggs, and legumes (such as peas, beans, soybeans, and lentils).  Avoid foods that are fried or have a lot of fat, salt (sodium), or sugar.  Drink enough fluid to keep your urine pale yellow.  Choose healthy snack and drink options when you are at work or on the go: ? Drink water. Avoid soda, sports drinks, and juices that have added  sugar. ? Avoid drinks with caffeine, such as coffee and energy drinks. ? Eat snacks that are high in protein, such as nuts, protein bars, and low-fat yogurt. ? Carry convenient snacks in your purse that do not need refrigeration, such as a pack of trail mix, an apple, or a granola bar.  If you need help improving your diet, work with a health care provider or a diet and nutrition specialist (dietitian). Activity   Exercise regularly, as told by your health care provider. ? If you were active before becoming pregnant, you may be able to continue your regular fitness activities. ? If you were not active before pregnancy, you may gradually build up to exercising for 30 or more minutes on most days of the week. This may include walking, swimming, or yoga.  Ask your health care provider what activities are safe for you. Talk with your health care provider about whether you may need to be excused from certain school or work activities. Where to find more information Learn more about managing your weight gain during pregnancy from:  American Pregnancy Association: www.americanpregnancy.org  [place] Department of Agriculture pregnancy weight gain calculator: Former[employer] Summary  Too much weight gain during pregnancy can lead to complications for you and your baby.  Find out your pre-pregnancy BMI to determine how much weight gain is healthy for you.  Eat nutritious foods and stay active.  Keep all of your prenatal visits as told by your health care provider. This information is not intended to replace advice given to you by your health care provider. Make sure you discuss any questions you have with your health care provider. Document Revised: 12/30/2018 Document Reviewed: 12/27/2016 Elsevier Patient Education  [place].  Exercise During Pregnancy Exercise is an important part of being healthy for people of all ages. Exercise improves the function of your heart and lungs  and helps you maintain strength, flexibility, and a healthy body weight. Exercise also boosts energy levels and elevates mood. Most women should exercise regularly during pregnancy. In rare cases, women with certain medical conditions or complications may be asked to limit or avoid exercise during pregnancy. How does this affect me? Along with maintaining general strength and flexibility, exercising during pregnancy can help:  Keep strength in muscles that are used during labor and childbirth.  Decrease low back pain.  Reduce symptoms of depression.  Control weight gain during pregnancy.  Reduce the risk of needing insulin if you develop diabetes during pregnancy.  Decrease the risk of cesarean delivery.  Speed up your recovery after giving birth. How does this affect my baby? Exercise can help you have a healthy pregnancy. Exercise does not cause premature birth. It will not cause your baby to weigh less at birth. What exercises can I do? Many exercises are safe for you to do during pregnancy. Do a variety of exercises that safely  increase your heart and breathing rates and help you build and maintain muscle strength. Do exercises exactly as told by your health care provider. You may do these exercises:  Walking or hiking.  Swimming.  Water aerobics.  Riding a stationary bike.  Strength training.  Modified yoga or Pilates. Tell your instructor that you are pregnant. Avoid overstretching, and avoid lying on your back for long periods of time.  Running or jogging. Only choose this type of exercise if you: ? Ran or jogged regularly before your pregnancy. ? Can run or jog and still talk in complete sentences. What exercises should I avoid? Depending on your level of fitness and whether you exercised regularly before your pregnancy, you may be told to limit high-intensity exercise. You can tell that you are exercising at a high intensity if you are breathing much harder and faster  and cannot hold a conversation while exercising. You must avoid:  Contact sports.  Activities that put you at risk for falling on or being hit in the belly, such as downhill skiing, water skiing, surfing, rock climbing, cycling, gymnastics, and horseback riding.  Scuba diving.  Skydiving.  Yoga or Pilates in a room that is heated to high temperatures.  Jogging or running, unless you ran or jogged regularly before your pregnancy. While jogging or running, you should always be able to talk in full sentences. Do not run or jog so fast that you are unable to have a conversation.  Do not exercise at more than 6,000 feet above sea level (high elevation) if you are not used to exercising at high elevation. How do I exercise in a safe way?   Avoid overheating. Do not exercise in very high temperatures.  Wear loose-fitting, breathable clothes.  Avoid dehydration. Drink enough water before, during, and after exercise to keep your urine pale yellow.  Avoid overstretching. Because of hormone changes during pregnancy, it is easy to overstretch muscles, tendons, and ligaments during pregnancy.  Start slowly and ask your health care provider to recommend the types of exercise that are safe for you.  Do not exercise to lose weight. Follow these instructions at home:  Exercise on most days or all days of the week. Try to exercise for 30 minutes a day, 5 days a week, unless your health care provider tells you not to.  If you actively exercised before your pregnancy and you are healthy, your health care provider may tell you to continue to do moderate to high-intensity exercise.  If you are just starting to exercise or did not exercise much before your pregnancy, your health care provider may tell you to do low to moderate-intensity exercise. Questions to ask your health care provider  Is exercise safe for me?  What are signs that I should stop exercising?  Does my health condition mean that I  should not exercise during pregnancy?  When should I avoid exercising during pregnancy? Stop exercising and contact a health care provider if: You have any unusual symptoms, such as:  Mild contractions of the uterus or cramps in the abdomen.  Dizziness that does not go away when you rest. Stop exercising and get help right away if: You have any unusual symptoms, such as:  Sudden, severe pain in your low back or your belly.  Mild contractions of the uterus or cramps in the abdomen that do not improve with rest and drinking fluids.  Chest pain.  Bleeding or fluid leaking from your vagina.  Shortness of breath. These  symptoms may represent a serious problem that is an emergency. Do not wait to see if the symptoms will go away. Get medical help right away. Call your local emergency services (911 in the [place] Do not drive yourself to the hospital. Summary  Most women should exercise regularly throughout pregnancy. In rare cases, women with certain medical conditions or complications may be asked to limit or avoid exercise during pregnancy.  Do not exercise to lose weight during pregnancy.  Your health care provider will tell you what level of physical activity is right for you.  Stop exercising and contact a health care provider if you have mild contractions of the uterus or cramps in the abdomen. Get help right away if these contractions or cramps do not improve with rest and drinking fluids.  Stop exercising and get help right away if you have sudden, severe pain in your low back or belly, chest pain, shortness of breath, or bleeding or leaking of fluid from your vagina. This information is not intended to replace advice given to you by your health care provider. Make sure you discuss any questions you have with your health care provider. Document Revised: 07/30/2018 Document Reviewed: 05/13/2018 Elsevier Patient Education  [place].   Common Medications Safe in  Pregnancy  Acne:      Constipation:  Benzoyl Peroxide     Colace  Clindamycin      Dulcolax Suppository  Topica Erythromycin     Fibercon  Salicylic Acid      Metamucil         Miralax AVOID:        Senakot   Accutane    Cough:  Retin-A       Cough Drops  Tetracycline      Phenergan w/ Codeine if Rx  Minocycline      Robitussin (Plain & DM)  Antibiotics:     Crabs/Lice:  Ceclor       RID  Cephalosporins    AVOID:  E-Mycins      Kwell  Keflex  Macrobid/Macrodantin   Diarrhea:  Penicillin      Kao-Pectate  Zithromax      Imodium AD         PUSH FLUIDS AVOID:       Cipro     Fever:  Tetracycline      Tylenol (Regular or Extra  Minocycline       Strength)  Levaquin      Extra Strength-Do not          Exceed 8 tabs/24 hrs Caffeine:        '200mg'$ /day (equiv. To 1 cup of coffee or  approx. 3 12 oz sodas)         Gas: Cold/Hayfever:       Gas-X  Benadryl      Mylicon  Claritin       Phazyme  **Claritin-D        Chlor-Trimeton    Headaches:  Dimetapp      ASA-Free Excedrin  Drixoral-Non-Drowsy     Cold Compress  Mucinex (Guaifenasin)     Tylenol (Regular or Extra  Sudafed/Sudafed-12 Hour     Strength)  **Sudafed PE Pseudoephedrine   Tylenol Cold & Sinus     Vicks Vapor Rub  Zyrtec  **AVOID if Problems With Blood Pressure         Heartburn: Avoid lying down for at least 1 hour after meals  Aciphex      Maalox  Rash:  Milk of Magnesia     Benadryl    Mylanta       1% Hydrocortisone Cream  Pepcid  Pepcid Complete   Sleep Aids:  Prevacid      Ambien   Prilosec       Benadryl  Rolaids       Chamomile Tea  Tums (Limit 4/day)     Unisom  Zantac       Tylenol PM         Warm milk-add vanilla or  Hemorrhoids:       Sugar for taste  Anusol/Anusol H.C.  (RX: Analapram 2.5%)  Sugar Substitutes:  Hydrocortisone OTC     Ok in moderation  Preparation H      Tucks        Vaseline lotion applied to tissue with  wiping    Herpes:     Throat:  Acyclovir      Oragel  Famvir  Valtrex     Vaccines:         Flu Shot Leg Cramps:       *Gardasil  Benadryl      Hepatitis A         Hepatitis B Nasal Spray:       Pneumovax  Saline Nasal Spray     Polio Booster         Tetanus Nausea:       Tuberculosis test or PPD  Vitamin B6 25 mg TID   AVOID:    Dramamine      *Gardasil  Emetrol       Live Poliovirus  Ginger Root 250 mg QID    MMR (measles, mumps &  High Complex Carbs @ Bedtime    rebella)  Sea Bands-Accupressure    Varicella (Chickenpox)  Unisom 1/2 tab TID     *No known complications           If received before Pain:         Known pregnancy;   Darvocet       Resume series after  Lortab        Delivery  Percocet    Yeast:   Tramadol      Femstat  Tylenol 3      Gyne-lotrimin  Ultram       Monistat  Vicodin           MISC:         All Sunscreens           Hair Coloring/highlights          Insect Repellant's          (Including DEET)         Mystic Tans  Second Trimester of Pregnancy  The second trimester is from week 14 through week 27 (month 4 through 6). This is often the time in pregnancy that you feel your best. Often times, morning sickness has lessened or quit. You may have more energy, and you may get hungry more often. Your unborn baby is growing rapidly. At the end of the sixth month, he or she is about 9 inches long and weighs about 1 pounds. You will likely feel the baby move between 18 and 20 weeks of pregnancy. Follow these instructions at home: Medicines  Take over-the-counter and prescription medicines only as told by your doctor. Some medicines are safe and some medicines are not safe during pregnancy.  Take a prenatal vitamin that contains at least 600 micrograms (  mcg) of folic acid.  If you have trouble pooping (constipation), take medicine that will make your stool soft (stool softener) if your doctor approves. Eating and drinking   Eat regular, healthy  meals.  Avoid raw meat and uncooked cheese.  If you get low calcium from the food you eat, talk to your doctor about taking a daily calcium supplement.  Avoid foods that are high in fat and sugars, such as fried and sweet foods.  If you feel sick to your stomach (nauseous) or throw up (vomit): ? Eat 4 or 5 small meals a day instead of 3 large meals. ? Try eating a few soda crackers. ? Drink liquids between meals instead of during meals.  To prevent constipation: ? Eat foods that are high in fiber, like fresh fruits and vegetables, whole grains, and beans. ? Drink enough fluids to keep your pee (urine) clear or pale yellow. Activity  Exercise only as told by your doctor. Stop exercising if you start to have cramps.  Do not exercise if it is too hot, too humid, or if you are in a place of great height (high altitude).  Avoid heavy lifting.  Wear low-heeled shoes. Sit and stand up straight.  You can continue to have sex unless your doctor tells you not to. Relieving pain and discomfort  Wear a good support bra if your breasts are tender.  Take warm water baths (sitz baths) to soothe pain or discomfort caused by hemorrhoids. Use hemorrhoid cream if your doctor approves.  Rest with your legs raised if you have leg cramps or low back pain.  If you develop puffy, bulging veins (varicose veins) in your legs: ? Wear support hose or compression stockings as told by your doctor. ? Raise (elevate) your feet for 15 minutes, 3-4 times a day. ? Limit salt in your food. Prenatal care  Write down your questions. Take them to your prenatal visits.  Keep all your prenatal visits as told by your doctor. This is important. Safety  Wear your seat belt when driving.  Make a list of emergency phone numbers, including numbers for family, friends, the hospital, and police and fire departments. General instructions  Ask your doctor about the right foods to eat or for help finding a counselor,  if you need these services.  Ask your doctor about local prenatal classes. Begin classes before month 6 of your pregnancy.  Do not use hot tubs, steam rooms, or saunas.  Do not douche or use tampons or scented sanitary pads.  Do not cross your legs for long periods of time.  Visit your dentist if you have not done so. Use a soft toothbrush to brush your teeth. Floss gently.  Avoid all smoking, herbs, and alcohol. Avoid drugs that are not approved by your doctor.  Do not use any products that contain nicotine or tobacco, such as cigarettes and e-cigarettes. If you need help quitting, ask your doctor.  Avoid cat litter boxes and soil used by cats. These carry germs that can cause birth defects in the baby and can cause a loss of your baby (miscarriage) or stillbirth. Contact a doctor if:  You have mild cramps or pressure in your lower belly.  You have pain when you pee (urinate).  You have bad smelling fluid coming from your vagina.  You continue to feel sick to your stomach (nauseous), throw up (vomit), or have watery poop (diarrhea).  You have a nagging pain in your belly area.  You feel  dizzy. Get help right away if:  You have a fever.  You are leaking fluid from your vagina.  You have spotting or bleeding from your vagina.  You have severe belly cramping or pain.  You lose or gain weight rapidly.  You have trouble catching your breath and have chest pain.  You notice sudden or extreme puffiness (swelling) of your face, hands, ankles, feet, or legs.  You have not felt the baby move in over an hour.  You have severe headaches that do not go away when you take medicine.  You have trouble seeing. Summary  The second trimester is from week 14 through week 27 (months 4 through 6). This is often the time in pregnancy that you feel your best.  To take care of yourself and your unborn baby, you will need to eat healthy meals, take medicines only if your doctor tells  you to do so, and do activities that are safe for you and your baby.  Call your doctor if you get sick or if you notice anything unusual about your pregnancy. Also, call your doctor if you need help with the right food to eat, or if you want to know what activities are safe for you. This information is not intended to replace advice given to you by your health care provider. Make sure you discuss any questions you have with your health care provider. Document Revised: 07/31/2018 Document Reviewed: 05/14/2016 Elsevier Patient Education  [place].

## 2019-07-27 ENCOUNTER — Encounter: Payer: Self-pay | Admitting: Certified Nurse Midwife

## 2019-07-27 DIAGNOSIS — E559 Vitamin D deficiency, unspecified: Secondary | ICD-10-CM | POA: Insufficient documentation

## 2019-07-27 LAB — CBC
Hematocrit: 35.3 % (ref 34.0–46.6)
Hemoglobin: 11.7 g/dL (ref 11.1–15.9)
MCH: 29.4 pg (ref 26.6–33.0)
MCHC: 33.1 g/dL (ref 31.5–35.7)
MCV: 89 fL (ref 79–97)
Platelets: 189 10*3/uL (ref 150–450)
RBC: 3.98 x10E6/uL (ref 3.77–5.28)
RDW: 15.9 % — ABNORMAL HIGH (ref 11.7–15.4)
WBC: 9.5 10*3/uL (ref 3.4–10.8)

## 2019-07-27 LAB — VITAMIN D 25 HYDROXY (VIT D DEFICIENCY, FRACTURES): Vit D, 25-Hydroxy: 24.6 ng/mL — ABNORMAL LOW (ref 30.0–100.0)

## 2019-07-30 ENCOUNTER — Other Ambulatory Visit (INDEPENDENT_AMBULATORY_CARE_PROVIDER_SITE_OTHER): Payer: Medicaid Other | Admitting: Certified Nurse Midwife

## 2019-07-30 DIAGNOSIS — E559 Vitamin D deficiency, unspecified: Secondary | ICD-10-CM

## 2019-07-30 MED ORDER — VITAMIN D (ERGOCALCIFEROL) 1.25 MG (50000 UNIT) PO CAPS: 50000.0000 [IU] | ORAL_CAPSULE | ORAL | 2 refills | Status: DC

## 2019-07-30 NOTE — Progress Notes (Signed)
Rx Vitamin D, see orders.    Gunnar Bulla, CNM [facility] 07/30/19 8:57 AM

## 2019-08-02 ENCOUNTER — Telehealth: Payer: Self-pay

## 2019-08-02 NOTE — Telephone Encounter (Signed)
Spoke with pt and informed her that her natera was completed and results were negative and she as having a female fetus.

## 2019-08-16 ENCOUNTER — Other Ambulatory Visit: Payer: Self-pay

## 2019-08-16 ENCOUNTER — Encounter: Payer: Medicaid Other | Admitting: Certified Nurse Midwife

## 2019-08-23 ENCOUNTER — Encounter: Payer: Self-pay | Admitting: Certified Nurse Midwife

## 2019-08-23 ENCOUNTER — Ambulatory Visit (INDEPENDENT_AMBULATORY_CARE_PROVIDER_SITE_OTHER): Payer: Medicaid Other | Admitting: Certified Nurse Midwife

## 2019-08-23 ENCOUNTER — Other Ambulatory Visit: Payer: Self-pay

## 2019-08-23 VITALS — BP 85/56 | HR 77 | Wt 237.1 lb

## 2019-08-23 DIAGNOSIS — Z3689 Encounter for other specified antenatal screening: Secondary | ICD-10-CM

## 2019-08-23 DIAGNOSIS — Z98891 History of uterine scar from previous surgery: Secondary | ICD-10-CM

## 2019-08-23 DIAGNOSIS — O26899 Other specified pregnancy related conditions, unspecified trimester: Secondary | ICD-10-CM | POA: Insufficient documentation

## 2019-08-23 DIAGNOSIS — R102 Pelvic and perineal pain: Secondary | ICD-10-CM

## 2019-08-23 DIAGNOSIS — Z3482 Encounter for supervision of other normal pregnancy, second trimester: Secondary | ICD-10-CM

## 2019-08-23 DIAGNOSIS — O34219 Maternal care for unspecified type scar from previous cesarean delivery: Secondary | ICD-10-CM

## 2019-08-23 DIAGNOSIS — Z3A17 17 weeks gestation of pregnancy: Secondary | ICD-10-CM

## 2019-08-23 LAB — POCT URINALYSIS DIPSTICK OB
Bilirubin, UA: NEGATIVE
Blood, UA: NEGATIVE
Glucose, UA: NEGATIVE
Ketones, UA: NEGATIVE
Leukocytes, UA: NEGATIVE
Nitrite, UA: NEGATIVE
POC,PROTEIN,UA: NEGATIVE
Spec Grav, UA: 1.025 (ref 1.010–1.025)
Urobilinogen, UA: 0.2 E.U./dL
pH, UA: 6 (ref 5.0–8.0)

## 2019-08-23 NOTE — Patient Instructions (Signed)
Second Trimester of Pregnancy  The second trimester is from week 14 through week 27 (month 4 through 6). This is often the time in pregnancy that you feel your best. Often times, morning sickness has lessened or quit. You may have more energy, and you may get hungry more often. Your unborn baby is growing rapidly. At the end of the sixth month, he or she is about 9 inches long and weighs about 1 pounds. You will likely feel the baby move between 18 and 20 weeks of pregnancy. Follow these instructions at home: Medicines  Take over-the-counter and prescription medicines only as told by your doctor. Some medicines are safe and some medicines are not safe during pregnancy.  Take a prenatal vitamin that contains at least 600 micrograms (mcg) of folic acid.  If you have trouble pooping (constipation), take medicine that will make your stool soft (stool softener) if your doctor approves. Eating and drinking   Eat regular, healthy meals.  Avoid raw meat and uncooked cheese.  If you get low calcium from the food you eat, talk to your doctor about taking a daily calcium supplement.  Avoid foods that are high in fat and sugars, such as fried and sweet foods.  If you feel sick to your stomach (nauseous) or throw up (vomit): ? Eat 4 or 5 small meals a day instead of 3 large meals. ? Try eating a few soda crackers. ? Drink liquids between meals instead of during meals.  To prevent constipation: ? Eat foods that are high in fiber, like fresh fruits and vegetables, whole grains, and beans. ? Drink enough fluids to keep your pee (urine) clear or pale yellow. Activity  Exercise only as told by your doctor. Stop exercising if you start to have cramps.  Do not exercise if it is too hot, too humid, or if you are in a place of great height (high altitude).  Avoid heavy lifting.  Wear low-heeled shoes. Sit and stand up straight.  You can continue to have sex unless your doctor tells you not  to. Relieving pain and discomfort  Wear a good support bra if your breasts are tender.  Take warm water baths (sitz baths) to soothe pain or discomfort caused by hemorrhoids. Use hemorrhoid cream if your doctor approves.  Rest with your legs raised if you have leg cramps or low back pain.  If you develop puffy, bulging veins (varicose veins) in your legs: ? Wear support hose or compression stockings as told by your doctor. ? Raise (elevate) your feet for 15 minutes, 3-4 times a day. ? Limit salt in your food. Prenatal care  Write down your questions. Take them to your prenatal visits.  Keep all your prenatal visits as told by your doctor. This is important. Safety  Wear your seat belt when driving.  Make a list of emergency phone numbers, including numbers for family, friends, the hospital, and police and fire departments. General instructions  Ask your doctor about the right foods to eat or for help finding a counselor, if you need these services.  Ask your doctor about local prenatal classes. Begin classes before month 6 of your pregnancy.  Do not use hot tubs, steam rooms, or saunas.  Do not douche or use tampons or scented sanitary pads.  Do not cross your legs for long periods of time.  Visit your dentist if you have not done so. Use a soft toothbrush to brush your teeth. Floss gently.  Avoid all smoking, herbs,   and alcohol. Avoid drugs that are not approved by your doctor.  Do not use any products that contain nicotine or tobacco, such as cigarettes and e-cigarettes. If you need help quitting, ask your doctor.  Avoid cat litter boxes and soil used by cats. These carry germs that can cause birth defects in the baby and can cause a loss of your baby (miscarriage) or stillbirth. Contact a doctor if:  You have mild cramps or pressure in your lower belly.  You have pain when you pee (urinate).  You have bad smelling fluid coming from your vagina.  You continue to  feel sick to your stomach (nauseous), throw up (vomit), or have watery poop (diarrhea).  You have a nagging pain in your belly area.  You feel dizzy. Get help right away if:  You have a fever.  You are leaking fluid from your vagina.  You have spotting or bleeding from your vagina.  You have severe belly cramping or pain.  You lose or gain weight rapidly.  You have trouble catching your breath and have chest pain.  You notice sudden or extreme puffiness (swelling) of your face, hands, ankles, feet, or legs.  You have not felt the baby move in over an hour.  You have severe headaches that do not go away when you take medicine.  You have trouble seeing. Summary  The second trimester is from week 14 through week 27 (months 4 through 6). This is often the time in pregnancy that you feel your best.  To take care of yourself and your unborn baby, you will need to eat healthy meals, take medicines only if your doctor tells you to do so, and do activities that are safe for you and your baby.  Call your doctor if you get sick or if you notice anything unusual about your pregnancy. Also, call your doctor if you need help with the right food to eat, or if you want to know what activities are safe for you. This information is not intended to replace advice given to you by your health care provider. Make sure you discuss any questions you have with your health care provider. Document Revised: 07/31/2018 Document Reviewed: 05/14/2016 Elsevier Patient Education  [place]. Common Medications Safe in Pregnancy  Acne:      Constipation:  Benzoyl Peroxide     Colace  Clindamycin      Dulcolax Suppository  Topica Erythromycin     Fibercon  Salicylic Acid      Metamucil         Miralax AVOID:        Senakot   Accutane    Cough:  Retin-A       Cough Drops  Tetracycline      Phenergan w/ Codeine if Rx  Minocycline      Robitussin (Plain &  DM)  Antibiotics:     Crabs/Lice:  Ceclor       RID  Cephalosporins    AVOID:  E-Mycins      Kwell  Keflex  Macrobid/Macrodantin   Diarrhea:  Penicillin      Kao-Pectate  Zithromax      Imodium AD         PUSH FLUIDS AVOID:       Cipro     Fever:  Tetracycline      Tylenol (Regular or Extra  Minocycline       Strength)  Levaquin      Extra Strength-Do not  Exceed 8 tabs/24 hrs Caffeine:        <235m/day (equiv. To 1 cup of coffee or  approx. 3 12 oz sodas)         Gas: Cold/Hayfever:       Gas-X  Benadryl      Mylicon  Claritin       Phazyme  **Claritin-D        Chlor-Trimeton    Headaches:  Dimetapp      ASA-Free Excedrin  Drixoral-Non-Drowsy     Cold Compress  Mucinex (Guaifenasin)     Tylenol (Regular or Extra  Sudafed/Sudafed-12 Hour     Strength)  **Sudafed PE Pseudoephedrine   Tylenol Cold & Sinus     Vicks Vapor Rub  Zyrtec  **AVOID if Problems With Blood Pressure         Heartburn: Avoid lying down for at least 1 hour after meals  Aciphex      Maalox     Rash:  Milk of Magnesia     Benadryl    Mylanta       1% Hydrocortisone Cream  Pepcid  Pepcid Complete   Sleep Aids:  Prevacid      Ambien   Prilosec       Benadryl  Rolaids       Chamomile Tea  Tums (Limit 4/day)     Unisom  Zantac       Tylenol PM         Warm milk-add vanilla or  Hemorrhoids:       Sugar for taste  Anusol/Anusol H.C.  (RX: Analapram 2.5%)  Sugar Substitutes:  Hydrocortisone OTC     Ok in moderation  Preparation H      Tucks        Vaseline lotion applied to tissue with wiping    Herpes:     Throat:  Acyclovir      Oragel  Famvir  Valtrex     Vaccines:         Flu Shot Leg Cramps:       *Gardasil  Benadryl      Hepatitis A         Hepatitis B Nasal Spray:       Pneumovax  Saline Nasal Spray     Polio Booster         Tetanus Nausea:       Tuberculosis test or PPD  Vitamin B6 25 mg TID   AVOID:    Dramamine      *Gardasil  Emetrol       Live  Poliovirus  Ginger Root 250 mg QID    MMR (measles, mumps &  High Complex Carbs @ Bedtime    rebella)  Sea Bands-Accupressure    Varicella (Chickenpox)  Unisom 1/2 tab TID     *No known complications           If received before Pain:         Known pregnancy;   Darvocet       Resume series after  Lortab        Delivery  Percocet    Yeast:   Tramadol      Femstat  Tylenol 3      Gyne-lotrimin  Ultram       Monistat  Vicodin           MISC:         All Sunscreens  Hair Coloring/highlights          Insect Repellant's          (Including DEET)         Mystic Tans Breastfeeding  Choosing to breastfeed is one of the best decisions you can make for yourself and your baby. A change in hormones during pregnancy causes your breasts to make breast milk in your milk-producing glands. Hormones prevent breast milk from being released before your baby is born. They also prompt milk flow after birth. Once breastfeeding has begun, thoughts of your baby, as well as his or her sucking or crying, can stimulate the release of milk from your milk-producing glands. Benefits of breastfeeding Research shows that breastfeeding offers many health benefits for infants and mothers. It also offers a cost-free and convenient way to feed your baby. For your baby  Your first milk (colostrum) helps your baby's digestive system to function better.  Special cells in your milk (antibodies) help your baby to fight off infections.  Breastfed babies are less likely to develop asthma, allergies, obesity, or type 2 diabetes. They are also at lower risk for sudden infant death syndrome (SIDS).  Nutrients in breast milk are better able to meet your baby's needs compared to infant formula.  Breast milk improves your baby's brain development. For you  Breastfeeding helps to create a very special bond between you and your baby.  Breastfeeding is convenient. Breast milk costs nothing and is always available at the  correct temperature.  Breastfeeding helps to burn calories. It helps you to lose the weight that you gained during pregnancy.  Breastfeeding makes your uterus return faster to its size before pregnancy. It also slows bleeding (lochia) after you give birth.  Breastfeeding helps to lower your risk of developing type 2 diabetes, osteoporosis, rheumatoid arthritis, cardiovascular disease, and breast, ovarian, uterine, and endometrial cancer later in life. Breastfeeding basics Starting breastfeeding  Find a comfortable place to sit or lie down, with your neck and back well-supported.  Place a pillow or a rolled-up blanket under your baby to bring him or her to the level of your breast (if you are seated). Nursing pillows are specially designed to help support your arms and your baby while you breastfeed.  Make sure that your baby's tummy (abdomen) is facing your abdomen.  Gently massage your breast. With your fingertips, massage from the outer edges of your breast inward toward the nipple. This encourages milk flow. If your milk flows slowly, you may need to continue this action during the feeding.  Support your breast with 4 fingers underneath and your thumb above your nipple (make the letter "C" with your hand). Make sure your fingers are well away from your nipple and your baby's mouth.  Stroke your baby's lips gently with your finger or nipple.  When your baby's mouth is open wide enough, quickly bring your baby to your breast, placing your entire nipple and as much of the areola as possible into your baby's mouth. The areola is the colored area around your nipple. ? More areola should be visible above your baby's upper lip than below the lower lip. ? Your baby's lips should be opened and extended outward (flanged) to ensure an adequate, comfortable latch. ? Your baby's tongue should be between his or her lower gum and your breast.  Make sure that your baby's mouth is correctly positioned  around your nipple (latched). Your baby's lips should create a seal on your breast and be turned   out (everted).  It is common for your baby to suck about 2-3 minutes in order to start the flow of breast milk. Latching Teaching your baby how to latch onto your breast properly is very important. An improper latch can cause nipple pain, decreased milk supply, and poor weight gain in your baby. Also, if your baby is not latched onto your nipple properly, he or she may swallow some air during feeding. This can make your baby fussy. Burping your baby when you switch breasts during the feeding can help to get rid of the air. However, teaching your baby to latch on properly is still the best way to prevent fussiness from swallowing air while breastfeeding. Signs that your baby has successfully latched onto your nipple  Silent tugging or silent sucking, without causing you pain. Infant's lips should be extended outward (flanged).  Swallowing heard between every 3-4 sucks once your milk has started to flow (after your let-down milk reflex occurs).  Muscle movement above and in front of his or her ears while sucking. Signs that your baby has not successfully latched onto your nipple  Sucking sounds or smacking sounds from your baby while breastfeeding.  Nipple pain. If you think your baby has not latched on correctly, slip your finger into the corner of your baby's mouth to break the suction and place it between your baby's gums. Attempt to start breastfeeding again. Signs of successful breastfeeding Signs from your baby  Your baby will gradually decrease the number of sucks or will completely stop sucking.  Your baby will fall asleep.  Your baby's body will relax.  Your baby will retain a small amount of milk in his or her mouth.  Your baby will let go of your breast by himself or herself. Signs from you  Breasts that have increased in firmness, weight, and size 1-3 hours after  feeding.  Breasts that are softer immediately after breastfeeding.  Increased milk volume, as well as a change in milk consistency and color by the fifth day of breastfeeding.  Nipples that are not sore, cracked, or bleeding. Signs that your baby is getting enough milk  Wetting at least 1-2 diapers during the first 24 hours after birth.  Wetting at least 5-6 diapers every 24 hours for the first week after birth. The urine should be clear or pale yellow by the age of 5 days.  Wetting 6-8 diapers every 24 hours as your baby continues to grow and develop.  At least 3 stools in a 24-hour period by the age of 5 days. The stool should be soft and yellow.  At least 3 stools in a 24-hour period by the age of 7 days. The stool should be seedy and yellow.  No loss of weight greater than 10% of birth weight during the first 3 days of life.  Average weight gain of 4-7 oz (113-198 g) per week after the age of 4 days.  Consistent daily weight gain by the age of 5 days, without weight loss after the age of 2 weeks. After a feeding, your baby may spit up a small amount of milk. This is normal. Breastfeeding frequency and duration Frequent feeding will help you make more milk and can prevent sore nipples and extremely full breasts (breast engorgement). Breastfeed when you feel the need to reduce the fullness of your breasts or when your baby shows signs of hunger. This is called "breastfeeding on demand." Signs that your baby is hungry include:  Increased alertness, activity,  or restlessness.  Movement of the head from side to side.  Opening of the mouth when the corner of the mouth or cheek is stroked (rooting).  Increased sucking sounds, smacking lips, cooing, sighing, or squeaking.  Hand-to-mouth movements and sucking on fingers or hands.  Fussing or crying. Avoid introducing a pacifier to your baby in the first 4-6 weeks after your baby is born. After this time, you may choose to use a  pacifier. Research has shown that pacifier use during the first year of a baby's life decreases the risk of sudden infant death syndrome (SIDS). Allow your baby to feed on each breast as long as he or she wants. When your baby unlatches or falls asleep while feeding from the first breast, offer the second breast. Because newborns are often sleepy in the first few weeks of life, you may need to awaken your baby to get him or her to feed. Breastfeeding times will vary from baby to baby. However, the following rules can serve as a guide to help you make sure that your baby is properly fed:  Newborns (babies 40 weeks of age or younger) may breastfeed every 1-3 hours.  Newborns should not go without breastfeeding for longer than 3 hours during the day or 5 hours during the night.  You should breastfeed your baby a minimum of 8 times in a 24-hour period. Breast milk pumping     Pumping and storing breast milk allows you to make sure that your baby is exclusively fed your breast milk, even at times when you are unable to breastfeed. This is especially important if you go back to work while you are still breastfeeding, or if you are not able to be present during feedings. Your lactation consultant can help you find a method of pumping that works best for you and give you guidelines about how long it is safe to store breast milk. Caring for your breasts while you breastfeed Nipples can become dry, cracked, and sore while breastfeeding. The following recommendations can help keep your breasts moisturized and healthy:  Avoid using soap on your nipples.  Wear a supportive bra designed especially for nursing. Avoid wearing underwire-style bras or extremely tight bras (sports bras).  Air-dry your nipples for 3-4 minutes after each feeding.  Use only cotton bra pads to absorb leaked breast milk. Leaking of breast milk between feedings is normal.  Use lanolin on your nipples after breastfeeding. Lanolin  helps to maintain your skin's normal moisture barrier. Pure lanolin is not harmful (not toxic) to your baby. You may also hand express a few drops of breast milk and gently massage that milk into your nipples and allow the milk to air-dry. In the first few weeks after giving birth, some women experience breast engorgement. Engorgement can make your breasts feel heavy, warm, and tender to the touch. Engorgement peaks within 3-5 days after you give birth. The following recommendations can help to ease engorgement:  Completely empty your breasts while breastfeeding or pumping. You may want to start by applying warm, moist heat (in the shower or with warm, water-soaked hand towels) just before feeding or pumping. This increases circulation and helps the milk flow. If your baby does not completely empty your breasts while breastfeeding, pump any extra milk after he or she is finished.  Apply ice packs to your breasts immediately after breastfeeding or pumping, unless this is too uncomfortable for you. To do this: ? Put ice in a plastic bag. ? Place a  towel between your skin and the bag. ? Leave the ice on for 20 minutes, 2-3 times a day.  Make sure that your baby is latched on and positioned properly while breastfeeding. If engorgement persists after 48 hours of following these recommendations, contact your health care provider or a Science writer. Overall health care recommendations while breastfeeding  Eat 3 healthy meals and 3 snacks every day. Well-nourished mothers who are breastfeeding need an additional 450-500 calories a day. You can meet this requirement by increasing the amount of a balanced diet that you eat.  Drink enough water to keep your urine pale yellow or clear.  Rest often, relax, and continue to take your prenatal vitamins to prevent fatigue, stress, and low vitamin and mineral levels in your body (nutrient deficiencies).  Do not use any products that contain nicotine or  tobacco, such as cigarettes and e-cigarettes. Your baby may be harmed by chemicals from cigarettes that pass into breast milk and exposure to secondhand smoke. If you need help quitting, ask your health care provider.  Avoid alcohol.  Do not use illegal drugs or marijuana.  Talk with your health care provider before taking any medicines. These include over-the-counter and prescription medicines as well as vitamins and herbal supplements. Some medicines that may be harmful to your baby can pass through breast milk.  It is possible to become pregnant while breastfeeding. If birth control is desired, ask your health care provider about options that will be safe while breastfeeding your baby. Where to find more information: [employer]: www.llli.org Contact a health care provider if:  You feel like you want to stop breastfeeding or have become frustrated with breastfeeding.  Your nipples are cracked or bleeding.  Your breasts are red, tender, or warm.  You have: ? Painful breasts or nipples. ? A swollen area on either breast. ? A fever or chills. ? Nausea or vomiting. ? Drainage other than breast milk from your nipples.  Your breasts do not become full before feedings by the fifth day after you give birth.  You feel sad and depressed.  Your baby is: ? Too sleepy to eat well. ? Having trouble sleeping. ? More than 19 week old and wetting fewer than 6 diapers in a 24-hour period. ? Not gaining weight by 61 days of age.  Your baby has fewer than 3 stools in a 24-hour period.  Your baby's skin or the white parts of his or her eyes become yellow. Get help right away if:  Your baby is overly tired (lethargic) and does not want to wake up and feed.  Your baby develops an unexplained fever. Summary  Breastfeeding offers many health benefits for infant and mothers.  Try to breastfeed your infant when he or she shows early signs of hunger.  Gently tickle or stroke  your baby's lips with your finger or nipple to allow the baby to open his or her mouth. Bring the baby to your breast. Make sure that much of the areola is in your baby's mouth. Offer one side and burp the baby before you offer the other side.  Talk with your health care provider or lactation consultant if you have questions or you face problems as you breastfeed. This information is not intended to replace advice given to you by your health care provider. Make sure you discuss any questions you have with your health care provider. Document Revised: 07/03/2017 Document Reviewed: 05/10/2016 Elsevier Patient Education  [place].

## 2019-08-23 NOTE — Progress Notes (Signed)
ROB-Pt present for routine prenatal care. Pt c/o of lower abd pressure. No other issues.

## 2019-08-23 NOTE — Progress Notes (Signed)
I have seen, interviewed, and examined the patient in conjunction with the [employer] Nurse Practitioner student and affirm the diagnosis and management plan.   Gunnar Bulla, CNM [facility], [facility] 08/23/19 11:30 AM

## 2019-08-23 NOTE — Progress Notes (Signed)
ROB- Doing well. Reports swelling to her labia and lower pelvic pressure. Feels like her milk is backed up. Still breastfeeding. Reports baby is self-weaning. Will follow up with lactation. See orders. Anticipatory guidance given. Reviewed red flags and when to call office.  RTC x 3-4 weeks for Anatomy Scan and ROB or sooner if needed.   Glorious Peach RN [facility] [employer] 08/23/19 10:55 AM

## 2019-09-17 ENCOUNTER — Other Ambulatory Visit: Payer: Self-pay

## 2019-09-17 ENCOUNTER — Encounter: Payer: Self-pay | Admitting: Certified Nurse Midwife

## 2019-09-17 ENCOUNTER — Ambulatory Visit (INDEPENDENT_AMBULATORY_CARE_PROVIDER_SITE_OTHER): Payer: Medicaid Other | Admitting: Certified Nurse Midwife

## 2019-09-17 VITALS — BP 92/51 | HR 78 | Wt 240.2 lb

## 2019-09-17 DIAGNOSIS — Z3A21 21 weeks gestation of pregnancy: Secondary | ICD-10-CM

## 2019-09-17 DIAGNOSIS — Z3482 Encounter for supervision of other normal pregnancy, second trimester: Secondary | ICD-10-CM

## 2019-09-17 LAB — POCT URINALYSIS DIPSTICK OB
Bilirubin, UA: NEGATIVE
Blood, UA: NEGATIVE
Glucose, UA: NEGATIVE
Ketones, UA: NEGATIVE
Leukocytes, UA: NEGATIVE
Nitrite, UA: NEGATIVE
POC,PROTEIN,UA: NEGATIVE
Spec Grav, UA: 1.015 (ref 1.010–1.025)
Urobilinogen, UA: 0.2 E.U./dL
pH, UA: 7 (ref 5.0–8.0)

## 2019-09-17 NOTE — Patient Instructions (Addendum)
Breastfeeding  Choosing to breastfeed is one of the best decisions you can make for yourself and your baby. A change in hormones during pregnancy causes your breasts to make breast milk in your milk-producing glands. Hormones prevent breast milk from being released before your baby is born. They also prompt milk flow after birth. Once breastfeeding has begun, thoughts of your baby, as well as his or her sucking or crying, can stimulate the release of milk from your milk-producing glands. Benefits of breastfeeding Research shows that breastfeeding offers many health benefits for infants and mothers. It also offers a cost-free and convenient way to feed your baby. For your baby  Your first milk (colostrum) helps your baby's digestive system to function better.  Special cells in your milk (antibodies) help your baby to fight off infections.  Breastfed babies are less likely to develop asthma, allergies, obesity, or type 2 diabetes. They are also at lower risk for sudden infant death syndrome (SIDS).  Nutrients in breast milk are better able to meet your baby's needs compared to infant formula.  Breast milk improves your baby's brain development. For you  Breastfeeding helps to create a very special bond between you and your baby.  Breastfeeding is convenient. Breast milk costs nothing and is always available at the correct temperature.  Breastfeeding helps to burn calories. It helps you to lose the weight that you gained during pregnancy.  Breastfeeding makes your uterus return faster to its size before pregnancy. It also slows bleeding (lochia) after you give birth.  Breastfeeding helps to lower your risk of developing type 2 diabetes, osteoporosis, rheumatoid arthritis, cardiovascular disease, and breast, ovarian, uterine, and endometrial cancer later in life. Breastfeeding basics Starting breastfeeding  Find a comfortable place to sit or lie down, with your neck and back  well-supported.  Place a pillow or a rolled-up blanket under your baby to bring him or her to the level of your breast (if you are seated). Nursing pillows are specially designed to help support your arms and your baby while you breastfeed.  Make sure that your baby's tummy (abdomen) is facing your abdomen.  Gently massage your breast. With your fingertips, massage from the outer edges of your breast inward toward the nipple. This encourages milk flow. If your milk flows slowly, you may need to continue this action during the feeding.  Support your breast with 4 fingers underneath and your thumb above your nipple (make the letter "C" with your hand). Make sure your fingers are well away from your nipple and your baby's mouth.  Stroke your baby's lips gently with your finger or nipple.  When your baby's mouth is open wide enough, quickly bring your baby to your breast, placing your entire nipple and as much of the areola as possible into your baby's mouth. The areola is the colored area around your nipple. ? More areola should be visible above your baby's upper lip than below the lower lip. ? Your baby's lips should be opened and extended outward (flanged) to ensure an adequate, comfortable latch. ? Your baby's tongue should be between his or her lower gum and your breast.  Make sure that your baby's mouth is correctly positioned around your nipple (latched). Your baby's lips should create a seal on your breast and be turned out (everted).  It is common for your baby to suck about 2-3 minutes in order to start the flow of breast milk. Latching Teaching your baby how to latch onto your breast properly is  very important. An improper latch can cause nipple pain, decreased milk supply, and poor weight gain in your baby. Also, if your baby is not latched onto your nipple properly, he or she may swallow some air during feeding. This can make your baby fussy. Burping your baby when you switch breasts  during the feeding can help to get rid of the air. However, teaching your baby to latch on properly is still the best way to prevent fussiness from swallowing air while breastfeeding. Signs that your baby has successfully latched onto your nipple  Silent tugging or silent sucking, without causing you pain. Infant's lips should be extended outward (flanged).  Swallowing heard between every 3-4 sucks once your milk has started to flow (after your let-down milk reflex occurs).  Muscle movement above and in front of his or her ears while sucking. Signs that your baby has not successfully latched onto your nipple  Sucking sounds or smacking sounds from your baby while breastfeeding.  Nipple pain. If you think your baby has not latched on correctly, slip your finger into the corner of your baby's mouth to break the suction and place it between your baby's gums. Attempt to start breastfeeding again. Signs of successful breastfeeding Signs from your baby  Your baby will gradually decrease the number of sucks or will completely stop sucking.  Your baby will fall asleep.  Your baby's body will relax.  Your baby will retain a small amount of milk in his or her mouth.  Your baby will let go of your breast by himself or herself. Signs from you  Breasts that have increased in firmness, weight, and size 1-3 hours after feeding.  Breasts that are softer immediately after breastfeeding.  Increased milk volume, as well as a change in milk consistency and color by the fifth day of breastfeeding.  Nipples that are not sore, cracked, or bleeding. Signs that your baby is getting enough milk  Wetting at least 1-2 diapers during the first 24 hours after birth.  Wetting at least 5-6 diapers every 24 hours for the first week after birth. The urine should be clear or pale yellow by the age of 5 days.  Wetting 6-8 diapers every 24 hours as your baby continues to grow and develop.  At least 3 stools in  a 24-hour period by the age of 5 days. The stool should be soft and yellow.  At least 3 stools in a 24-hour period by the age of 7 days. The stool should be seedy and yellow.  No loss of weight greater than 10% of birth weight during the first 3 days of life.  Average weight gain of 4-7 oz (113-198 g) per week after the age of 4 days.  Consistent daily weight gain by the age of 5 days, without weight loss after the age of 2 weeks. After a feeding, your baby may spit up a small amount of milk. This is normal. Breastfeeding frequency and duration Frequent feeding will help you make more milk and can prevent sore nipples and extremely full breasts (breast engorgement). Breastfeed when you feel the need to reduce the fullness of your breasts or when your baby shows signs of hunger. This is called "breastfeeding on demand." Signs that your baby is hungry include:  Increased alertness, activity, or restlessness.  Movement of the head from side to side.  Opening of the mouth when the corner of the mouth or cheek is stroked (rooting).  Increased sucking sounds, smacking lips, cooing,   sighing, or squeaking.  Hand-to-mouth movements and sucking on fingers or hands.  Fussing or crying. Avoid introducing a pacifier to your baby in the first 4-6 weeks after your baby is born. After this time, you may choose to use a pacifier. Research has shown that pacifier use during the first year of a baby's life decreases the risk of sudden infant death syndrome (SIDS). Allow your baby to feed on each breast as long as he or she wants. When your baby unlatches or falls asleep while feeding from the first breast, offer the second breast. Because newborns are often sleepy in the first few weeks of life, you may need to awaken your baby to get him or her to feed. Breastfeeding times will vary from baby to baby. However, the following rules can serve as a guide to help you make sure that your baby is properly  fed:  Newborns (babies 41 weeks of age or younger) may breastfeed every 1-3 hours.  Newborns should not go without breastfeeding for longer than 3 hours during the day or 5 hours during the night.  You should breastfeed your baby a minimum of 8 times in a 24-hour period. Breast milk pumping     Pumping and storing breast milk allows you to make sure that your baby is exclusively fed your breast milk, even at times when you are unable to breastfeed. This is especially important if you go back to work while you are still breastfeeding, or if you are not able to be present during feedings. Your lactation consultant can help you find a method of pumping that works best for you and give you guidelines about how long it is safe to store breast milk. Caring for your breasts while you breastfeed Nipples can become dry, cracked, and sore while breastfeeding. The following recommendations can help keep your breasts moisturized and healthy:  Avoid using soap on your nipples.  Wear a supportive bra designed especially for nursing. Avoid wearing underwire-style bras or extremely tight bras (sports bras).  Air-dry your nipples for 3-4 minutes after each feeding.  Use only cotton bra pads to absorb leaked breast milk. Leaking of breast milk between feedings is normal.  Use lanolin on your nipples after breastfeeding. Lanolin helps to maintain your skin's normal moisture barrier. Pure lanolin is not harmful (not toxic) to your baby. You may also hand express a few drops of breast milk and gently massage that milk into your nipples and allow the milk to air-dry. In the first few weeks after giving birth, some women experience breast engorgement. Engorgement can make your breasts feel heavy, warm, and tender to the touch. Engorgement peaks within 3-5 days after you give birth. The following recommendations can help to ease engorgement:  Completely empty your breasts while breastfeeding or pumping. You may  want to start by applying warm, moist heat (in the shower or with warm, water-soaked hand towels) just before feeding or pumping. This increases circulation and helps the milk flow. If your baby does not completely empty your breasts while breastfeeding, pump any extra milk after he or she is finished.  Apply ice packs to your breasts immediately after breastfeeding or pumping, unless this is too uncomfortable for you. To do this: ? Put ice in a plastic bag. ? Place a towel between your skin and the bag. ? Leave the ice on for 20 minutes, 2-3 times a day.  Make sure that your baby is latched on and positioned properly while breastfeeding. If  engorgement persists after 48 hours of following these recommendations, contact your health care provider or a [employer]. Overall health care recommendations while breastfeeding  Eat 3 healthy meals and 3 snacks every day. Well-nourished mothers who are breastfeeding need an additional 450-500 calories a day. You can meet this requirement by increasing the amount of a balanced diet that you eat.  Drink enough water to keep your urine pale yellow or clear.  Rest often, relax, and continue to take your prenatal vitamins to prevent fatigue, stress, and low vitamin and mineral levels in your body (nutrient deficiencies).  Do not use any products that contain nicotine or tobacco, such as cigarettes and e-cigarettes. Your baby may be harmed by chemicals from cigarettes that pass into breast milk and exposure to secondhand smoke. If you need help quitting, ask your health care provider.  Avoid alcohol.  Do not use illegal drugs or marijuana.  Talk with your health care provider before taking any medicines. These include over-the-counter and prescription medicines as well as vitamins and herbal supplements. Some medicines that may be harmful to your baby can pass through breast milk.  It is possible to become pregnant while breastfeeding. If birth  control is desired, ask your health care provider about options that will be safe while breastfeeding your baby. Where to find more information: [employer]: www.llli.org Contact a health care provider if:  You feel like you want to stop breastfeeding or have become frustrated with breastfeeding.  Your nipples are cracked or bleeding.  Your breasts are red, tender, or warm.  You have: ? Painful breasts or nipples. ? A swollen area on either breast. ? A fever or chills. ? Nausea or vomiting. ? Drainage other than breast milk from your nipples.  Your breasts do not become full before feedings by the fifth day after you give birth.  You feel sad and depressed.  Your baby is: ? Too sleepy to eat well. ? Having trouble sleeping. ? More than 53 week old and wetting fewer than 6 diapers in a 24-hour period. ? Not gaining weight by 70 days of age.  Your baby has fewer than 3 stools in a 24-hour period.  Your baby's skin or the white parts of his or her eyes become yellow. Get help right away if:  Your baby is overly tired (lethargic) and does not want to wake up and feed.  Your baby develops an unexplained fever. Summary  Breastfeeding offers many health benefits for infant and mothers.  Try to breastfeed your infant when he or she shows early signs of hunger.  Gently tickle or stroke your baby's lips with your finger or nipple to allow the baby to open his or her mouth. Bring the baby to your breast. Make sure that much of the areola is in your baby's mouth. Offer one side and burp the baby before you offer the other side.  Talk with your health care provider or lactation consultant if you have questions or you face problems as you breastfeed. This information is not intended to replace advice given to you by your health care provider. Make sure you discuss any questions you have with your health care provider. Document Revised: 07/03/2017 Document Reviewed:  05/10/2016 Elsevier Patient Education  2020 [employer] Inc[employer]  [employer] of the uterus can occur throughout pregnancy, but they are not always a sign that you are in labor. You may have practice contractions called Braxton Hicks contractions. These false labor  contractions are sometimes confused with true labor. What are [place] contractions? Braxton Hicks contractions are tightening movements that occur in the muscles of the uterus before labor. Unlike true labor contractions, these contractions do not result in opening (dilation) and thinning of the cervix. Toward the end of pregnancy (32-34 weeks), Braxton Hicks contractions can happen more often and may become stronger. These contractions are sometimes difficult to tell apart from true labor because they can be very uncomfortable. You should not feel embarrassed if you go to the hospital with false labor. Sometimes, the only way to tell if you are in true labor is for your health care provider to look for changes in the cervix. The health care provider will do a physical exam and may monitor your contractions. If you are not in true labor, the exam should show that your cervix is not dilating and your water has not broken. If there are no other health problems associated with your pregnancy, it is completely safe for you to be sent home with false labor. You may continue to have Braxton Hicks contractions until you go into true labor. How to tell the difference between true labor and false labor True labor  Contractions last 30-70 seconds.  Contractions become very regular.  Discomfort is usually felt in the top of the uterus, and it spreads to the lower abdomen and low back.  Contractions do not go away with walking.  Contractions usually become more intense and increase in frequency.  The cervix dilates and gets thinner. False labor  Contractions are usually shorter and not as strong as true labor  contractions.  Contractions are usually irregular.  Contractions are often felt in the front of the lower abdomen and in the groin.  Contractions may go away when you walk around or change positions while lying down.  Contractions get weaker and are shorter-lasting as time goes on.  The cervix usually does not dilate or become thin. Follow these instructions at home:   Take over-the-counter and prescription medicines only as told by your health care provider.  Keep up with your usual exercises and follow other instructions from your health care provider.  Eat and drink lightly if you think you are going into labor.  If Braxton Hicks contractions are making you uncomfortable: ? Change your position from lying down or resting to walking, or change from walking to resting. ? Sit and rest in a tub of warm water. ? Drink enough fluid to keep your urine pale yellow. Dehydration may cause these contractions. ? Do slow and deep breathing several times an hour.  Keep all follow-up prenatal visits as told by your health care provider. This is important. Contact a health care provider if:  You have a fever.  You have continuous pain in your abdomen. Get help right away if:  Your contractions become stronger, more regular, and closer together.  You have fluid leaking or gushing from your vagina.  You pass blood-tinged mucus (bloody show).  You have bleeding from your vagina.  You have low back pain that you never had before.  You feel your baby's head pushing down and causing pelvic pressure.  Your baby is not moving inside you as much as it used to. Summary  Contractions that occur before labor are called Braxton Hicks contractions, false labor, or practice contractions.  Braxton Hicks contractions are usually shorter, weaker, farther apart, and less regular than true labor contractions. True labor contractions usually become progressively stronger and regular,  and they become  more frequent.  Manage discomfort from [facility] contractions by changing position, resting in a warm bath, drinking plenty of water, or practicing deep breathing. This information is not intended to replace advice given to you by your health care provider. Make sure you discuss any questions you have with your health care provider. Document Revised: 03/21/2017 Document Reviewed: 08/22/2016 Elsevier Patient Education  2020 [employer].    Second Trimester of Pregnancy  The second trimester is from week 14 through week 27 (month 4 through 6). This is often the time in pregnancy that you feel your best. Often times, morning sickness has lessened or quit. You may have more energy, and you may get hungry more often. Your unborn baby is growing rapidly. At the end of the sixth month, he or she is about 9 inches long and weighs about 1 pounds. You will likely feel the baby move between 18 and 20 weeks of pregnancy. Follow these instructions at home: Medicines  Take over-the-counter and prescription medicines only as told by your doctor. Some medicines are safe and some medicines are not safe during pregnancy.  Take a prenatal vitamin that contains at least 600 micrograms (mcg) of folic acid.  If you have trouble pooping (constipation), take medicine that will make your stool soft (stool softener) if your doctor approves. Eating and drinking   Eat regular, healthy meals.  Avoid raw meat and uncooked cheese.  If you get low calcium from the food you eat, talk to your doctor about taking a daily calcium supplement.  Avoid foods that are high in fat and sugars, such as fried and sweet foods.  If you feel sick to your stomach (nauseous) or throw up (vomit): ? Eat 4 or 5 small meals a day instead of 3 large meals. ? Try eating a few soda crackers. ? Drink liquids between meals instead of during meals.  To prevent constipation: ? Eat foods that are high in fiber, like fresh fruits and  vegetables, whole grains, and beans. ? Drink enough fluids to keep your pee (urine) clear or pale yellow. Activity  Exercise only as told by your doctor. Stop exercising if you start to have cramps.  Do not exercise if it is too hot, too humid, or if you are in a place of great height (high altitude).  Avoid heavy lifting.  Wear low-heeled shoes. Sit and stand up straight.  You can continue to have sex unless your doctor tells you not to. Relieving pain and discomfort  Wear a good support bra if your breasts are tender.  Take warm water baths (sitz baths) to soothe pain or discomfort caused by hemorrhoids. Use hemorrhoid cream if your doctor approves.  Rest with your legs raised if you have leg cramps or low back pain.  If you develop puffy, bulging veins (varicose veins) in your legs: ? Wear support hose or compression stockings as told by your doctor. ? Raise (elevate) your feet for 15 minutes, 3-4 times a day. ? Limit salt in your food. Prenatal care  Write down your questions. Take them to your prenatal visits.  Keep all your prenatal visits as told by your doctor. This is important. Safety  Wear your seat belt when driving.  Make a list of emergency phone numbers, including numbers for family, friends, the hospital, and police and fire departments. General instructions  Ask your doctor about the right foods to eat or for help finding a counselor, if you need these  services.  Ask your doctor about local prenatal classes. Begin classes before month 6 of your pregnancy.  Do not use hot tubs, steam rooms, or saunas.  Do not douche or use tampons or scented sanitary pads.  Do not cross your legs for long periods of time.  Visit your dentist if you have not done so. Use a soft toothbrush to brush your teeth. Floss gently.  Avoid all smoking, herbs, and alcohol. Avoid drugs that are not approved by your doctor.  Do not use any products that contain nicotine or  tobacco, such as cigarettes and e-cigarettes. If you need help quitting, ask your doctor.  Avoid cat litter boxes and soil used by cats. These carry germs that can cause birth defects in the baby and can cause a loss of your baby (miscarriage) or stillbirth. Contact a doctor if:  You have mild cramps or pressure in your lower belly.  You have pain when you pee (urinate).  You have bad smelling fluid coming from your vagina.  You continue to feel sick to your stomach (nauseous), throw up (vomit), or have watery poop (diarrhea).  You have a nagging pain in your belly area.  You feel dizzy. Get help right away if:  You have a fever.  You are leaking fluid from your vagina.  You have spotting or bleeding from your vagina.  You have severe belly cramping or pain.  You lose or gain weight rapidly.  You have trouble catching your breath and have chest pain.  You notice sudden or extreme puffiness (swelling) of your face, hands, ankles, feet, or legs.  You have not felt the baby move in over an hour.  You have severe headaches that do not go away when you take medicine.  You have trouble seeing. Summary  The second trimester is from week 14 through week 27 (months 4 through 6). This is often the time in pregnancy that you feel your best.  To take care of yourself and your unborn baby, you will need to eat healthy meals, take medicines only if your doctor tells you to do so, and do activities that are safe for you and your baby.  Call your doctor if you get sick or if you notice anything unusual about your pregnancy. Also, call your doctor if you need help with the right food to eat, or if you want to know what activities are safe for you. This information is not intended to replace advice given to you by your health care provider. Make sure you discuss any questions you have with your health care provider. Document Revised: 07/31/2018 Document Reviewed: 05/14/2016 Elsevier  Patient Education  2020 [employer].

## 2019-09-17 NOTE — Progress Notes (Signed)
ROB- Doing well. Taking ASA daily. Reports braxton hick contractions. Encouraged home treatment measures including increased daily water and protein intake. Anatomy scan scheduled for next week. Patient will follow up to discuss results. Discussed twenty-eight (28) week labs with patient.  Anticipatory guidance given. Reviewed red flags and when to call the office.  RTC x 5 weeks for ROB and 28 week labs or sooner if needed.   Glorious Peach RN [facility] [employer] 09/17/19 12:22 PM

## 2019-09-17 NOTE — Progress Notes (Signed)
I have seen, interviewed, and examined the patient in conjunction with the [employer] Nurse [facility] Health Practitioner student and affirm the diagnosis and management plan.   Gunnar Bulla, CNM [facility], [facility] 09/17/19 12:58 PM

## 2019-09-17 NOTE — Progress Notes (Signed)
ROB-Pt present for routine prenatal care. Pt stated having possible braxton hick contractions but unsure. Pt stated having off and on tightness in her abd area.

## 2019-09-21 ENCOUNTER — Other Ambulatory Visit: Payer: Self-pay

## 2019-09-21 ENCOUNTER — Encounter: Payer: Medicaid Other | Admitting: Certified Nurse Midwife

## 2019-09-21 ENCOUNTER — Telehealth: Payer: Self-pay | Admitting: Certified Nurse Midwife

## 2019-09-21 ENCOUNTER — Ambulatory Visit (INDEPENDENT_AMBULATORY_CARE_PROVIDER_SITE_OTHER): Payer: Medicaid Other

## 2019-09-21 DIAGNOSIS — Z3A17 17 weeks gestation of pregnancy: Secondary | ICD-10-CM

## 2019-09-21 DIAGNOSIS — Z3482 Encounter for supervision of other normal pregnancy, second trimester: Secondary | ICD-10-CM

## 2019-09-21 DIAGNOSIS — Z3689 Encounter for other specified antenatal screening: Secondary | ICD-10-CM

## 2019-09-21 NOTE — Telephone Encounter (Signed)
This patient called in being very rude, I answered the phone and she responded with "Natalie Beck please.", I asked the patient what her name and DOB was- she informed me, and I asked her if CM was expecting her phone call where the patient responded with "Not sure.", Asked the patient what she needed to speak with Crystal regarding and patient responded with "Why cant I just speak with Natalie Beck about what I'm calling about.". Placed patient on hold and called back to CM, CM was busy speaking to someone else. Informed patient this and I asked if she would like to leave a message where the patient responded, "Nope, I'll just call back later." And patient hung up the phone.

## 2019-09-21 NOTE — Telephone Encounter (Signed)
I instructed the patient to call you regarding her interaction with the ultrasound tech. Please contact the patient. Thanks, JML

## 2019-09-21 NOTE — Telephone Encounter (Signed)
Call transferred from front desk-  Pt states she had a u/s this morning. This is her 2nd pregnancy with [facility]. The 1st pregnancy she loved coming here.   At her initial scan at 6 weeks she states the scan was a little rough. She felt some discomfort. Did not think much about it.  The scan today was painful the entire session.She did not mention this to [place]. She states she has a hard time speaking up for herself. She realizes that she is overweight and is aware that the tech may have to bear down a little harder to see the baby. She was also frustrated by the lack of communication from [place]. She did ask Jenine if everything looked ok and Jenine states yes. When the scan was over Jenine wiped her hands on a towel and tossed it on Chelcis abdomen. At that point Aprile was so shocked. She went home to think about it and was like this is not right.   Jenine let her know that everything looked fine. She could clean up and head to the lobby. Her disc would be ready soon. When the pt got home her disk was blank.   I apologized for her interaction with Jenine. I thanked her for her feedback. She seemed very empathetic and thoughtful. I advised her that I would discuss with Jenine and get her a new disk. Will call pt when the disc is ready.   4:00- spoke with Rayann Heman- she states she did recall a pt that informed her she was bearing down to hard. She apologized and continued with the scan. She did not pick up on any issues with this pt that she is aware of. She apologized and wished the pt had spoken up. She states she did not toss a towel at the pt.  She would not do that. She puts a towel on the pts lap and she has a towel for herself. Rayann Heman states she will do a better job of communicating with the pts moving forward.   I will contact pt when disc is ready.

## 2019-09-23 ENCOUNTER — Ambulatory Visit (INDEPENDENT_AMBULATORY_CARE_PROVIDER_SITE_OTHER): Payer: Medicaid Other | Admitting: Certified Nurse Midwife

## 2019-09-23 ENCOUNTER — Other Ambulatory Visit: Payer: Self-pay

## 2019-09-23 DIAGNOSIS — Z3A22 22 weeks gestation of pregnancy: Secondary | ICD-10-CM

## 2019-09-23 DIAGNOSIS — Z3689 Encounter for other specified antenatal screening: Secondary | ICD-10-CM | POA: Diagnosis not present

## 2019-09-23 DIAGNOSIS — Z3492 Encounter for supervision of normal pregnancy, unspecified, second trimester: Secondary | ICD-10-CM | POA: Diagnosis not present

## 2019-09-23 NOTE — Progress Notes (Signed)
A call was placed to patient for televisit. DOB as identifier. Call transferred to [facility] CNM.

## 2019-09-23 NOTE — Telephone Encounter (Signed)
Informed pt that I had discussed her experience with Natalie Beck. Natalie Beck was very Counselling psychologist. She was unaware the exam was painful for the patient. She wished the pt would have let her know so she could accommodate her. Natalie Beck denies tossing a towel on the pts abd. She also states moving forward she will have more communication with the patients.   Natalie Beck made a new disc for the pt. It is up front for pick up. I did inform the pt that some laptops will not read the disc. She may [facility].  Pt appreciative of call.

## 2019-09-23 NOTE — Progress Notes (Signed)
Virtual Visit via Telephone Note  I connected with Jalynne A Luth on 09/23/19 at 11:15 AM EDT by telephone and verified that I am speaking with the correct person using two identifiers.  Location:  Patient: Natalie Beck (home)  Provider: Gunnar Bulla, CNM ([facility], office)   I discussed the limitations, risks, security and privacy concerns of performing an evaluation and management service by telephone and the availability of in person appointments. I also discussed with the patient that there may be a patient responsible charge related to this service. The patient expressed understanding and agreed to proceed.   History of Present Illness:  Patient is currently 22 weeks pregnancy with second child.   Anatomy scan was completed in office on Tuesday, 09/21/2019.   Today, we are discussing the results.   No questions or concerns.   Good fetal movement. Denies difficulty breathing or respiratory distress, chest pain, abdominal pain, vaginal bleeding, leaking of fluid, and leg pain or swelling.    Observations/Objective:  ULTRASOUND REPORT  Location: [facility] Date of Service: 09/21/2019   Indications:Anatomy Ultrasound Findings:  Singleton intrauterine pregnancy is visualized with FHR at 150 BPM. Biometrics give an (U/S) Gestational age of [redacted]w[redacted]d and an (U/S) EDD of 01/21/2020; this correlates with the clinically established Estimated Date of Delivery: 01/27/20  Fetal presentation is Variable.  EFW: 493 g ( 1 lb 1 oz).  Placenta: posterior. Grade: 1 AFI: subjectively normal.  Anatomic survey is complete and normal; Gender - female.    Right Ovary is normal in appearance. Left Ovary is normal appearance. Survey of the adnexa demonstrates no adnexal masses. There is no free peritoneal fluid in the cul de sac.  Impression: 1. [redacted]w[redacted]d Viable Singleton Intrauterine pregnancy by U/S. 2. (U/S) EDD is consistent with Clinically established  Estimated Date of Delivery: 01/27/20 . 3. Normal Anatomy Scan  Recommendations: 1.Clinical correlation with the patient's History and Physical Exam.  Assessment and Plan:  Second trimester pregnancy [redacted] week gestation of pregnancy Encounter for fetal anatomic survey  Follow Up Instructions:  Reviewed red flag symptoms and when to call.   Continue prenatal care as scheduled or sooner if needed.   I discussed the assessment and treatment plan with the patient. The patient was provided an opportunity to ask questions and all were answered. The patient agreed with the plan and demonstrated an understanding of the instructions.   The patient was advised to call back or seek an in-person evaluation if the symptoms worsen or if the condition fails to improve as anticipated.  I provided 6 minutes of non-face-to-face time during this encounter.   Gunnar Bulla, CNM [facility], [facility] 09/23/19 12:59 PM

## 2019-09-27 ENCOUNTER — Other Ambulatory Visit: Payer: Self-pay

## 2019-09-27 ENCOUNTER — Ambulatory Visit: Payer: Medicaid Other | Attending: Certified Nurse Midwife

## 2019-09-27 DIAGNOSIS — M62838 Other muscle spasm: Secondary | ICD-10-CM

## 2019-09-27 DIAGNOSIS — M533 Sacrococcygeal disorders, not elsewhere classified: Secondary | ICD-10-CM

## 2019-09-27 DIAGNOSIS — R293 Abnormal posture: Secondary | ICD-10-CM

## 2019-09-27 NOTE — Therapy (Signed)
[facility] MAIN [facility] SERVICES [address] [place], [place], [address] Phone: [phone]   Fax:  [phone]  Physical Therapy Evaluation  The patient has been informed of current processes in place at Outpatient Rehab to protect patients from Covid-19 exposure including social distancing, schedule modifications, and new cleaning procedures. After discussing their particular risk with a therapist based on the patient's personal risk factors, the patient has decided to proceed with in-person therapy.   Patient Details  Name: Natalie Beck MRN: [id number] Date of Birth: [birth year] No data recorded  Encounter Date: 09/27/2019  PT End of Session - 09/27/19 0850    Visit Number  1    Number of Visits  10    Date for PT Re-Evaluation  12/06/19    Authorization Type  mcaid    Authorization Time Period  09/27/19 through 10/25/19    Authorization - Visit Number  1    Authorization - Number of Visits  4    Progress Note Due on Visit  10    PT Start Time  0844    PT Stop Time  0930    PT Time Calculation (min)  46 min    Activity Tolerance  Patient tolerated treatment well;No increased pain    Behavior During Therapy  WFL for tasks assessed/performed       Past Medical History:  Diagnosis Date  . Anemia   . Asthma     Past Surgical History:  Procedure Laterality Date  . CESAREAN SECTION N/A 08/17/2017   Procedure: CESAREAN SECTION;  Surgeon: Linzie Collin, MD;  Location: [facility] ORS;  Service: Obstetrics;  Laterality: N/A;  . dental implant    . WISDOM TOOTH EXTRACTION      There were no vitals filed for this visit.  Pelvic Floor Physical Therapy Evaluation and Assessment  SCREENING  Falls in last 6 mo: none  Red Flags:  Have you had any night sweats? no Unexplained weight loss? no Saddle anesthesia? no Unexplained changes in bowel or bladder habits? no  SUBJECTIVE  Patient reports: Having pressure in the lower abdomen  starting within the last month. Stretching and moving and walking seem to help as well as laying down on her side. She notices it the most when she has been sitting longer than 30 minutes.   Had the same issue with last pregnancy but she was working then so it did not bother her as much, sitting seems to irritate it.  Precautions:  [redacted] weeks pregnant.   Social/Family/Vocational History:   Not working  Recent Procedures/Tests/Findings:  none  Obstetrical History: G2P1, 1 c-section.   Gynecological History: none  Urinary History: No issue  Gastrointestinal History: Having a BM daily, normally not straining unless rushing.  Sexual activity/pain: No pain  Location of pain: lower abdomen Current pain:  0/10  Max pain:  3/10 Least pain:  0/10 Nature of pain: pressure  Patient Goals: Be able to sit on the floor to nurse her daughter for 30 min. Or greater without having discomfort/pressure.    OBJECTIVE  Posture/Observations:  Sitting:  Standing: 5 foot tall, RLE ER'ed, L hip high, hyperkyphosis/lordosis. L PSIS high  Palpation/Segmental Motion/Joint Play: TTP to B iliacus, pectineus, R Psoas  Special tests:   Scoliosis: possible mild R thoracolumbar curve. Supine-to-long-sit: LLE long in supine, level in sitting. LLD: legs measured even but bay not be accurate due to habitus and pelvic obliquity present.   Range of Motion/Flexibilty:  Spine: R  ROT ~ 25% reduced compared to L, SB WNL but tightness B. Hips:   Strength/MMT:  Deferred to follow-up LE MMT  LE MMT Left Right  Hip flex:  (L2) /5 /5  Hip ext: /5 /5  Hip abd: /5 /5  Hip add: /5 /5  Hip IR /5 /5  Hip ER /5 /5     Abdominal:  Palpation: TTP to L>R Iliacus and pectineus, R Psoas.  Diastasis: 2 fingers near umbilicus  Pelvic Floor External Exam: Deferred to follow up PRN Introitus Appears:  Skin integrity:  Palpation: Cough: Prolapse visible?: Scar mobility:  Internal Vaginal  Exam: Strength (PERF):  Symmetry: Palpation: Prolapse:   Internal Rectal Exam: Strength (PERF): Symmetry: Palpation: Prolapse:   Gait Analysis: Deferred to follow up    Pelvic Floor Outcome Measures: FOTO PFDI pain: 4  INTERVENTIONS THIS SESSION: Self-care: Educated on the structure and function of the pelvic floor in relation to their symptoms as well as the POC, and initial HEP in order to set patient expectations and understanding from which we will build on in the future sessions. Educated on how to perform self c-section mobility and diaphragmatic breathing to allow for success with follow-up treatment and decrease tension on pelvic nerves to allow for maximal function of PFM.  Total time: 46 min.                    Objective measurements completed on examination: See above findings.                PT Short Term Goals - 09/27/19 1204      PT SHORT TERM GOAL #1   Title  Patient will demonstrate functional recruitment of TA with breathing, sit-to-stand, squatting/lifting, and walking to allow for improved pelvic brace coordination, improved balance, and decreased downward pressure on the pelvic organs.    Baseline  Pt. reports increased POP sx. when she bends forward or sits on the ground to breastfeed, does not demostrate understanding of precautions for POP    Time  5    Period  Weeks    Status  New    Target Date  11/01/19      PT SHORT TERM GOAL #2   Title  Patient will demonstrate improved pelvic alignment and balance of musculature surrounding the pelvis to facilitate decreased PFM spasms and decrease pelvic pain.    Baseline  L anterior innominate rotation.    Time  5    Period  Weeks    Status  New    Target Date  11/01/19        PT Long Term Goals - 09/27/19 1213      PT LONG TERM GOAL #1   Title  Pt. will be able to sit comfortably for >30 min. in order to breastfeed and will not have increased POP pressure or discomfort  while sitting or upon standing.    Baseline  Pt. having a lot of uncomfortable pressure in the lower abdomen after sitting on the floor/breastfeeding or sitting anywhere >30 min.    Time  10    Period  Weeks    Status  New    Target Date  12/06/19      PT LONG TERM GOAL #2   Title  Pt. will improve in FOTO score by 4 points to demonstrate improved function.    Baseline  FOTO PFDI pain: 4    Time  10    Period  Weeks  Status  New    Target Date  12/06/19      PT LONG TERM GOAL #3   Title  Patient will demonstrate improved sitting and standing posture to demonstrate learning and decrease stress on the pelvic floor with functional activity.    Baseline  anterior pelvic tilt/hyperlordosis in sitting and standing    Time  10    Period  Weeks    Status  New    Target Date  12/06/19             Plan - 09/27/19 1156    Clinical Impression Statement  Pt. is a 29 y/o female who presents today with cheif c/o pelvic pressure during pregnancy. Her PMH is significant for obesity, asthma, and a prior c-section. Her clinical assessment revealed a L anterior rotation and hyperlordosis/kyphosis as well as a 2 finger diastasis and decreased c-section scar mobility. She will benefit from skilled pelvic health PT to address the noted deficits and to continue to assess for and address other potential causes of Sx.    Personal Factors and Comorbidities  Comorbidity 2    Comorbidities  obesity, asthma    Examination-Activity Limitations  Lift;Bend    Examination-Participation Restrictions  Laundry;Cleaning    Stability/Clinical Decision Making  Stable/Uncomplicated    Clinical Decision Making  Low    Rehab Potential  Good    PT Frequency  1x / week    PT Duration  Other (comment)   10 weeks   PT Treatment/Interventions  ADLs/Self Care Home Management;Biofeedback;Moist Heat;Ultrasound;Gait training;Functional mobility training;Therapeutic activities;Neuromuscular re-education;Balance  training;Therapeutic exercise;Patient/family education;Orthotic Fit/Training;Scar mobilization;Manual techniques;Passive range of motion;Dry needling;Taping;Spinal Manipulations;Joint Manipulations    PT Next Visit Plan  TP release and L anterior innominate rotation correction, deep-core recruitment, education on soda-can theory and body mechanics. Further scar mobility/cupping.    PT Home Exercise Plan  diaphragmatic breathing, self c-section scar release.    Consulted and Agree with Plan of Care  Patient       Patient will benefit from skilled therapeutic intervention in order to improve the following deficits and impairments:  Obesity, Improper body mechanics, Decreased scar mobility, Decreased coordination, Decreased strength, Increased fascial restricitons, Postural dysfunction  Visit Diagnosis: Abnormal posture  Sacrococcygeal disorders, not elsewhere classified  Other muscle spasm     Problem List Patient Active Problem List   Diagnosis Date Noted  . Patient desires vaginal birth after cesarean section (VBAC) 08/23/2019  . Pelvic pressure in pregnancy 08/23/2019  . Vitamin D deficiency 07/27/2019  . Elevated prolactin level 11/05/2018  . History of low transverse cesarean section 09/25/2017  . OBESITY, NOS 06/19/2006  . RHINITIS, ALLERGIC 06/19/2006  . ASTHMA, EXERCISE INDUCED 06/19/2006   Willa Rough DPT, ATC Willa Rough 09/27/2019, 12:18 PM  [place] MAIN [facility] SERVICES [address] [place], [place], [address] Phone: [phone]   Fax:  [phone]  Name: Natalie Beck MRN: [id number] Date of Birth: 05-31-1990

## 2019-09-27 NOTE — Patient Instructions (Signed)
   Lay your hand flat with your pinky-side along your scar and scoop gently up toward your head pulling just the skin until you feel a slight discomfort/burning and take deep breaths for at least 2 min. (~ one song) Or until you feel the discomfort release.   Do this daily until you no longer feel discomfort with moderate tension in different directions.   Stabilization: Diaphragmatic Breathing    Lie with knees bent, feet flat. Place one hand on stomach, other on chest. Breathe deeply through nose, lifting belly hand without any motion of hand on chest. Do this for at least 5 min. Per night and throughout the day if you can remember. As it gets easier, try in sitting.

## 2019-10-04 ENCOUNTER — Other Ambulatory Visit: Payer: Self-pay

## 2019-10-04 ENCOUNTER — Ambulatory Visit: Payer: Medicaid Other

## 2019-10-04 DIAGNOSIS — R293 Abnormal posture: Secondary | ICD-10-CM | POA: Diagnosis not present

## 2019-10-04 DIAGNOSIS — M62838 Other muscle spasm: Secondary | ICD-10-CM

## 2019-10-04 DIAGNOSIS — M533 Sacrococcygeal disorders, not elsewhere classified: Secondary | ICD-10-CM

## 2019-10-04 NOTE — Therapy (Signed)
[place] MAIN [facility] SERVICES [address] [place], [place], [address] Phone: [phone]   Fax:  [phone]  Physical Therapy Treatment  The patient has been informed of current processes in place at Outpatient Rehab to protect patients from Covid-19 exposure including social distancing, schedule modifications, and new cleaning procedures. After discussing their particular risk with a therapist based on the patient's personal risk factors, the patient has decided to proceed with in-person therapy.   Patient Details  Name: Natalie Beck MRN: [id number] Date of Birth: 1990/11/04 No data recorded  Encounter Date: 10/04/2019   PT End of Session - 10/06/19 1316    Visit Number 2    Number of Visits 10    Date for PT Re-Evaluation 12/06/19    Authorization Type mcaid    Authorization Time Period 09/27/19 through 10/25/19    Authorization - Visit Number 2    Authorization - Number of Visits 4    Progress Note Due on Visit 10    PT Start Time 8453    PT Stop Time 1704    PT Time Calculation (min) 58 min    Activity Tolerance Patient tolerated treatment well;No increased pain    Behavior During Therapy WFL for tasks assessed/performed           Past Medical History:  Diagnosis Date  . Anemia   . Asthma     Past Surgical History:  Procedure Laterality Date  . CESAREAN SECTION N/A 08/17/2017   Procedure: CESAREAN SECTION;  Surgeon: Harlin Heys, MD;  Location: [facility] ORS;  Service: Obstetrics;  Laterality: N/A;  . dental implant    . WISDOM TOOTH EXTRACTION      There were no vitals filed for this visit.   Pelvic Floor Physical Therapy Treatment Note  SCREENING  Changes in medications, allergies, or medical history?: none    SUBJECTIVE  Patient reports: Started feeling a pinch in the mid-back when she breastfeeds and she has been having trouble with lactation for ~ 1 year now, her LC thinks that the milk is "backed up" and  she feels discomfort in her groin and armpit region. Doing the scar mobility daily but feels like she is starting over each day with progress. Still has resistance with doing belly breathing.   Precautions:  [redacted] weeks pregnant.   Location of pain: lower abdomen Current pain: 0/10  Max pain: 3/10 Least pain: 0/10 Nature of pain:pressure  ** no increase pain following session  Patient Goals: Be able to sit on the floor to nurse her daughter for 30 min. Or greater without having discomfort/pressure.    OBJECTIVE  Changes in: Posture/Observations:  L anterior rotation  Range of Motion/Flexibilty:    Strength/MMT:  LE MMT:  Pelvic floor:  Abdominal:   Palpation: TTP L iliacus and QL   Gait Analysis:  INTERVENTIONS THIS SESSION: Self-care: Discussed different options for seated posture when breastfeeding to help improve pelvic position and prevent increased pressure on the PFM to decrease POP sensation upon standing. Educated on soda-can theory, sit-to-stand, and supine-to-sit transfers to minimize intraabdominal pressure to help protect her diastasis  Recti and POP.   Manual: Performed TP release to  L iliacus and QL followed by self MET with R heel-press and plop x2  to decrease spasm and pain and allow for improved balance of musculature and pelvic alignment for improved function and decreased symptoms.  Therex: educated on and practiced side-stretch, proposal-pose hip-flexor stretch, and LB stretch in  sitting and standing To maintain and improve muscle length and allow for improved balance of musculature for long-term symptom relief.  Total time: 60 min.                              PT Short Term Goals - 09/27/19 1204      PT SHORT TERM GOAL #1   Title Patient will demonstrate functional recruitment of TA with breathing, sit-to-stand, squatting/lifting, and walking to allow for improved pelvic brace coordination, improved balance, and  decreased downward pressure on the pelvic organs.    Baseline Pt. reports increased POP sx. when she bends forward or sits on the ground to breastfeed, does not demostrate understanding of precautions for POP    Time 5    Period Weeks    Status New    Target Date 11/01/19      PT SHORT TERM GOAL #2   Title Patient will demonstrate improved pelvic alignment and balance of musculature surrounding the pelvis to facilitate decreased PFM spasms and decrease pelvic pain.    Baseline L anterior innominate rotation.    Time 5    Period Weeks    Status New    Target Date 11/01/19             PT Long Term Goals - 09/27/19 1213      PT LONG TERM GOAL #1   Title Pt. will be able to sit comfortably for >30 min. in order to breastfeed and will not have increased POP pressure or discomfort while sitting or upon standing.    Baseline Pt. having a lot of uncomfortable pressure in the lower abdomen after sitting on the floor/breastfeeding or sitting anywhere >30 min.    Time 10    Period Weeks    Status New    Target Date 12/06/19      PT LONG TERM GOAL #2   Title Pt. will improve in FOTO score by 4 points to demonstrate improved function.    Baseline FOTO PFDI pain: 4    Time 10    Period Weeks    Status New    Target Date 12/06/19      PT LONG TERM GOAL #3   Title Patient will demonstrate improved sitting and standing posture to demonstrate learning and decrease stress on the pelvic floor with functional activity.    Baseline anterior pelvic tilt/hyperlordosis in sitting and standing    Time 10    Period Weeks    Status New    Target Date 12/06/19                 Plan - 10/06/19 1316    Clinical Impression Statement Pt. Responded well to all interventions today, demonstrating improved pelvic alignment, decreased spasms,  as well as understanding and correct performance of all education and exercises provided today. They will continue to benefit from skilled physical therapy to  work toward remaining goals and maximize function as well as decrease likelihood of symptom increase or recurrence.    PT Next Visit Plan re-assess pelvic alignment, Teach deep-core recruitment, Further scar mobility/cupping, release to LB.    PT Home Exercise Plan diaphragmatic breathing, self c-section scar release, soda-can, sit-to-stand, supine-to-sit, side-stretch, hip-flexor stretch, LB stretch.    Consulted and Agree with Plan of Care Patient           Patient will benefit from skilled therapeutic intervention in order to improve the  following deficits and impairments:     Visit Diagnosis: Abnormal posture  Sacrococcygeal disorders, not elsewhere classified  Other muscle spasm     Problem List Patient Active Problem List   Diagnosis Date Noted  . Patient desires vaginal birth after cesarean section (VBAC) 08/23/2019  . Pelvic pressure in pregnancy 08/23/2019  . Vitamin D deficiency 07/27/2019  . Elevated prolactin level 11/05/2018  . History of low transverse cesarean section 09/25/2017  . OBESITY, NOS 06/19/2006  . RHINITIS, ALLERGIC 06/19/2006  . ASTHMA, EXERCISE INDUCED 06/19/2006   Willa Rough DPT, ATC Willa Rough 10/06/2019, 1:18 PM  [place] [facility] MAIN [facility] SERVICES [address] [place], [place], [address] Phone: [phone]   Fax:  [phone]  Name: IYONA PEHRSON MRN: [id number] Date of Birth: 1990-09-17

## 2019-10-04 NOTE — Patient Instructions (Addendum)
   Sit, feet flat, scoot forward to the edge of the chair. Inhale as you bend forward at hips, begin to exhale just before and while you stand, contracting the glutes, lower tummy muscles and pelvic floor as if stopping urination as you stand up.   * Do this every time you sit or stand! If you catch yourself doing it "wrong, re-set and do it again so it can become habit!    Getting In/out of bed     Lying on back, bend left knee and place left arm across chest. Roll all in one movement to the right. Reverse to roll to the left. Always move as one unit.     Once you are lying on you side, move legs to edge of bed. Pull in the pelvic floor and lower tummy and push down with both hands while moving legs off bed to reach sitting position.  *Reverse sequence to return to lying down.  Copyright  VHI. All rights reserved.   Flexors, Lunge  Hip Flexor Stretch: Proposal Pose    Maintain pelvic tuck under, lift pubic bone toward navel. Engage posterior hip muscles (firm glute muscles of leg in back position) and shift forward until you feel stretch on front of leg that is down. To increase stretch, maintain balance and ease hips forward. You may use one hand on a chair for balance if needed. Hold for __5__ breaths. Repeat __2-3__ times each leg.  Do _1-2__ times per day.    Hold for 30 seconds (5 deep breaths) and repeat 2-3 times on each side once a day    Tuck your hips under, then keep the tuck as you lean forward so you feel a stretch through your low back. Hold for 5 deep breaths, repeat 2-3 times, 1-2 times per day.

## 2019-10-14 ENCOUNTER — Ambulatory Visit: Payer: Medicaid Other

## 2019-10-14 ENCOUNTER — Other Ambulatory Visit: Payer: Self-pay

## 2019-10-14 DIAGNOSIS — M62838 Other muscle spasm: Secondary | ICD-10-CM

## 2019-10-14 DIAGNOSIS — M533 Sacrococcygeal disorders, not elsewhere classified: Secondary | ICD-10-CM

## 2019-10-14 DIAGNOSIS — R293 Abnormal posture: Secondary | ICD-10-CM

## 2019-10-14 NOTE — Patient Instructions (Signed)
Pelvic Tilt With Pelvic Floor (Hook-Lying)        Lie with hips and knees bent. Draw the belly button in/flatten low back while breathing OUT so that pelvis tilts, at the end of the exhale feel the ribcage pull in to the middle a little, inhale as you relax the muscles. Repeat _2x15__ times. Do _1-2__ times a day.    Do 2 sets of 15 tilts per day. Breathe in when you tilt forward (A) and out when you tuck under (B).  Pillows for support when sleeping during pregnancy

## 2019-10-14 NOTE — Therapy (Signed)
[place] MAIN [facility] SERVICES [address] [place], [place], [address] Phone: [phone]   Fax:  [phone]  Physical Therapy Treatment  The patient has been informed of current processes in place at Outpatient Rehab to protect patients from Covid-19 exposure including social distancing, schedule modifications, and new cleaning procedures. After discussing their particular risk with a therapist based on the patient's personal risk factors, the patient has decided to proceed with in-person therapy.  Patient Details  Name: Natalie Beck MRN: [id number] Date of Birth: 05-01-1990 No data recorded  Encounter Date: 10/14/2019   PT End of Session - 10/14/19 1317    Visit Number 3    Number of Visits 10    Date for PT Re-Evaluation 12/06/19    Authorization Type mcaid    Authorization Time Period 09/27/19 through 10/25/19    Authorization - Visit Number 3    Authorization - Number of Visits 4    Progress Note Due on Visit 10    PT Start Time 1115    PT Stop Time 1200    PT Time Calculation (min) 45 min    Activity Tolerance Patient tolerated treatment well;No increased pain    Behavior During Therapy [facility] for tasks assessed/performed           Past Medical History:  Diagnosis Date   Anemia    Asthma     Past Surgical History:  Procedure Laterality Date   CESAREAN SECTION N/A 08/17/2017   Procedure: CESAREAN SECTION;  Surgeon: Harlin Heys, MD;  Location: [facility] ORS;  Service: Obstetrics;  Laterality: N/A;   dental implant     WISDOM TOOTH EXTRACTION      There were no vitals filed for this visit.    Pelvic Floor Physical Therapy Treatment Note  SCREENING  Changes in medications, allergies, or medical history?: none    SUBJECTIVE  Patient reports: Was doing well until Saturday, she has some pain in the R side/back she has been trying to "work out" went to [employer] on the weekend and started noticing it after that,  was uncomfortable on the ride home. She has just stopped nursing now for 3 days. She can feel that it feels better after the let-down. Older child is 26 months. It is going pretty well.   Has been having numbness in her hands for a long time (~ 1 year) notices that it is worst when she is internally rotated at the shoulder.  Precautions:  [redacted] weeks pregnant.   Location of pain: lower abdomen Current pain: 3/10  Max pain: 3/10 Least pain: 0/10 Nature of pain:pressure  ** no pain following session  Patient Goals: Be able to sit on the floor to nurse her daughter for 30 min. Or greater without having discomfort/pressure.    OBJECTIVE  Changes in: Posture/Observations:  L PSIS high in standing, shoulders slightly rounded, hyperkyphosis. Supine-to-long-sit: L anterior innominate rotation.   Range of Motion/Flexibilty:   Strength/MMT:  LE MMT:  Pelvic floor:  Abdominal:  Able to recruit TA and obliques with MIN TC, VC for posterior pelvic tilt in supine.  Palpation: TTP to R QL, Glute Med.  Gait Analysis:  INTERVENTIONS THIS SESSION: Self-care: Educated on belly-bands to help decrease strain on the LB and educated on positioning for sleeping to help minimize rotational forces and prevent return of pelvic obliquity and pain.   Manual: Performed TP release to  R QL, Glute Med. followed by MET correction for L anterior  rotation and "plops" X3  to decrease spasm and pain and allow for improved balance of musculature and pelvic alignment for improved function and decreased symptoms.  Therex: educated on and practiced posterior pelvic tilts with TA and oblique recruitment in hook-lying and sitting to improve strength of muscles opposing tight musculature to allow reciprocal inhibition to improve balance of musculature surrounding the pelvis and improve overall posture for optimal musculature length-tension relationship and function.  Total time: 45  min.                             PT Short Term Goals - 09/27/19 1204      PT SHORT TERM GOAL #1   Title Patient will demonstrate functional recruitment of TA with breathing, sit-to-stand, squatting/lifting, and walking to allow for improved pelvic brace coordination, improved balance, and decreased downward pressure on the pelvic organs.    Baseline Pt. reports increased POP sx. when she bends forward or sits on the ground to breastfeed, does not demostrate understanding of precautions for POP    Time 5    Period Weeks    Status New    Target Date 11/01/19      PT SHORT TERM GOAL #2   Title Patient will demonstrate improved pelvic alignment and balance of musculature surrounding the pelvis to facilitate decreased PFM spasms and decrease pelvic pain.    Baseline L anterior innominate rotation.    Time 5    Period Weeks    Status New    Target Date 11/01/19             PT Long Term Goals - 09/27/19 1213      PT LONG TERM GOAL #1   Title Pt. will be able to sit comfortably for >30 min. in order to breastfeed and will not have increased POP pressure or discomfort while sitting or upon standing.    Baseline Pt. having a lot of uncomfortable pressure in the lower abdomen after sitting on the floor/breastfeeding or sitting anywhere >30 min.    Time 10    Period Weeks    Status New    Target Date 12/06/19      PT LONG TERM GOAL #2   Title Pt. will improve in FOTO score by 4 points to demonstrate improved function.    Baseline FOTO PFDI pain: 4    Time 10    Period Weeks    Status New    Target Date 12/06/19      PT LONG TERM GOAL #3   Title Patient will demonstrate improved sitting and standing posture to demonstrate learning and decrease stress on the pelvic floor with functional activity.    Baseline anterior pelvic tilt/hyperlordosis in sitting and standing    Time 10    Period Weeks    Status New    Target Date 12/06/19                  Plan - 10/14/19 1318    Clinical Impression Statement Pt. Responded well to all interventions today, demonstrating improved pelvic alignment, resom]lution of pain, as well as understanding and correct performance of all education and exercises provided today. They will continue to benefit from skilled physical therapy to work toward remaining goals and maximize function as well as decrease likelihood of symptom increase or recurrence.    PT Next Visit Plan re-assess pelvic alignment and goals, review deep-core recruitment, teach squats, Further scar  mobility/cupping PRN, release to LB.    PT Home Exercise Plan diaphragmatic breathing, self c-section scar release, soda-can, sit-to-stand, supine-to-sit, side-stretch, hip-flexor stretch, LB stretch, belly-band, pelvic tilts in seated and standing.    Consulted and Agree with Plan of Care Patient           Patient will benefit from skilled therapeutic intervention in order to improve the following deficits and impairments:     Visit Diagnosis: Abnormal posture  Sacrococcygeal disorders, not elsewhere classified  Other muscle spasm     Problem List Patient Active Problem List   Diagnosis Date Noted   Patient desires vaginal birth after cesarean section (VBAC) 08/23/2019   Pelvic pressure in pregnancy 08/23/2019   Vitamin D deficiency 07/27/2019   Elevated prolactin level 11/05/2018   History of low transverse cesarean section 09/25/2017   OBESITY, NOS 06/19/2006   RHINITIS, ALLERGIC 06/19/2006   ASTHMA, EXERCISE INDUCED 06/19/2006   Willa Rough DPT, ATC Willa Rough 10/14/2019, 1:25 PM  [place] MAIN [facility] SERVICES [address] [place], [place], [address] Phone: [phone]   Fax:  [phone]  Name: LYVIA MONDESIR MRN: [id number] Date of Birth: 1991-02-18

## 2019-10-18 ENCOUNTER — Ambulatory Visit: Payer: Medicaid Other

## 2019-10-22 ENCOUNTER — Other Ambulatory Visit: Payer: Medicaid Other

## 2019-10-22 ENCOUNTER — Other Ambulatory Visit: Payer: Self-pay

## 2019-10-22 ENCOUNTER — Ambulatory Visit (INDEPENDENT_AMBULATORY_CARE_PROVIDER_SITE_OTHER): Payer: Medicaid Other | Admitting: Certified Nurse Midwife

## 2019-10-22 VITALS — BP 96/54 | HR 81 | Wt 242.3 lb

## 2019-10-22 DIAGNOSIS — Z3492 Encounter for supervision of normal pregnancy, unspecified, second trimester: Secondary | ICD-10-CM

## 2019-10-22 DIAGNOSIS — O34219 Maternal care for unspecified type scar from previous cesarean delivery: Secondary | ICD-10-CM

## 2019-10-22 DIAGNOSIS — Z862 Personal history of diseases of the blood and blood-forming organs and certain disorders involving the immune mechanism: Secondary | ICD-10-CM

## 2019-10-22 DIAGNOSIS — Z98891 History of uterine scar from previous surgery: Secondary | ICD-10-CM

## 2019-10-22 DIAGNOSIS — Z131 Encounter for screening for diabetes mellitus: Secondary | ICD-10-CM

## 2019-10-22 DIAGNOSIS — Z3A26 26 weeks gestation of pregnancy: Secondary | ICD-10-CM

## 2019-10-22 LAB — POCT URINALYSIS DIPSTICK OB
Bilirubin, UA: NEGATIVE
Blood, UA: NEGATIVE
Glucose, UA: NEGATIVE
Ketones, UA: NEGATIVE
Leukocytes, UA: NEGATIVE
Nitrite, UA: NEGATIVE
POC,PROTEIN,UA: NEGATIVE
Spec Grav, UA: 1.01 (ref 1.010–1.025)
Urobilinogen, UA: 0.2 E.U./dL
pH, UA: 5 (ref 5.0–8.0)

## 2019-10-22 MED ORDER — TETANUS-DIPHTH-ACELL PERTUSSIS 5-2.5-18.5 LF-MCG/0.5 IM SUSP: 0.5000 mL | Freq: Once | INTRAMUSCULAR | Status: DC

## 2019-10-22 NOTE — Patient Instructions (Signed)
Fetal Movement Counts Patient Name: ________________________________________________ Patient Due Date: ____________________ What is a fetal movement count?  A fetal movement count is the number of times that you feel your baby move during a certain amount of time. This may also be called a fetal kick count. A fetal movement count is recommended for every pregnant woman. You may be asked to start counting fetal movements as early as week 28 of your pregnancy. Pay attention to when your baby is most active. You may notice your baby's sleep and wake cycles. You may also notice things that make your baby move more. You should do a fetal movement count:  When your baby is normally most active.  At the same time each day. A good time to count movements is while you are resting, after having something to eat and drink. How do I count fetal movements? 1. Find a quiet, comfortable area. Sit, or lie down on your side. 2. Write down the date, the start time and stop time, and the number of movements that you felt between those two times. Take this information with you to your health care visits. 3. Write down your start time when you feel the first movement. 4. Count kicks, flutters, swishes, rolls, and jabs. You should feel at least 10 movements. 5. You may stop counting after you have felt 10 movements, or if you have been counting for 2 hours. Write down the stop time. 6. If you do not feel 10 movements in 2 hours, contact your health care provider for further instructions. Your health care provider may want to do additional tests to assess your baby's well-being. Contact a health care provider if:  You feel fewer than 10 movements in 2 hours.  Your baby is not moving like he or she usually does. Date: ____________ Start time: ____________ Stop time: ____________ Movements: ____________ Date: ____________ Start time: ____________ Stop time: ____________ Movements: ____________ Date: ____________  Start time: ____________ Stop time: ____________ Movements: ____________ Date: ____________ Start time: ____________ Stop time: ____________ Movements: ____________ Date: ____________ Start time: ____________ Stop time: ____________ Movements: ____________ Date: ____________ Start time: ____________ Stop time: ____________ Movements: ____________ Date: ____________ Start time: ____________ Stop time: ____________ Movements: ____________ Date: ____________ Start time: ____________ Stop time: ____________ Movements: ____________ Date: ____________ Start time: ____________ Stop time: ____________ Movements: ____________ This information is not intended to replace advice given to you by your health care provider. Make sure you discuss any questions you have with your health care provider. Document Revised: 11/26/2018 Document Reviewed: 11/26/2018 Elsevier Patient Education  2020 [employer] Inc.  

## 2019-10-23 LAB — CBC
Hematocrit: 34.9 % (ref 34.0–46.6)
Hemoglobin: 11.5 g/dL (ref 11.1–15.9)
MCH: 31.3 pg (ref 26.6–33.0)
MCHC: 33 g/dL (ref 31.5–35.7)
MCV: 95 fL (ref 79–97)
Platelets: 167 10*3/uL (ref 150–450)
RBC: 3.67 x10E6/uL — ABNORMAL LOW (ref 3.77–5.28)
RDW: 14.5 % (ref 11.7–15.4)
WBC: 10.5 10*3/uL (ref 3.4–10.8)

## 2019-10-23 LAB — GLUCOSE, 1 HOUR GESTATIONAL: Gestational Diabetes Screen: 92 mg/dL (ref 65–139)

## 2019-10-23 LAB — RPR: RPR Ser Ql: NONREACTIVE

## 2019-10-26 MED ORDER — CETIRIZINE HCL 10 MG PO TABS: 10.0000 mg | ORAL_TABLET | Freq: Every day | ORAL | 2 refills | Status: DC

## 2019-10-26 NOTE — Progress Notes (Addendum)
ROB-Doing well, stopped breastfeeding approximately two (2) weeks ago. Pumping twice daily to alleviate fullness. Rx Zyrtec, see orders. Patient concerned for lymphedema, will discuss referral options with MD. Third trimester handouts given. 28 week labs today, see orders. TDaP given. Blood transfusion consent reviewed and signed. Enjoying PT with Delfin Edis. Anticipatory guidance regarding course of prenatal care. Reviewed red flag symptoms and when to call. RTC x 2 weeks for ROB or sooner if needed.

## 2019-10-28 ENCOUNTER — Ambulatory Visit: Payer: Medicaid Other

## 2019-11-01 ENCOUNTER — Other Ambulatory Visit: Payer: Self-pay

## 2019-11-01 ENCOUNTER — Ambulatory Visit: Payer: Medicaid Other | Attending: Certified Nurse Midwife

## 2019-11-01 DIAGNOSIS — R293 Abnormal posture: Secondary | ICD-10-CM | POA: Insufficient documentation

## 2019-11-01 DIAGNOSIS — M533 Sacrococcygeal disorders, not elsewhere classified: Secondary | ICD-10-CM | POA: Diagnosis not present

## 2019-11-01 DIAGNOSIS — M62838 Other muscle spasm: Secondary | ICD-10-CM

## 2019-11-01 NOTE — Patient Instructions (Signed)
°  Start with the exercises first for minor discomfort/swelling, then try the massage (see hand-drawn figure).  Do at least 1 time per day, more if needed. Use your "discomfort-meter" from 0-10 to help you determine what is necessary.   Try to do breathing part while lying flat, arms and legs massage can be in reclined position if performing on yourself.  FYI if needed later:

## 2019-11-01 NOTE — Therapy (Signed)
[place] MAIN [facility] SERVICES [address] [place], [place], [address] Phone: [phone]   Fax:  [phone]  Physical Therapy Treatment  The patient has been informed of current processes in place at Outpatient Rehab to protect patients from Covid-19 exposure including social distancing, schedule modifications, and new cleaning procedures. After discussing their particular risk with a therapist based on the patient's personal risk factors, the patient has decided to proceed with in-person therapy.  Patient Details  Name: Natalie Beck MRN: [id number] Date of Birth: May 12, 1990 No data recorded  Encounter Date: 11/01/2019   PT End of Session - 11/01/19 1633    Visit Number 4    Number of Visits 10    Date for PT Re-Evaluation 12/06/19    Authorization Type mcaid    Authorization Time Period 09/27/19 through 10/25/19    Authorization - Visit Number 4    Authorization - Number of Visits 4    Progress Note Due on Visit 10    PT Start Time 1300    PT Stop Time 1400    PT Time Calculation (min) 60 min    Activity Tolerance Patient tolerated treatment well;No increased pain    Behavior During Therapy WFL for tasks assessed/performed           Past Medical History:  Diagnosis Date  . Anemia   . Asthma     Past Surgical History:  Procedure Laterality Date  . CESAREAN SECTION N/A 08/17/2017   Procedure: CESAREAN SECTION;  Surgeon: Harlin Heys, MD;  Location: [facility] ORS;  Service: Obstetrics;  Laterality: N/A;  . dental implant    . WISDOM TOOTH EXTRACTION      There were no vitals filed for this visit.    Pelvic Floor Physical Therapy Treatment Note  SCREENING  Changes in medications, allergies, or medical history?: none    SUBJECTIVE  Patient reports: Doing pretty well, still waiting on her belly band to arrive. [place] and [place] was a lot of work. The baby still likes to sit really low. The stretches and  exercises have really helped when she has had discomfort. She has done the "plopping" thing 2 times since the last visit because she is having to sleep on the recliner to help reduce the fluid in her neck but feels like it irritates the other parts of her body. She can get the pain to come down using the tools she has.  Has 5/10 "fullness/discomfort" in the areas where there are dense lymphatic nodes/ducts pre-treatment.   **reduced to 2/10 following manual treatment today.  Precautions:  [redacted] weeks pregnant.   Location of pain: lower abdomen Current pain: 0/10  Max pain: 4/10 Least pain: 0/10 Nature of pain:pressure  ** no pain following session  Patient Goals: Be able to sit on the floor to nurse her daughter for 30 min. Or greater without having discomfort/pressure.    OBJECTIVE  Changes in: Posture/Observations:  Hyperkyphosis/lordosis. (obliquity not re-assessed today.  Pt. Is having pain/fullness/discomfort in the areas where lymph-nodes are most prevalent but has more swelling/back-up on the R>L.   Range of Motion/Flexibilty:   Strength/MMT:  LE MMT:  Pelvic floor:  Abdominal:  Able to recruit TA and obliques with MIN TC, VC for posterior pelvic tilt in supine. (from prior note)  Palpation:   Gait Analysis:  INTERVENTIONS THIS SESSION: Self-care: Educated on how the lymphatic system  Works and the principle that it needs to be  cleared from proximal to distal to decrease swelling/pressure. Educated on the potential to use a pelvic floor compression device to help decrease vulvar pain due to increased pooling of fluid in the pelvis and vulva.  Manual: Performed light-pressure superficial massage in a proximal to distal manner with a skin-pull and educated Pt. On how to perform the same massage at home to encourage flow through the lymphatic system and help decrease painful pooling of fluid in the back of the knee, groin, vulva, axilla, neck, head, etc.    Therex: educated on and practiced a sequence of ROM exercises designed to help clear lymphatics and encourage venous return through muscle-pump  to encourage flow through the lymphatic system and help decrease painful pooling of fluid in the back of the knee, groin, vulva, axilla, neck, head, etc.   Total time: 60 min.                             PT Short Term Goals - 11/01/19 1634      PT SHORT TERM GOAL #1   Title Patient will demonstrate functional recruitment of TA with breathing, sit-to-stand, squatting/lifting, and walking to allow for improved pelvic brace coordination, improved balance, and decreased downward pressure on the pelvic organs.    Baseline Pt. reports increased POP sx. when she bends forward or sits on the ground to breastfeed, does not demostrate understanding of precautions for POP    Time 5    Period Weeks    Status Achieved    Target Date 11/01/19      PT SHORT TERM GOAL #2   Title Patient will demonstrate improved pelvic alignment and balance of musculature surrounding the pelvis to facilitate decreased PFM spasms and decrease pelvic pain.    Baseline L anterior innominate rotation.    Time 5    Period Weeks    Status Achieved    Target Date 11/01/19             PT Long Term Goals - 11/01/19 1634      PT LONG TERM GOAL #1   Title Pt. will be able to sit comfortably for >30 min. in order to breastfeed and will not have increased POP pressure or discomfort while sitting or upon standing.    Baseline Pt. having a lot of uncomfortable pressure in the lower abdomen after sitting on the floor/breastfeeding or sitting anywhere >30 min. As of 7/12: Pt. is still having pain/pressure in the vulvar area but it seems to be related more to poor venous retur/lyphatic flow than POP, internal assessment will help determine if PFM spasms are present.    Time 10    Period Weeks    Status On-going    Target Date 01/10/20      PT LONG TERM GOAL  #2   Title Pt. will improve in FOTO score by 4 points to demonstrate improved function.    Baseline FOTO PFDI pain: 4    Time 10    Period Weeks    Status On-going    Target Date 01/10/20      PT LONG TERM GOAL #3   Title Patient will demonstrate improved sitting and standing posture to demonstrate learning and decrease stress on the pelvic floor with functional activity.    Baseline anterior pelvic tilt/hyperlordosis in sitting and standing As of 7/12: Pt. demonstrates improved sitting posture when thinking about it but needs more practice and cueing for standing posture.  Time 10    Period Weeks    Status On-going    Target Date 03/20/20                 Plan - 11/01/19 1654    Clinical Impression Statement Pt. has met or made progress toward all goals and is demonstrating significantly less LB and pelvic pain but continues to have vulvar pain possibly due to varicosities and venous/lyphatic drainage issues (which she has been working with her Science writer on) Vs. PFM spasms. She responded positively today to all treatment, demonstrating a decrease in pressure/discomfort from 5/10 to 2/10 following manual treatment. She will continue to benefit from skilled pelvic health PT to continue to work toward her original goals though determining and treating the cause of her vulvar pain and strengthening the deep-core to help prevent return of spasm/pain and help decrease diastasis/minimize impact over the current pregnancy to prevent long-term health effects.    Personal Factors and Comorbidities Comorbidity 2    Comorbidities obesity, asthma    Examination-Activity Limitations Lift;Bend    Examination-Participation Restrictions Laundry;Cleaning    Stability/Clinical Decision Making Stable/Uncomplicated    Rehab Potential Good    PT Frequency 1x / week    PT Duration Other (comment)   10 weeks   PT Treatment/Interventions ADLs/Self Care Home Management;Biofeedback;Moist  Heat;Ultrasound;Gait training;Functional mobility training;Therapeutic activities;Neuromuscular re-education;Balance training;Therapeutic exercise;Patient/family education;Orthotic Fit/Training;Scar mobilization;Manual techniques;Passive range of motion;Dry needling;Taping;Spinal Manipulations;Joint Manipulations    PT Next Visit Plan Assess internally, MFR to LB, deep-core recruitment, education on soda-can theory and body mechanics. Further scar mobility/cupping.    PT Home Exercise Plan diaphragmatic breathing, self c-section scar release, lymphatic massage and exercises, LB stretch, side-stretch.    Consulted and Agree with Plan of Care Patient           Patient will benefit from skilled therapeutic intervention in order to improve the following deficits and impairments:  Obesity, Improper body mechanics, Decreased scar mobility, Decreased coordination, Decreased strength, Increased fascial restricitons, Postural dysfunction  Visit Diagnosis: Abnormal posture  Sacrococcygeal disorders, not elsewhere classified  Other muscle spasm     Problem List Patient Active Problem List   Diagnosis Date Noted  . Patient desires vaginal birth after cesarean section (VBAC) 08/23/2019  . Pelvic pressure in pregnancy 08/23/2019  . Vitamin D deficiency 07/27/2019  . Elevated prolactin level 11/05/2018  . History of low transverse cesarean section 09/25/2017  . OBESITY, NOS 06/19/2006  . RHINITIS, ALLERGIC 06/19/2006  . ASTHMA, EXERCISE INDUCED 06/19/2006   Willa Rough DPT, ATC Willa Rough 11/01/2019, 5:00 PM  [place] MAIN [facility] SERVICES [address] [place], [place], [address] Phone: [phone]   Fax:  [phone]  Name: DANYLE BOENING MRN: [id number] Date of Birth: [birth year]

## 2019-11-08 ENCOUNTER — Ambulatory Visit: Payer: Medicaid Other

## 2019-11-08 ENCOUNTER — Ambulatory Visit (INDEPENDENT_AMBULATORY_CARE_PROVIDER_SITE_OTHER): Payer: Medicaid Other | Admitting: Certified Nurse Midwife

## 2019-11-08 VITALS — BP 105/61 | HR 87 | Wt 246.3 lb

## 2019-11-08 DIAGNOSIS — Z3492 Encounter for supervision of normal pregnancy, unspecified, second trimester: Secondary | ICD-10-CM

## 2019-11-08 DIAGNOSIS — Z3A26 26 weeks gestation of pregnancy: Secondary | ICD-10-CM

## 2019-11-08 LAB — POCT URINALYSIS DIPSTICK OB
Bilirubin, UA: NEGATIVE
Blood, UA: NEGATIVE
Glucose, UA: NEGATIVE
Ketones, UA: NEGATIVE
Leukocytes, UA: NEGATIVE
Nitrite, UA: NEGATIVE
POC,PROTEIN,UA: NEGATIVE
Spec Grav, UA: 1.015 (ref 1.010–1.025)
Urobilinogen, UA: 0.2 E.U./dL
pH, UA: 8 (ref 5.0–8.0)

## 2019-11-08 NOTE — Patient Instructions (Signed)
Fetal Movement Counts Patient Name: ________________________________________________ Patient Due Date: ____________________ What is a fetal movement count?  A fetal movement count is the number of times that you feel your baby move during a certain amount of time. This may also be called a fetal kick count. A fetal movement count is recommended for every pregnant woman. You may be asked to start counting fetal movements as early as week 28 of your pregnancy. Pay attention to when your baby is most active. You may notice your baby's sleep and wake cycles. You may also notice things that make your baby move more. You should do a fetal movement count:  When your baby is normally most active.  At the same time each day. A good time to count movements is while you are resting, after having something to eat and drink. How do I count fetal movements? 1. Find a quiet, comfortable area. Sit, or lie down on your side. 2. Write down the date, the start time and stop time, and the number of movements that you felt between those two times. Take this information with you to your health care visits. 3. Write down your start time when you feel the first movement. 4. Count kicks, flutters, swishes, rolls, and jabs. You should feel at least 10 movements. 5. You may stop counting after you have felt 10 movements, or if you have been counting for 2 hours. Write down the stop time. 6. If you do not feel 10 movements in 2 hours, contact your health care provider for further instructions. Your health care provider may want to do additional tests to assess your baby's well-being. Contact a health care provider if:  You feel fewer than 10 movements in 2 hours.  Your baby is not moving like he or she usually does. Date: ____________ Start time: ____________ Stop time: ____________ Movements: ____________ Date: ____________ Start time: ____________ Stop time: ____________ Movements: ____________ Date: ____________  Start time: ____________ Stop time: ____________ Movements: ____________ Date: ____________ Start time: ____________ Stop time: ____________ Movements: ____________ Date: ____________ Start time: ____________ Stop time: ____________ Movements: ____________ Date: ____________ Start time: ____________ Stop time: ____________ Movements: ____________ Date: ____________ Start time: ____________ Stop time: ____________ Movements: ____________ Date: ____________ Start time: ____________ Stop time: ____________ Movements: ____________ Date: ____________ Start time: ____________ Stop time: ____________ Movements: ____________ This information is not intended to replace advice given to you by your health care provider. Make sure you discuss any questions you have with your health care provider. Document Revised: 11/26/2018 Document Reviewed: 11/26/2018 Elsevier Patient Education  2020 [employer]   Third Trimester of Pregnancy  The third trimester is from week 28 through week 40 (months 7 through 9). This trimester is when your unborn baby (fetus) is growing very fast. At the end of the ninth month, the unborn baby is about 20 inches in length. It weighs about 6-10 pounds. Follow these instructions at home: Medicines  Take over-the-counter and prescription medicines only as told by your doctor. Some medicines are safe and some medicines are not safe during pregnancy.  Take a prenatal vitamin that contains at least 600 micrograms (mcg) of folic acid.  If you have trouble pooping (constipation), take medicine that will make your stool soft (stool softener) if your doctor approves. Eating and drinking   Eat regular, healthy meals.  Avoid raw meat and uncooked cheese.  If you get low calcium from the food you eat, talk to your doctor about taking a daily calcium supplement.    Eat four or five small meals rather than three large meals a day.  Avoid foods that are high in fat and sugars, such as  fried and sweet foods.  To prevent constipation: ? Eat foods that are high in fiber, like fresh fruits and vegetables, whole grains, and beans. ? Drink enough fluids to keep your pee (urine) clear or pale yellow. Activity  Exercise only as told by your doctor. Stop exercising if you start to have cramps.  Avoid heavy lifting, wear low heels, and sit up straight.  Do not exercise if it is too hot, too humid, or if you are in a place of great height (high altitude).  You may continue to have sex unless your doctor tells you not to. Relieving pain and discomfort  Wear a good support bra if your breasts are tender.  Take frequent breaks and rest with your legs raised if you have leg cramps or low back pain.  Take warm water baths (sitz baths) to soothe pain or discomfort caused by hemorrhoids. Use hemorrhoid cream if your doctor approves.  If you develop puffy, bulging veins (varicose veins) in your legs: ? Wear support hose or compression stockings as told by your doctor. ? Raise (elevate) your feet for 15 minutes, 3-4 times a day. ? Limit salt in your food. Safety  Wear your seat belt when driving.  Make a list of emergency phone numbers, including numbers for family, friends, the hospital, and police and fire departments. Preparing for your baby's arrival To prepare for the arrival of your baby:  Take prenatal classes.  Practice driving to the hospital.  Visit the hospital and tour the maternity area.  Talk to your work about taking leave once the baby comes.  Pack your hospital bag.  Prepare the baby's room.  Go to your doctor visits.  Buy a rear-facing car seat. Learn how to install it in your car. General instructions  Do not use hot tubs, steam rooms, or saunas.  Do not use any products that contain nicotine or tobacco, such as cigarettes and e-cigarettes. If you need help quitting, ask your doctor.  Do not drink alcohol.  Do not douche or use tampons or  scented sanitary pads.  Do not cross your legs for long periods of time.  Do not travel for long distances unless you must. Only do so if your doctor says it is okay.  Visit your dentist if you have not gone during your pregnancy. Use a soft toothbrush to brush your teeth. Be gentle when you floss.  Avoid cat litter boxes and soil used by cats. These carry germs that can cause birth defects in the baby and can cause a loss of your baby (miscarriage) or stillbirth.  Keep all your prenatal visits as told by your doctor. This is important. Contact a doctor if:  You are not sure if you are in labor or if your water has broken.  You are dizzy.  You have mild cramps or pressure in your lower belly.  You have a nagging pain in your belly area.  You continue to feel sick to your stomach, you throw up, or you have watery poop.  You have bad smelling fluid coming from your vagina.  You have pain when you pee. Get help right away if:  You have a fever.  You are leaking fluid from your vagina.  You are spotting or bleeding from your vagina.  You have severe belly cramps or pain.  You   lose or gain weight quickly.  You have trouble catching your breath and have chest pain.  You notice sudden or extreme puffiness (swelling) of your face, hands, ankles, feet, or legs.  You have not felt the baby move in over an hour.  You have severe headaches that do not go away with medicine.  You have trouble seeing.  You are leaking, or you are having a gush of fluid, from your vagina before you are 37 weeks.  You have regular belly spasms (contractions) before you are 37 weeks. Summary  The third trimester is from week 28 through week 40 (months 7 through 9). This time is when your unborn baby is growing very fast.  Follow your doctor's advice about medicine, food, and activity.  Get ready for the arrival of your baby by taking prenatal classes, getting all the baby items ready,  preparing the baby's room, and visiting your doctor to be checked.  Get help right away if you are bleeding from your vagina, or you have chest pain and trouble catching your breath, or if you have not felt your baby move in over an hour. This information is not intended to replace advice given to you by your health care provider. Make sure you discuss any questions you have with your health care provider. Document Revised: 07/30/2018 Document Reviewed: 05/14/2016 Elsevier Patient Education  2020 [employer].

## 2019-11-09 NOTE — Progress Notes (Signed)
ROB-Doing well overall except for headache that started yesterday after baby shower. Discussed home treatment measures. Lymphatic massages are working on generalized swelling and feelings of fullness. Copy of Illustrated Birth Choices given to complete. Advised to schedule Anesthesia consult and message left with [facility]. Anticipatory guidance regarding course of prenatal care and use of growth ultrasounds. Reviewed red flag symptoms and when to call. RTC x 2 weeks for ROB or sooner if needed.

## 2019-11-15 ENCOUNTER — Ambulatory Visit: Payer: Medicaid Other

## 2019-11-15 ENCOUNTER — Other Ambulatory Visit: Payer: Self-pay

## 2019-11-15 DIAGNOSIS — M62838 Other muscle spasm: Secondary | ICD-10-CM

## 2019-11-15 DIAGNOSIS — R293 Abnormal posture: Secondary | ICD-10-CM

## 2019-11-15 DIAGNOSIS — M533 Sacrococcygeal disorders, not elsewhere classified: Secondary | ICD-10-CM | POA: Diagnosis not present

## 2019-11-15 NOTE — Therapy (Signed)
[facility] MAIN [facility] SERVICES [address] [place], [place], [address] Phone: [phone]   Fax:  [phone]  Physical Therapy Treatment  The patient has been informed of current processes in place at Outpatient Rehab to protect patients from Covid-19 exposure including social distancing, schedule modifications, and new cleaning procedures. After discussing their particular risk with a therapist based on the patient's personal risk factors, the patient has decided to proceed with in-person therapy.  Patient Details  Name: Natalie Beck MRN: [id number] Date of Birth: 09/20/1990 No data recorded  Encounter Date: 11/15/2019   PT End of Session - 11/15/19 1516    Visit Number 5    Number of Visits 10    Date for PT Re-Evaluation 12/06/19    Authorization Type mcaid    Authorization Time Period 09/27/19 through 10/25/19    Authorization - Visit Number 5    Authorization - Number of Visits 4    Progress Note Due on Visit 10    PT Start Time 1400    PT Stop Time 1500    PT Time Calculation (min) 60 min    Activity Tolerance Patient tolerated treatment well;No increased pain    Behavior During Therapy [facility] for tasks assessed/performed           Past Medical History:  Diagnosis Date   Anemia    Asthma     Past Surgical History:  Procedure Laterality Date   CESAREAN SECTION N/A 08/17/2017   Procedure: CESAREAN SECTION;  Surgeon: Linzie Collin, MD;  Location: [facility] ORS;  Service: Obstetrics;  Laterality: N/A;   dental implant     WISDOM TOOTH EXTRACTION      There were no vitals filed for this visit.  Pelvic Floor Physical Therapy Treatment Note  SCREENING  Changes in medications, allergies, or medical history?: none    SUBJECTIVE  Patient reports: Got her 3D scan and can tell that the baby is bigger, he has a chunky face already. Feeling pretty good other than the fluid. Not sitting for long periods of time anymore  which seems to have helped a lot. Feels like the baby is really low mostly when she sits for a long time, no pain just pressure. Feels like the massages are going well, she does the massage when she gets out of the shower. Feels like she makes a little progress within the day but when she wakes up the next morning she is "back at square 1". Does the exercises throughout the day. Is most irritated in the vulvar area. Has irritation at the vulva. Knows it is not a yeast infection or BV and can feel the pull in that area when she does the massage and exercises. Can feel almost a burning feeling when the fluid is moving. The belly breathing is very effective for getting things moving.   Has 7/10 "fullness/discomfort" in the areas where there are dense lymphatic nodes/ducts pre-treatment.   **reduced to 3/10 following manual treatment today.  Precautions:  [redacted] weeks pregnant.   Location of pain: lower abdomen Current pain: 0/10  Max pain: 4/10 Least pain: 0/10 Nature of pain:pressure  ** no pain following session  Patient Goals: Be able to sit on the floor to nurse her daughter for 30 min. Or greater without having discomfort/pressure.    OBJECTIVE  Changes in: Posture/Observations:  Hyperkyphosis/lordosis. (obliquity not re-assessed today.  Pt. Is having pain/fullness/discomfort in the areas where lymph-nodes are most prevalent but has more swelling/back-up  on the R>L.   Range of Motion/Flexibilty:   Strength/MMT:  LE MMT:  Pelvic floor: Not assessed directly but Pt. History and description during treatment suggest edema in labia and mons causing increased pain/discomfort.  Abdominal:  Able to recruit TA and obliques with MIN TC, VC for posterior pelvic tilt in supine. (from prior note)  Palpation:  Gait Analysis:  INTERVENTIONS THIS SESSION: Theract: reviewed when and how to perform lymphedema exercises and massage to maximize efficacy, educated on using compression socks  and/or ace wraps to help decrease swelling gin the lower extremities and reinforced the importance of clearing the upper nodes if doing massage rather than just rubbing where the discomfort is.   Manual: Performed light-pressure superficial massage x10 strokes at each location in a proximal to distal manner with a skin-pull and reviewed On how to perform the same massage at home to encourage flow through the lymphatic system and help decrease painful pooling of fluid in the back of the knee, groin, vulva, axilla, neck, head, etc. Answered questions about direction of pull in problematic areas.  Total time: 60 min.                                 PT Short Term Goals - 11/01/19 1634      PT SHORT TERM GOAL #1   Title Patient will demonstrate functional recruitment of TA with breathing, sit-to-stand, squatting/lifting, and walking to allow for improved pelvic brace coordination, improved balance, and decreased downward pressure on the pelvic organs.    Baseline Pt. reports increased POP sx. when she bends forward or sits on the ground to breastfeed, does not demostrate understanding of precautions for POP    Time 5    Period Weeks    Status Achieved    Target Date 11/01/19      PT SHORT TERM GOAL #2   Title Patient will demonstrate improved pelvic alignment and balance of musculature surrounding the pelvis to facilitate decreased PFM spasms and decrease pelvic pain.    Baseline L anterior innominate rotation.    Time 5    Period Weeks    Status Achieved    Target Date 11/01/19             PT Long Term Goals - 11/01/19 1634      PT LONG TERM GOAL #1   Title Pt. will be able to sit comfortably for >30 min. in order to breastfeed and will not have increased POP pressure or discomfort while sitting or upon standing.    Baseline Pt. having a lot of uncomfortable pressure in the lower abdomen after sitting on the floor/breastfeeding or sitting anywhere >30 min.  As of 7/12: Pt. is still having pain/pressure in the vulvar area but it seems to be related more to poor venous retur/lyphatic flow than POP, internal assessment will help determine if PFM spasms are present.    Time 10    Period Weeks    Status On-going    Target Date 01/10/20      PT LONG TERM GOAL #2   Title Pt. will improve in FOTO score by 4 points to demonstrate improved function.    Baseline FOTO PFDI pain: 4    Time 10    Period Weeks    Status On-going    Target Date 01/10/20      PT LONG TERM GOAL #3   Title Patient will demonstrate improved  sitting and standing posture to demonstrate learning and decrease stress on the pelvic floor with functional activity.    Baseline anterior pelvic tilt/hyperlordosis in sitting and standing As of 7/12: Pt. demonstrates improved sitting posture when thinking about it but needs more practice and cueing for standing posture.    Time 10    Period Weeks    Status On-going    Target Date 03/20/20                 Plan - 11/15/19 1516    Clinical Impression Statement Pt. Responded well to all interventions today, demonstrating improved swelling based on Pt. Reported measure of pressure/discomfort decreasing from 7/10 to 3/10, as well as understanding and correct performance of all education and exercises provided today. They will continue to benefit from skilled physical therapy to work toward remaining goals and maximize function as well as decrease likelihood of symptom increase or recurrence.    PT Next Visit Plan Assess internally, MFR to LB, deep-core recruitment, education on soda-can theory and body mechanics. Further scar mobility/cupping.    PT Home Exercise Plan diaphragmatic breathing, self c-section scar release, lymphatic massage and exercises, LB stretch, side-stretch.    Consulted and Agree with Plan of Care Patient           Patient will benefit from skilled therapeutic intervention in order to improve the following  deficits and impairments:     Visit Diagnosis: Abnormal posture  Sacrococcygeal disorders, not elsewhere classified  Other muscle spasm     Problem List Patient Active Problem List   Diagnosis Date Noted   Patient desires vaginal birth after cesarean section (VBAC) 08/23/2019   Pelvic pressure in pregnancy 08/23/2019   Vitamin D deficiency 07/27/2019   Elevated prolactin level 11/05/2018   History of low transverse cesarean section 09/25/2017   OBESITY, NOS 06/19/2006   RHINITIS, ALLERGIC 06/19/2006   ASTHMA, EXERCISE INDUCED 06/19/2006   Cleophus Molt DPT, ATC Cleophus Molt 11/15/2019, 3:24 PM  [facility] MAIN [facility] SERVICES [address] [place], [place], [address] Phone: [phone]   Fax:  [phone]  Name: Natalie Beck MRN: [id number] Date of Birth: 08-Jun-1990

## 2019-11-16 ENCOUNTER — Other Ambulatory Visit: Payer: Medicaid Other | Attending: Certified Nurse Midwife

## 2019-11-22 ENCOUNTER — Ambulatory Visit: Payer: Medicaid Other | Attending: Certified Nurse Midwife

## 2019-11-22 ENCOUNTER — Ambulatory Visit (INDEPENDENT_AMBULATORY_CARE_PROVIDER_SITE_OTHER): Payer: Medicaid Other | Admitting: Certified Nurse Midwife

## 2019-11-22 ENCOUNTER — Other Ambulatory Visit: Payer: Self-pay

## 2019-11-22 ENCOUNTER — Encounter: Payer: Self-pay | Admitting: Certified Nurse Midwife

## 2019-11-22 VITALS — BP 108/62 | HR 77 | Wt 245.0 lb

## 2019-11-22 DIAGNOSIS — M533 Sacrococcygeal disorders, not elsewhere classified: Secondary | ICD-10-CM | POA: Diagnosis present

## 2019-11-22 DIAGNOSIS — O34219 Maternal care for unspecified type scar from previous cesarean delivery: Secondary | ICD-10-CM

## 2019-11-22 DIAGNOSIS — M62838 Other muscle spasm: Secondary | ICD-10-CM

## 2019-11-22 DIAGNOSIS — Z6841 Body Mass Index (BMI) 40.0 and over, adult: Secondary | ICD-10-CM

## 2019-11-22 DIAGNOSIS — Z3493 Encounter for supervision of normal pregnancy, unspecified, third trimester: Secondary | ICD-10-CM | POA: Diagnosis not present

## 2019-11-22 DIAGNOSIS — Z3A30 30 weeks gestation of pregnancy: Secondary | ICD-10-CM

## 2019-11-22 DIAGNOSIS — R293 Abnormal posture: Secondary | ICD-10-CM | POA: Diagnosis present

## 2019-11-22 LAB — POCT URINALYSIS DIPSTICK OB
Bilirubin, UA: NEGATIVE
Glucose, UA: NEGATIVE
Ketones, UA: NEGATIVE
Nitrite, UA: NEGATIVE
POC,PROTEIN,UA: NEGATIVE
Spec Grav, UA: 1.015 (ref 1.010–1.025)
Urobilinogen, UA: 0.2 E.U./dL
pH, UA: 6.5 (ref 5.0–8.0)

## 2019-11-22 NOTE — Patient Instructions (Signed)
  When seated, you want to maintain pelvic neutral with the shoulders gently down and back and ears in line with your shoulders. A lumbar roll such as the one below or a home-made towel-roll can be used for this purpose. Even Olympic athletes can only maintain proper seated posture for about 10 minutes without support!    Pictured: The Original [employer] Lumbar Roll  Pelvic Tilt With Pelvic Floor (Hook-Lying)        Lie with hips and knees bent. Squeeze pelvic floor and flatten low back while breathing out so that pelvis tilts. Repeat _3x10__ times. Do _1-2__ times a day.    Do 2 sets of 15 tilts per day. Breathe in when you tilt forward (A) and out when you tuck under (B).

## 2019-11-22 NOTE — Patient Instructions (Signed)
Fetal Movement Counts Patient Name: ________________________________________________ Patient Due Date: ____________________ What is a fetal movement count?  A fetal movement count is the number of times that you feel your baby move during a certain amount of time. This may also be called a fetal kick count. A fetal movement count is recommended for every pregnant woman. You may be asked to start counting fetal movements as early as week 28 of your pregnancy. Pay attention to when your baby is most active. You may notice your baby's sleep and wake cycles. You may also notice things that make your baby move more. You should do a fetal movement count:  When your baby is normally most active.  At the same time each day. A good time to count movements is while you are resting, after having something to eat and drink. How do I count fetal movements? 1. Find a quiet, comfortable area. Sit, or lie down on your side. 2. Write down the date, the start time and stop time, and the number of movements that you felt between those two times. Take this information with you to your health care visits. 3. Write down your start time when you feel the first movement. 4. Count kicks, flutters, swishes, rolls, and jabs. You should feel at least 10 movements. 5. You may stop counting after you have felt 10 movements, or if you have been counting for 2 hours. Write down the stop time. 6. If you do not feel 10 movements in 2 hours, contact your health care provider for further instructions. Your health care provider may want to do additional tests to assess your baby's well-being. Contact a health care provider if:  You feel fewer than 10 movements in 2 hours.  Your baby is not moving like he or she usually does. Date: ____________ Start time: ____________ Stop time: ____________ Movements: ____________ Date: ____________ Start time: ____________ Stop time: ____________ Movements: ____________ Date: ____________  Start time: ____________ Stop time: ____________ Movements: ____________ Date: ____________ Start time: ____________ Stop time: ____________ Movements: ____________ Date: ____________ Start time: ____________ Stop time: ____________ Movements: ____________ Date: ____________ Start time: ____________ Stop time: ____________ Movements: ____________ Date: ____________ Start time: ____________ Stop time: ____________ Movements: ____________ Date: ____________ Start time: ____________ Stop time: ____________ Movements: ____________ Date: ____________ Start time: ____________ Stop time: ____________ Movements: ____________ This information is not intended to replace advice given to you by your health care provider. Make sure you discuss any questions you have with your health care provider. Document Revised: 11/26/2018 Document Reviewed: 11/26/2018 Elsevier Patient Education  2020 [employer] Inc.  

## 2019-11-22 NOTE — Progress Notes (Signed)
ROB-Doing well. Reports relief with lymphatic massages and pelvic floor physical therapy. Baby Friendly education completed, see checklist. Anesthesia consult rescheduled for Tuesday, August 10 at 1245. Urine dip positive for blood; culture sent, see orders. Anticipatory guidance regarding course of prenatal care. Reviewed red flag symptoms and when to call. RTC x 2 weeks for growth/AFI ultrasound and VBAC consultation with ASC; RTC x 4 weeks for ROB with JML or sooner if needed.

## 2019-11-22 NOTE — Therapy (Signed)
[facility] MAIN [facility] SERVICES [address] [place], [place], [address] Phone: [phone]   Fax:  [phone]  Physical Therapy Treatment  The patient has been informed of current processes in place at Outpatient Rehab to protect patients from Covid-19 exposure including social distancing, schedule modifications, and new cleaning procedures. After discussing their particular risk with a therapist based on the patient's personal risk factors, the patient has decided to proceed with in-person therapy.  Patient Details  Name: Natalie Beck MRN: [id number] Date of Birth: 07-20-1990 No data recorded  Encounter Date: 11/22/2019   PT End of Session - 11/22/19 1636    Visit Number 6    Number of Visits 10    Date for PT Re-Evaluation 12/06/19    Authorization Type mcaid    Authorization Time Period 09/27/19 through 10/25/19    Authorization - Visit Number 6    Authorization - Number of Visits 4    Progress Note Due on Visit 10    PT Start Time 1230    PT Stop Time 1330    PT Time Calculation (min) 60 min    Activity Tolerance Patient tolerated treatment well;No increased pain    Behavior During Therapy WFL for tasks assessed/performed           Past Medical History:  Diagnosis Date  . Anemia   . Asthma     Past Surgical History:  Procedure Laterality Date  . CESAREAN SECTION N/A 08/17/2017   Procedure: CESAREAN SECTION;  Surgeon: Linzie Collin, MD;  Location: [facility] ORS;  Service: Obstetrics;  Laterality: N/A;  . dental implant    . WISDOM TOOTH EXTRACTION      There were no vitals filed for this visit.  Pelvic Floor Physical Therapy Treatment Note  SCREENING  Changes in medications, allergies, or medical history?: none    SUBJECTIVE  Patient reports: She is still having a lot of pressure in the pelvis where the baby is sitting low. Feels like she can feel that the fluid is slowly moving up. Has had her fiance helping her  with the massage. Has ordered her compression socks, they should be here tomorrow. Got a belly band and it is helping with the pressure. Had a lot of pain last night that she couldn't seem to get to ease off. She had a pocket of fluid in the L hip that she was able to move some. It was not as bad this morning.  Has 2/10 "fullness/discomfort" in the areas where there are dense lymphatic nodes/ducts pre-treatment.    Precautions:  [redacted] weeks pregnant.   Location of pain: lower abdomen Current pain: 0/10  Max pain: 8/10 Least pain: 0/10 Nature of pain:pressure  ** no increased pain following session  Patient Goals: Be able to sit on the floor to nurse her daughter for 30 min. Or greater without having discomfort/pressure.    OBJECTIVE  Changes in: Posture/Observations:  Hyperkyphosis/lordosis. PSIS appear level in standing, ankles appear level in supine. Pt. Is having pain/fullness/discomfort in the areas where lymph-nodes are most prevalent but has more swelling/back-up on the R>L.   Range of Motion/Flexibilty:  Decreased hip EXT ROM on R>L pre-treatment,   ** following treatment, greater on R>L  Strength/MMT:  LE MMT:  Pelvic floor: Not assessed directly but Pt. History and description during treatment suggest edema in labia and mons causing increased pain/discomfort.  Abdominal:  Able to recruit TA and obliques with MIN TC, VC for posterior pelvic  tilt in supine. (from prior note)  Palpation: TTP to L Psoas and Iliacus  Gait Analysis:  INTERVENTIONS THIS SESSION: Therex: reviewed  hip-flexor stretch and educated on hook-lying and seated pelvic tilts to decrease hyperlordosis allow for decreased pressure on lumbaar nerve roots as well as increase available space in the inguinal canal for lymphatic and vascular flow.  Self-care: educated on seated posture to decrease lordosis and help prevent return of hip-flexor spasm to allow improved room for lymphatic flow through  the inguinal canal.  Manual: Performed TP release to L Psoas and Iliacus to decrease spasm and pain and allow for improved balance of musculature for improved function and decreased symptoms.  Total time: 60 min.                              PT Short Term Goals - 11/01/19 1634      PT SHORT TERM GOAL #1   Title Patient will demonstrate functional recruitment of TA with breathing, sit-to-stand, squatting/lifting, and walking to allow for improved pelvic brace coordination, improved balance, and decreased downward pressure on the pelvic organs.    Baseline Pt. reports increased POP sx. when she bends forward or sits on the ground to breastfeed, does not demostrate understanding of precautions for POP    Time 5    Period Weeks    Status Achieved    Target Date 11/01/19      PT SHORT TERM GOAL #2   Title Patient will demonstrate improved pelvic alignment and balance of musculature surrounding the pelvis to facilitate decreased PFM spasms and decrease pelvic pain.    Baseline L anterior innominate rotation.    Time 5    Period Weeks    Status Achieved    Target Date 11/01/19             PT Long Term Goals - 11/01/19 1634      PT LONG TERM GOAL #1   Title Pt. will be able to sit comfortably for >30 min. in order to breastfeed and will not have increased POP pressure or discomfort while sitting or upon standing.    Baseline Pt. having a lot of uncomfortable pressure in the lower abdomen after sitting on the floor/breastfeeding or sitting anywhere >30 min. As of 7/12: Pt. is still having pain/pressure in the vulvar area but it seems to be related more to poor venous retur/lyphatic flow than POP, internal assessment will help determine if PFM spasms are present.    Time 10    Period Weeks    Status On-going    Target Date 01/10/20      PT LONG TERM GOAL #2   Title Pt. will improve in FOTO score by 4 points to demonstrate improved function.    Baseline FOTO  PFDI pain: 4    Time 10    Period Weeks    Status On-going    Target Date 01/10/20      PT LONG TERM GOAL #3   Title Patient will demonstrate improved sitting and standing posture to demonstrate learning and decrease stress on the pelvic floor with functional activity.    Baseline anterior pelvic tilt/hyperlordosis in sitting and standing As of 7/12: Pt. demonstrates improved sitting posture when thinking about it but needs more practice and cueing for standing posture.    Time 10    Period Weeks    Status On-going    Target Date 03/20/20  Plan - 11/22/19 1638    Clinical Impression Statement Pt. Responded well to all interventions today, demonstrating improved hip EXT ROM, improved seated posture, as well as understanding and correct performance of all education and exercises provided today. They will continue to benefit from skilled physical therapy to work toward remaining goals and maximize function as well as decrease likelihood of symptom increase or recurrence.    PT Next Visit Plan Assess internally, MFR to LB, deep-core recruitment, education on soda-can theory and body mechanics. Further scar mobility/cupping.    PT Home Exercise Plan diaphragmatic breathing, self c-section scar release, lymphatic massage and exercises, LB stretch, side-stretch.    Consulted and Agree with Plan of Care Patient           Patient will benefit from skilled therapeutic intervention in order to improve the following deficits and impairments:     Visit Diagnosis: Abnormal posture  Sacrococcygeal disorders, not elsewhere classified  Other muscle spasm     Problem List Patient Active Problem List   Diagnosis Date Noted  . Patient desires vaginal birth after cesarean section (VBAC) 08/23/2019  . Pelvic pressure in pregnancy 08/23/2019  . Vitamin D deficiency 07/27/2019  . Elevated prolactin level 11/05/2018  . History of low transverse cesarean section 09/25/2017   . OBESITY, NOS 06/19/2006  . RHINITIS, ALLERGIC 06/19/2006  . ASTHMA, EXERCISE INDUCED 06/19/2006   Cleophus Molt DPT, ATC Cleophus Molt 11/22/2019, 4:39 PM  [facility] MAIN [facility] SERVICES [address] [place], [place], [address] Phone: [phone]   Fax:  [phone]  Name: Natalie Beck MRN: [id number] Date of Birth: 1990/09/17

## 2019-11-24 LAB — URINE CULTURE

## 2019-11-29 ENCOUNTER — Ambulatory Visit: Payer: Medicaid Other

## 2019-11-30 ENCOUNTER — Inpatient Hospital Stay: Admission: RE | Admit: 2019-11-30 | Payer: Medicaid Other | Source: Ambulatory Visit

## 2019-12-06 ENCOUNTER — Ambulatory Visit: Payer: Medicaid Other

## 2019-12-06 ENCOUNTER — Other Ambulatory Visit: Payer: Self-pay

## 2019-12-06 DIAGNOSIS — R293 Abnormal posture: Secondary | ICD-10-CM

## 2019-12-06 DIAGNOSIS — M62838 Other muscle spasm: Secondary | ICD-10-CM

## 2019-12-06 DIAGNOSIS — M533 Sacrococcygeal disorders, not elsewhere classified: Secondary | ICD-10-CM

## 2019-12-06 NOTE — Therapy (Signed)
[place] [facility] MAIN [facility] SERVICES [address] [place], [place], [address] Phone: [phone]   Fax:  [phone]  Physical Therapy Treatment  The patient has been informed of current processes in place at Outpatient Rehab to protect patients from Covid-19 exposure including social distancing, schedule modifications, and new cleaning procedures. After discussing their particular risk with a therapist based on the patient's personal risk factors, the patient has decided to proceed with in-person therapy.   Patient Details  Name: Natalie Beck MRN: [id number] Date of Birth: 06/12/1990 No data recorded  Encounter Date: 12/06/2019   PT End of Session - 12/07/19 0801    Visit Number 7    Number of Visits 15    Date for PT Re-Evaluation 12/06/19    Authorization Type mcaid    Authorization Time Period 11/01/19 through 01/20/20 12 visits (48 units)    Authorization - Visit Number 4    Authorization - Number of Visits 12    Progress Note Due on Visit 10    PT Start Time 1245    PT Stop Time 1330    PT Time Calculation (min) 45 min    Activity Tolerance Patient tolerated treatment well;No increased pain    Behavior During Therapy WFL for tasks assessed/performed           Past Medical History:  Diagnosis Date  . Anemia   . Asthma     Past Surgical History:  Procedure Laterality Date  . CESAREAN SECTION N/A 08/17/2017   Procedure: CESAREAN SECTION;  Surgeon: Linzie Collin, MD;  Location: [facility] ORS;  Service: Obstetrics;  Laterality: N/A;  . dental implant    . WISDOM TOOTH EXTRACTION      There were no vitals filed for this visit.  Pelvic Floor Physical Therapy Treatment Note  SCREENING  Changes in medications, allergies, or medical history?: none    SUBJECTIVE  Patient reports: Has been busy working [employer] in the evenings and the weekends. Has been feeling "pressure here-and-there" and some braxton-hicks contractions. The  fluid seems to build up when she is sitting in the car a lot, not sure how much more she can do it. Feeling ~ 5/10 swelling today, has not been able to do the massage yet. Fees a backup of fluid in her head as she gets about half way through her massages.     Precautions:  [redacted] weeks pregnant.   Location of pain: lower abdomen Current pain: 5/10  Max pain: 8/10 Least pain: 0/10 Nature of pain:pressure  ** no increased pain following session  Patient Goals: Be able to sit on the floor to nurse her daughter for 30 min. Or greater without having discomfort/pressure.    OBJECTIVE  Changes in: Posture/Observations:  Hyperkyphosis/lordosis. PSIS appear level in standing, ankles appear level in supine. Pt. Is having pain/fullness/discomfort in the areas where lymph-nodes are most prevalent but has more swelling/back-up on the R>L.(from prior session)   Range of Motion/Flexibilty:    Strength/MMT:  LE MMT:   Pelvic Floor External Exam: Introitus Appears: WNL Skin integrity: increased edema Palpation: TTP to B IC> STP Cough: not assessed Prolapse visible?: N/A Scar mobility: N/A  Internal Vaginal Exam: Strength (PERF): 3/5, 8 sec, 2 times.  Symmetry:   Palpation: TTP throughout all internal muscles B with least amount at posterior fourchette. Prolapse: N/A   Abdominal:  Able to recruit TA and obliques with MIN TC, VC for posterior pelvic tilt in supine. (from prior note)  Palpation: Mildly decreased mobility of c-section scar.   Gait Analysis:  INTERVENTIONS THIS SESSION: Manual: Assessed PFM and educated on how to do self/ partner-assisted TP release to external PFM as well as posterior fourchette. Performed cupping MFR along c-section scar to improve mobility and decrease restriction of lymphatic flow as much as possible.   Total time: 45 min.                              PT Short Term Goals - 11/01/19 1634      PT SHORT TERM GOAL  #1   Title Patient will demonstrate functional recruitment of TA with breathing, sit-to-stand, squatting/lifting, and walking to allow for improved pelvic brace coordination, improved balance, and decreased downward pressure on the pelvic organs.    Baseline Pt. reports increased POP sx. when she bends forward or sits on the ground to breastfeed, does not demostrate understanding of precautions for POP    Time 5    Period Weeks    Status Achieved    Target Date 11/01/19      PT SHORT TERM GOAL #2   Title Patient will demonstrate improved pelvic alignment and balance of musculature surrounding the pelvis to facilitate decreased PFM spasms and decrease pelvic pain.    Baseline L anterior innominate rotation.    Time 5    Period Weeks    Status Achieved    Target Date 11/01/19             PT Long Term Goals - 11/01/19 1634      PT LONG TERM GOAL #1   Title Pt. will be able to sit comfortably for >30 min. in order to breastfeed and will not have increased POP pressure or discomfort while sitting or upon standing.    Baseline Pt. having a lot of uncomfortable pressure in the lower abdomen after sitting on the floor/breastfeeding or sitting anywhere >30 min. As of 7/12: Pt. is still having pain/pressure in the vulvar area but it seems to be related more to poor venous retur/lyphatic flow than POP, internal assessment will help determine if PFM spasms are present.    Time 10    Period Weeks    Status On-going    Target Date 01/10/20      PT LONG TERM GOAL #2   Title Pt. will improve in FOTO score by 4 points to demonstrate improved function.    Baseline FOTO PFDI pain: 4    Time 10    Period Weeks    Status On-going    Target Date 01/10/20      PT LONG TERM GOAL #3   Title Patient will demonstrate improved sitting and standing posture to demonstrate learning and decrease stress on the pelvic floor with functional activity.    Baseline anterior pelvic tilt/hyperlordosis in sitting  and standing As of 7/12: Pt. demonstrates improved sitting posture when thinking about it but needs more practice and cueing for standing posture.    Time 10    Period Weeks    Status On-going    Target Date 03/20/20                 Plan - 12/07/19 0801    Clinical Impression Statement Pt. Responded well to all interventions today, demonstrating improved scar mobility as well as understanding and correct performance of all education and exercises provided today. They will continue to benefit from skilled physical therapy to  work toward remaining goals and maximize function as well as decrease likelihood of symptom increase or recurrence.    PT Next Visit Plan Assess internally, MFR to LB, deep-core recruitment, education on soda-can theory and body mechanics. Further scar mobility/cupping.    PT Home Exercise Plan diaphragmatic breathing, self c-section scar release, lymphatic massage and exercises, LB stretch, side-stretch self/partner assisted TP release to external and posterior fourchette.    Consulted and Agree with Plan of Care Patient           Patient will benefit from skilled therapeutic intervention in order to improve the following deficits and impairments:     Visit Diagnosis: Abnormal posture  Sacrococcygeal disorders, not elsewhere classified  Other muscle spasm     Problem List Patient Active Problem List   Diagnosis Date Noted  . Patient desires vaginal birth after cesarean section (VBAC) 08/23/2019  . Pelvic pressure in pregnancy 08/23/2019  . Vitamin D deficiency 07/27/2019  . Elevated prolactin level 11/05/2018  . History of low transverse cesarean section 09/25/2017  . OBESITY, NOS 06/19/2006  . RHINITIS, ALLERGIC 06/19/2006  . ASTHMA, EXERCISE INDUCED 06/19/2006   Cleophus Molt DPT, ATC Cleophus Molt 12/07/2019, 8:06 AM  [place] [facility] MAIN [facility] SERVICES [address] [place], [place], [address] Phone:  [phone]   Fax:  [phone]  Name: Natalie Beck MRN: [id number] Date of Birth: 30-Oct-1990

## 2019-12-06 NOTE — Patient Instructions (Signed)
Self External Trigger Point Relief    1) Wash your hands and prop yourself up in a way where you can easily reach the vagina. You may wish to have a small hand-held mirror near by.  2) Use the 2 middle fingers to put gentle pressure on the three external pelvic floor muscles and hold pressure and take deep breaths as you allow the tension to release and discomfort to dissipate   3) Repeat the process for any trigger points you find spending between 3-10 minutes on this every 1-2 days until you do not find any more trigger points or you are told otherwise by your therapist.  Self Posterior Fourchette Stretching/Mobilization    1) Wash your hands and prop your body up so you can easily reach the vagina, bring hand-held mirror if desired.  2) Apply lubricant to the thumb and vaginal opening  3) Place thumb ~ 1/2 an inch into the vagina with the pad of the thumb pointed down and apply gentle pressure to the posterior fourchette.  4) Gently sweep the thumb side to side and in/out while maintaining pressure down toward the anus. Make sure the pressure is not so great that your muscles tighten up and guard, just enough to create slight discomfort.  Do this for ~ 3 min. Per night to decrease tightness and tenderness at the vaginal opening.  

## 2019-12-07 ENCOUNTER — Ambulatory Visit (INDEPENDENT_AMBULATORY_CARE_PROVIDER_SITE_OTHER): Payer: Medicaid Other | Admitting: Obstetrics and Gynecology

## 2019-12-07 ENCOUNTER — Ambulatory Visit (INDEPENDENT_AMBULATORY_CARE_PROVIDER_SITE_OTHER): Payer: Medicaid Other

## 2019-12-07 ENCOUNTER — Other Ambulatory Visit: Payer: Self-pay

## 2019-12-07 ENCOUNTER — Encounter: Payer: Self-pay | Admitting: Obstetrics and Gynecology

## 2019-12-07 VITALS — BP 110/73 | HR 88 | Wt 250.0 lb

## 2019-12-07 DIAGNOSIS — Z3493 Encounter for supervision of normal pregnancy, unspecified, third trimester: Secondary | ICD-10-CM

## 2019-12-07 DIAGNOSIS — Z3483 Encounter for supervision of other normal pregnancy, third trimester: Secondary | ICD-10-CM

## 2019-12-07 DIAGNOSIS — O34219 Maternal care for unspecified type scar from previous cesarean delivery: Secondary | ICD-10-CM

## 2019-12-07 DIAGNOSIS — Z6841 Body Mass Index (BMI) 40.0 and over, adult: Secondary | ICD-10-CM

## 2019-12-07 DIAGNOSIS — Z3A30 30 weeks gestation of pregnancy: Secondary | ICD-10-CM

## 2019-12-07 DIAGNOSIS — Z98891 History of uterine scar from previous surgery: Secondary | ICD-10-CM

## 2019-12-07 DIAGNOSIS — Z3A32 32 weeks gestation of pregnancy: Secondary | ICD-10-CM

## 2019-12-07 LAB — POCT URINALYSIS DIPSTICK OB
Bilirubin, UA: NEGATIVE
Blood, UA: NEGATIVE
Glucose, UA: NEGATIVE
Ketones, UA: NEGATIVE
Nitrite, UA: NEGATIVE
POC,PROTEIN,UA: NEGATIVE
Spec Grav, UA: 1.01 (ref 1.010–1.025)
Urobilinogen, UA: 0.2 E.U./dL
pH, UA: 8 (ref 5.0–8.0)

## 2019-12-07 NOTE — Progress Notes (Signed)
ROB-Pt present for routine prenatal care. Pt stated that she was doing well no problems.  

## 2019-12-07 NOTE — Progress Notes (Signed)
ROB: Patient referred from midwifery service for [facility] consultation.  She had prior C-section for NRFHT at full dilation, suspected CPD and arrest of descent. Patient notes that she did not get to push very long (was at -3 station per note) before decision was made for C-section. Infant was 6 lb 5 oz. Pregnancy also complicated by morbid obesity. Growth scan today normal (42%ile) but transverse lie, normal AFI. VBAC calculated score is 36.1%.   The following risks were discussed with the patient.  Risk of uterine rupture at term is 0.78 percent with TOLAC and 0.22 percent with ERCD. 1 in 10 uterine ruptures will result in neonatal death or neurological injury. The benefits of a trial of labor after cesarean (TOLAC) resulting in a vaginal birth after cesarean (VBAC) include the following: shorter length of hospital stay and postpartum recovery (in most cases); fewer complications, such as postpartum fever, wound or uterine infection, thromboembolism (blood clots in the leg or lung), need for blood transfusion and fewer neonatal breathing problems. The risks of an attempted VBAC or TOLAC include the following: . Risk of failed trial of labor after cesarean (TOLAC) without a vaginal birth after cesarean (VBAC) resulting in repeat cesarean delivery (RCD) in about 20 to 40 percent of women who attempt VBAC.  Marland Kitchen Risk of rupture of uterus resulting in an emergency cesarean delivery. The risk of uterine rupture may be related in part to the type of uterine incision made during the first cesarean delivery. A previous transverse uterine incision has the lowest risk of rupture (0.2 to 1.5 percent risk). Vertical or T-shaped uterine incisions have a higher risk of uterine rupture (4 to 9 percent risk)The risk of fetal death is very low with both VBAC and elective repeat cesarean delivery (ERCD), but the likelihood of fetal death is higher with VBAC than with ERCD. Maternal death is very rare with either type of  delivery. The risks of an elective repeat cesarean delivery (ERCD) were reviewed with the patient including but not limited to: 05/998 risk of uterine rupture which could have serious consequences, bleeding which may require transfusion; infection which may require antibiotics; injury to bowel, bladder or other surrounding organs (bowel, bladder, ureters); injury to the fetus; need for additional procedures including hysterectomy in the event of a life-threatening hemorrhage; thromboembolic phenomenon; abnormal placentation; incisional problems; death and other postoperative or anesthesia complications.     Discussion had with patient regarding concern for CPD, recommend pelvimetry evaluation around 36-[redacted] weeks gestation. Also will be due for repeat growth scan and weekly antenatal testing for obesity.  Patient notes that she feels this baby may be bigger. Will assess at that time and can make a decision. Also discussed [employer] website for positional changes prior to and during labor. Will also have doula present. Plans to breastfeed, breastfed last infant for almost 2 years. Still needs Anesthesia consult (notes she missed last appt due to car trouble, needs to reschedule).   The following were addressed during this visit:  Breastfeeding Education - Early initiation of breastfeeding    Comments: Keeps milk supply adequate, helps contract uterus and slow bleeding, and early milk is the perfect first food and is easy to digest.   - Feeding on demand or baby-led feeding    Comments: Helps prevent breastfeeding complications, helps bring in good milk supply, prevents under or overfeeding, and helps baby feel content and satisfied   - Frequent feeding to help assure optimal milk production    Comments: Making a  full supply of milk requires frequent removal of milk from breasts, infant will eat 8-12 times in 24 hours, if separated from infant use breast massage, hand expression and/ or pumping  to remove milk from breasts.   - Exclusive breastfeeding for the first 6 months    Comments: Builds a healthy milk supply and keeps it up, protects baby from sickness and disease, and breastmilk has everything your baby needs for the first 6 months.

## 2019-12-17 ENCOUNTER — Encounter: Payer: Self-pay | Admitting: Surgical

## 2019-12-20 ENCOUNTER — Encounter: Payer: Self-pay | Admitting: Certified Nurse Midwife

## 2019-12-20 ENCOUNTER — Other Ambulatory Visit: Payer: Self-pay

## 2019-12-20 ENCOUNTER — Ambulatory Visit (INDEPENDENT_AMBULATORY_CARE_PROVIDER_SITE_OTHER): Payer: Medicaid Other | Admitting: Certified Nurse Midwife

## 2019-12-20 VITALS — BP 118/68 | HR 96 | Wt 249.5 lb

## 2019-12-20 DIAGNOSIS — Z98891 History of uterine scar from previous surgery: Secondary | ICD-10-CM

## 2019-12-20 DIAGNOSIS — Z3483 Encounter for supervision of other normal pregnancy, third trimester: Secondary | ICD-10-CM | POA: Diagnosis not present

## 2019-12-20 DIAGNOSIS — R82998 Other abnormal findings in urine: Secondary | ICD-10-CM

## 2019-12-20 DIAGNOSIS — Z6841 Body Mass Index (BMI) 40.0 and over, adult: Secondary | ICD-10-CM

## 2019-12-20 DIAGNOSIS — R319 Hematuria, unspecified: Secondary | ICD-10-CM

## 2019-12-20 DIAGNOSIS — O34219 Maternal care for unspecified type scar from previous cesarean delivery: Secondary | ICD-10-CM

## 2019-12-20 DIAGNOSIS — Z3A34 34 weeks gestation of pregnancy: Secondary | ICD-10-CM

## 2019-12-20 LAB — POCT URINALYSIS DIPSTICK OB
Bilirubin, UA: NEGATIVE
Glucose, UA: NEGATIVE
Nitrite, UA: NEGATIVE
POC,PROTEIN,UA: NEGATIVE
Spec Grav, UA: >=1.03 — AB (ref 1.010–1.025)
Urobilinogen, UA: 0.2 E.U./dL
pH, UA: 6.5 (ref 5.0–8.0)

## 2019-12-20 NOTE — Patient Instructions (Signed)
Third Trimester of Pregnancy  The third trimester is from week 28 through week 40 (months 7 through 9). This trimester is when your unborn baby (fetus) is growing very fast. At the end of the ninth month, the unborn baby is about 20 inches in length. It weighs about 6-10 pounds. Follow these instructions at home: Medicines  Take over-the-counter and prescription medicines only as told by your doctor. Some medicines are safe and some medicines are not safe during pregnancy.  Take a prenatal vitamin that contains at least 600 micrograms (mcg) of folic acid.  If you have trouble pooping (constipation), take medicine that will make your stool soft (stool softener) if your doctor approves. Eating and drinking   Eat regular, healthy meals.  Avoid raw meat and uncooked cheese.  If you get low calcium from the food you eat, talk to your doctor about taking a daily calcium supplement.  Eat four or five small meals rather than three large meals a day.  Avoid foods that are high in fat and sugars, such as fried and sweet foods.  To prevent constipation: ? Eat foods that are high in fiber, like fresh fruits and vegetables, whole grains, and beans. ? Drink enough fluids to keep your pee (urine) clear or pale yellow. Activity  Exercise only as told by your doctor. Stop exercising if you start to have cramps.  Avoid heavy lifting, wear low heels, and sit up straight.  Do not exercise if it is too hot, too humid, or if you are in a place of great height (high altitude).  You may continue to have sex unless your doctor tells you not to. Relieving pain and discomfort  Wear a good support bra if your breasts are tender.  Take frequent breaks and rest with your legs raised if you have leg cramps or low back pain.  Take warm water baths (sitz baths) to soothe pain or discomfort caused by hemorrhoids. Use hemorrhoid cream if your doctor approves.  If you develop puffy, bulging veins (varicose  veins) in your legs: ? Wear support hose or compression stockings as told by your doctor. ? Raise (elevate) your feet for 15 minutes, 3-4 times a day. ? Limit salt in your food. Safety  Wear your seat belt when driving.  Make a list of emergency phone numbers, including numbers for family, friends, the hospital, and police and fire departments. Preparing for your baby's arrival To prepare for the arrival of your baby:  Take prenatal classes.  Practice driving to the hospital.  Visit the hospital and tour the maternity area.  Talk to your work about taking leave once the baby comes.  Pack your hospital bag.  Prepare the baby's room.  Go to your doctor visits.  Buy a rear-facing car seat. Learn how to install it in your car. General instructions  Do not use hot tubs, steam rooms, or saunas.  Do not use any products that contain nicotine or tobacco, such as cigarettes and e-cigarettes. If you need help quitting, ask your doctor.  Do not drink alcohol.  Do not douche or use tampons or scented sanitary pads.  Do not cross your legs for long periods of time.  Do not travel for long distances unless you must. Only do so if your doctor says it is okay.  Visit your dentist if you have not gone during your pregnancy. Use a soft toothbrush to brush your teeth. Be gentle when you floss.  Avoid cat litter boxes and soil   used by cats. These carry germs that can cause birth defects in the baby and can cause a loss of your baby (miscarriage) or stillbirth.  Keep all your prenatal visits as told by your doctor. This is important. Contact a doctor if:  You are not sure if you are in labor or if your water has broken.  You are dizzy.  You have mild cramps or pressure in your lower belly.  You have a nagging pain in your belly area.  You continue to feel sick to your stomach, you throw up, or you have watery poop.  You have bad smelling fluid coming from your vagina.  You have  pain when you pee. Get help right away if:  You have a fever.  You are leaking fluid from your vagina.  You are spotting or bleeding from your vagina.  You have severe belly cramps or pain.  You lose or gain weight quickly.  You have trouble catching your breath and have chest pain.  You notice sudden or extreme puffiness (swelling) of your face, hands, ankles, feet, or legs.  You have not felt the baby move in over an hour.  You have severe headaches that do not go away with medicine.  You have trouble seeing.  You are leaking, or you are having a gush of fluid, from your vagina before you are 37 weeks.  You have regular belly spasms (contractions) before you are 37 weeks. Summary  The third trimester is from week 28 through week 40 (months 7 through 9). This time is when your unborn baby is growing very fast.  Follow your doctor's advice about medicine, food, and activity.  Get ready for the arrival of your baby by taking prenatal classes, getting all the baby items ready, preparing the baby's room, and visiting your doctor to be checked.  Get help right away if you are bleeding from your vagina, or you have chest pain and trouble catching your breath, or if you have not felt your baby move in over an hour. This information is not intended to replace advice given to you by your health care provider. Make sure you discuss any questions you have with your health care provider. Document Revised: 07/30/2018 Document Reviewed: 05/14/2016 Elsevier Patient Education  2020 [employer]   Fetal Movement Counts Patient Name: ________________________________________________ Patient Due Date: ____________________ What is a fetal movement count?  A fetal movement count is the number of times that you feel your baby move during a certain amount of time. This may also be called a fetal kick count. A fetal movement count is recommended for every pregnant woman. You may be asked to  start counting fetal movements as early as week 28 of your pregnancy. Pay attention to when your baby is most active. You may notice your baby's sleep and wake cycles. You may also notice things that make your baby move more. You should do a fetal movement count:  When your baby is normally most active.  At the same time each day. A good time to count movements is while you are resting, after having something to eat and drink. How do I count fetal movements? 1. Find a quiet, comfortable area. Sit, or lie down on your side. 2. Write down the date, the start time and stop time, and the number of movements that you felt between those two times. Take this information with you to your health care visits. 3. Write down your start time when you feel   the first movement. 4. Count kicks, flutters, swishes, rolls, and jabs. You should feel at least 10 movements. 5. You may stop counting after you have felt 10 movements, or if you have been counting for 2 hours. Write down the stop time. 6. If you do not feel 10 movements in 2 hours, contact your health care provider for further instructions. Your health care provider may want to do additional tests to assess your baby's well-being. Contact a health care provider if:  You feel fewer than 10 movements in 2 hours.  Your baby is not moving like he or she usually does. Date: ____________ Start time: ____________ Stop time: ____________ Movements: ____________ Date: ____________ Start time: ____________ Stop time: ____________ Movements: ____________ Date: ____________ Start time: ____________ Stop time: ____________ Movements: ____________ Date: ____________ Start time: ____________ Stop time: ____________ Movements: ____________ Date: ____________ Start time: ____________ Stop time: ____________ Movements: ____________ Date: ____________ Start time: ____________ Stop time: ____________ Movements: ____________ Date: ____________ Start time: ____________  Stop time: ____________ Movements: ____________ Date: ____________ Start time: ____________ Stop time: ____________ Movements: ____________ Date: ____________ Start time: ____________ Stop time: ____________ Movements: ____________ This information is not intended to replace advice given to you by your health care provider. Make sure you discuss any questions you have with your health care provider. Document Revised: 11/26/2018 Document Reviewed: 11/26/2018 Elsevier Patient Education  2020 Elsevier Inc.  

## 2019-12-22 DIAGNOSIS — Z419 Encounter for procedure for purposes other than remedying health state, unspecified: Secondary | ICD-10-CM | POA: Diagnosis not present

## 2019-12-22 LAB — URINE CULTURE

## 2019-12-23 ENCOUNTER — Other Ambulatory Visit: Payer: Self-pay | Admitting: Certified Nurse Midwife

## 2019-12-23 ENCOUNTER — Encounter: Payer: Self-pay | Admitting: Certified Nurse Midwife

## 2019-12-23 DIAGNOSIS — R8271 Bacteriuria: Secondary | ICD-10-CM | POA: Insufficient documentation

## 2019-12-23 MED ORDER — AMPICILLIN 500 MG PO CAPS: 500.0000 mg | ORAL_CAPSULE | Freq: Four times a day (QID) | ORAL | 0 refills | Status: AC

## 2019-12-23 NOTE — Progress Notes (Signed)
ROB-Doing well, questions VBAC due to history of CPD. Discussed home labor preparation techniques. Urine culture ordered due to hematuria, see chart. Anesthesia consult scheduled for 12/24/2019 at 1030. Awaiting Volunteer doula assignment. Anticipatory guidance regarding course of prenatal care. Reviewed red flag symptoms and when to call. RTC x 2 weeks for growth ultrasound/AFI and ROB or sooner if needed.

## 2019-12-23 NOTE — Progress Notes (Signed)
Rx Ampicillin, see orders.    Serafina Royals, CNM [facility] 12/23/19 6:18 AM

## 2019-12-24 ENCOUNTER — Other Ambulatory Visit: Payer: Self-pay

## 2019-12-24 ENCOUNTER — Encounter
Admission: RE | Admit: 2019-12-24 | Discharge: 2019-12-24 | Disposition: A | Discharge status: Home / Self Care | Payer: Medicaid Other | Source: Ambulatory Visit | Attending: Anesthesiology | Admitting: Anesthesiology

## 2019-12-24 NOTE — Consult Note (Signed)
[facility] Anesthesia Consultation  Natalie Beck JME:[id number] DOB: [birth year] DOA: 12/24/2019 PCP: Hoy Register, MD   Requesting physician: Serafina Royals, CNM Date of consultation: 12/24/19 Reason for consultation: Obesity during pregnancy  CHIEF COMPLAINT:  Obesity during pregnancy  HISTORY OF PRESENT ILLNESS: Natalie Beck  is a 28 y.o. female with a known history of obesity during pregnancy. Denies hx of cardiovascular disease. Has mild intermittent asthma, minimal inhaler use. Denies personal or family hx of bleeding disorders. Had a cesarean delivery with her daughter, done under spinal anesthesia, planning for TOLAC.   PAST MEDICAL HISTORY:   Past Medical History:  Diagnosis Date  . Anemia   . Asthma     PAST SURGICAL HISTORY:  Past Surgical History:  Procedure Laterality Date  . CESAREAN SECTION N/A 08/17/2017   Procedure: CESAREAN SECTION;  Surgeon: Linzie Collin, MD;  Location: [facility] ORS;  Service: Obstetrics;  Laterality: N/A;  . dental implant    . WISDOM TOOTH EXTRACTION      SOCIAL HISTORY:  Social History   Tobacco Use  . Smoking status: Former Games developer  . Smokeless tobacco: Never Used  Substance Use Topics  . Alcohol use: Not Currently    Alcohol/week: 2.0 standard drinks    Types: 2 Standard drinks or equivalent per week    FAMILY HISTORY:  Family History  Problem Relation Age of Onset  . Asthma Father   . Asthma Sister   . Hypertension Sister   . Rheum arthritis Paternal Grandmother   . Cancer Paternal Grandmother        lung  . Cirrhosis Maternal Grandmother   . Cancer Other        maternal great grandmother-leukemia  . Cancer Paternal Grandfather   . Breast cancer Neg Hx   . Ovarian cancer Neg Hx     DRUG ALLERGIES:  Allergies  Allergen Reactions  . Citrus Dermatitis    Hypoallergenic dermatitis-tongue swells and itches  . Other     Pet dander    REVIEW OF SYSTEMS:    RESPIRATORY: No cough, shortness of breath, wheezing.  CARDIOVASCULAR: No chest pain, orthopnea, edema.  HEMATOLOGY: No anemia, easy bruising or bleeding SKIN: No rash or lesion. NEUROLOGIC: No tingling, numbness, weakness.  PSYCHIATRY: No anxiety or depression.   MEDICATIONS AT HOME:  Prior to Admission medications   Medication Sig Start Date End Date Taking? Authorizing Provider  albuterol (PROVENTIL HFA;VENTOLIN HFA) 108 (90 Base) MCG/ACT inhaler Inhale into the lungs every 6 (six) hours as needed for wheezing or shortness of breath.    [provider]  ampicillin (PRINCIPEN) 500 MG capsule Take 1 capsule (500 mg total) by mouth 4 (four) times daily for 7 days. 12/23/19 12/30/19  Lawhorn, Vanessa Magdalena, CNM  aspirin EC 81 MG tablet Take 1 tablet (81 mg total) by mouth daily. Take after 12 weeks for prevention of preeclampssia later in pregnancy 07/26/19   Lawhorn, Vanessa [place]thing[place], CNM  loratadine-pseudoephedrine (CLARITIN-D 24 HOUR) 10-240 MG 24 hr tablet Take by mouth. 10/05/18   [provider]  OVER THE COUNTER MEDICATION Take 1 tablet by mouth 3 (three) times daily. Sunflower Lithosyine    [provider]  Prenatal Vit-Fe Fumarate-FA (MULTIVITAMIN-PRENATAL) 27-0.8 MG TABS tablet Take 1 tablet by mouth daily at 12 noon.    [provider]  Vitamin D, Ergocalciferol, (DRISDOL) 1.25 MG (50000 UNIT) CAPS capsule Take 1 capsule (50,000 Units total) by mouth every 7 (seven) days. 07/30/19   Lawhorn, Vanessa [place],  CNM      PHYSICAL EXAMINATION:   VITAL SIGNS: Last menstrual period 04/15/2019, currently breastfeeding.  GENERAL:  29 y.o.-year-old patient no acute distress.  HEENT: Head atraumatic, normocephalic. Oropharynx and nasopharynx clear. MP 1, TM distance >3 cm, normal mouth opening. LUNGS: No use of accessory muscles of respiration.   EXTREMITIES: No pedal edema, cyanosis, or clubbing.  NEUROLOGIC: normal gait PSYCHIATRIC: The patient is  alert and oriented x 3.  SKIN: No obvious rash, lesion, or ulcer.    IMPRESSION AND PLAN:   Natalie Beck  is a 29 y.o. female presenting with obesity during pregnancy. BMI is currently 48 at [redacted] weeks gestation. Planning for TOLAC, desires NCB.  Airway exam reassuring. Spinal interspaces palpable.   We discussed analgesic options during labor including epidural analgesia. Discussed that in obesity there can be increased difficulty with epidural placement or even failure of successful epidural. We also discussed that even after successful epidural placement there is increased risk of catheter migration out of the epidural space that would require catheter replacement. Discussed use of epidural vs spinal vs GA if cesarean delivery is required. Discussed increased risk of difficult intubation during pregnancy should an emergency cesarean delivery be required.   Plan for delivery at [facility].

## 2020-01-06 ENCOUNTER — Other Ambulatory Visit: Payer: Self-pay

## 2020-01-06 ENCOUNTER — Ambulatory Visit (INDEPENDENT_AMBULATORY_CARE_PROVIDER_SITE_OTHER): Payer: Medicaid Other | Admitting: Obstetrics and Gynecology

## 2020-01-06 ENCOUNTER — Encounter: Payer: Self-pay | Admitting: Obstetrics and Gynecology

## 2020-01-06 ENCOUNTER — Ambulatory Visit (INDEPENDENT_AMBULATORY_CARE_PROVIDER_SITE_OTHER): Payer: Medicaid Other

## 2020-01-06 VITALS — BP 110/77 | HR 92 | Wt 253.4 lb

## 2020-01-06 DIAGNOSIS — O99213 Obesity complicating pregnancy, third trimester: Secondary | ICD-10-CM

## 2020-01-06 DIAGNOSIS — O34219 Maternal care for unspecified type scar from previous cesarean delivery: Secondary | ICD-10-CM

## 2020-01-06 DIAGNOSIS — Z3A37 37 weeks gestation of pregnancy: Secondary | ICD-10-CM

## 2020-01-06 DIAGNOSIS — Z6841 Body Mass Index (BMI) 40.0 and over, adult: Secondary | ICD-10-CM

## 2020-01-06 DIAGNOSIS — O26893 Other specified pregnancy related conditions, third trimester: Secondary | ICD-10-CM | POA: Diagnosis not present

## 2020-01-06 DIAGNOSIS — Z3A34 34 weeks gestation of pregnancy: Secondary | ICD-10-CM

## 2020-01-06 DIAGNOSIS — E669 Obesity, unspecified: Secondary | ICD-10-CM

## 2020-01-06 DIAGNOSIS — Z98891 History of uterine scar from previous surgery: Secondary | ICD-10-CM

## 2020-01-06 DIAGNOSIS — Z3483 Encounter for supervision of other normal pregnancy, third trimester: Secondary | ICD-10-CM

## 2020-01-06 DIAGNOSIS — O0993 Supervision of high risk pregnancy, unspecified, third trimester: Secondary | ICD-10-CM

## 2020-01-06 DIAGNOSIS — R8271 Bacteriuria: Secondary | ICD-10-CM | POA: Diagnosis not present

## 2020-01-06 LAB — POCT URINALYSIS DIPSTICK OB
Bilirubin, UA: NEGATIVE
Blood, UA: NEGATIVE
Glucose, UA: NEGATIVE
Ketones, UA: NEGATIVE
Leukocytes, UA: NEGATIVE
Nitrite, UA: NEGATIVE
POC,PROTEIN,UA: NEGATIVE
Spec Grav, UA: >=1.03 — AB (ref 1.010–1.025)
Urobilinogen, UA: 0.2 E.U./dL
pH, UA: 5 (ref 5.0–8.0)

## 2020-01-06 NOTE — Progress Notes (Addendum)
ROB: Patient doing well, no major issues. Reviewed ultrasound, growth at 52%ile (6 lbs 14 oz), vertex.  Patient has questions regarding GBS status, questions answered. Has completed antibiotics for UTI.  Also answered further questions regarding TOLAC. Advised on herbal prep for cervical ripening. Can also begin nipple stimulation (pumping) after next week. Discussed need for antenatal testing due to BMI, NST done today. For weekly NSTs until delivery. Reiterated recommendations for birth by 40 weeks due to increased BMI. To offer flu vaccine next visit. RTC in 1 week.    NONSTRESS TEST INTERPRETATION  INDICATIONS: Obesity  FHR baseline: 130 bpm RESULTS:Reactive COMMENTS: uterine irritability, 1 shallow variable deceleration   PLAN: 1. Continue fetal kick counts twice a day. 2. Continue antepartum testing as scheduled [facility]

## 2020-01-06 NOTE — Patient Instructions (Signed)
Nonstress Test A nonstress test is a procedure that is done during pregnancy in order to check the baby's heartbeat. The procedure can help show if the baby (fetus) is healthy. It is commonly done when:  The baby is past his or her due date.  The pregnancy is high risk.  The baby is moving less than normal.  The mother has lost a pregnancy in the past.  The health care provider suspects a problem with the baby's growth.  There is too much or too little amniotic fluid. The procedure is often done in the third trimester of pregnancy to find out if an early delivery is needed and whether such a delivery is safe. During a nonstress test, the baby's heartbeat is monitored when the baby is resting and when the baby is moving. If the baby is healthy, the heart rate will increase when he or she moves or kicks and will return to normal when he or she rests. Tell a health care provider about:  Any allergies you have.  Any medical conditions you have.  All medicines you are taking, including vitamins, herbs, eye drops, creams, and over-the-counter medicines. What are the risks? There are no risks to you or your baby from a nonstress test. This procedure should not be painful or uncomfortable. What happens before the procedure?  Eat a meal right before the test or as directed by your health care provider. Food may help encourage the baby to move.  Use the restroom right before the test. What happens during the procedure?  Two monitors will be placed on your abdomen. One will record the baby's heart rate and the other will record the contractions of your uterus.  You may be asked to lie down on your side or to sit upright.  You may be given a button to press when you feel your baby move.  Your health care provider will listen to your baby's heartbeat and recorded it. He or she may also watch your baby's heartbeat on a screen.  If the baby seems to be sleeping, you may be asked to drink  some juice or soda, eat a snack, or change positions. The procedure may vary among health care providers and hospitals. What happens after the procedure?  Your health care provider will discuss the test results with you and make recommendations for the future. Depending on the results, your health care provider may order additional tests or another course of action.  If your health care provider gave you any diet or activity instructions, make sure to follow them.  Keep all follow-up visits as told by your health care provider. This is important. Summary  A nonstress test is a procedure that is done during pregnancy in order to check the baby's heartbeat. The procedure can help show if the baby is healthy.  The procedure is often done in the third trimester of pregnancy to find out if an early delivery is needed and whether such a delivery is safe.  During a nonstress test, the baby's heartbeat is monitored when the baby is resting and when the baby is moving. If the baby is healthy, the heart rate will increase when he or she moves or kicks and will return to normal when he or she rests.  Your health care provider will discuss the test results with you and make recommendations for the future. This information is not intended to replace advice given to you by your health care provider. Make sure you discuss any   questions you have with your health care provider. Document Revised: 07/18/2016 Document Reviewed: 07/18/2016 Elsevier Patient Education  2020 Elsevier Inc.    Group B Streptococcus Infection During Pregnancy Group B Streptococcus (GBS) is a type of bacteria that is often found in healthy people. It is commonly found in the rectum, vagina, and intestines. In people who are healthy and not pregnant, the bacteria rarely cause serious illness or complications. However, women who test positive for GBS during pregnancy can pass the bacteria to the baby during childbirth. This can cause  serious infection in the baby after birth. Women with GBS may also have infections during their pregnancy or soon after childbirth. The infections include urinary tract infections (UTIs) or infections of the uterus. GBS also increases a woman's risk of complications during pregnancy, such as early labor or delivery, miscarriage, or stillbirth. Routine testing for GBS is recommended for all pregnant women. What are the causes? This condition is caused by bacteria called Streptococcus agalactiae. What increases the risk? You may have a higher risk for GBS infection during pregnancy if you had one during a past pregnancy. What are the signs or symptoms? In most cases, GBS infection does not cause symptoms in pregnant women. If symptoms exist, they may include:  Labor that starts before the 37th week of pregnancy.  A UTI or bladder infection. This may cause a fever, frequent urination, or pain and burning during urination.  Fever during labor. There can also be a rapid heartbeat in the mother or baby. Rare but serious symptoms of a GBS infection in women include:  Blood infection (septicemia). This may cause fever, chills, or confusion.  Lung infection (pneumonia). This may cause fever, chills, cough, rapid breathing, chest pain, or difficulty breathing.  Bone, joint, skin, or soft tissue infection. How is this diagnosed? You may be screened for GBS between week 35 and week 37 of pregnancy. If you have symptoms of preterm labor, you may be screened earlier. This condition is diagnosed based on lab test results from:  A swab of fluid from the vagina and rectum.  A urine sample. How is this treated? This condition is treated with antibiotic medicine. Antibiotic medicine may be given:  To you when you go into labor, or as soon as your water breaks. The medicines will continue until after you give birth. If you are having a cesarean delivery, you do not need antibiotics unless your water has  broken.  To your baby, if he or she requires treatment. Your health care provider will check your baby to decide if he or she needs antibiotics to prevent a serious infection. Follow these instructions at home:  Take over-the-counter and prescription medicines only as told by your health care provider.  Take your antibiotic medicine as told by your health care provider. Do not stop taking the antibiotic even if you start to feel better.  Keep all pre-birth (prenatal) visits and follow-up visits as told by your health care provider. This is important. Contact a health care provider if:  You have pain or burning when you urinate.  You have to urinate more often than usual.  You have a fever or chills.  You develop a bad-smelling vaginal discharge. Get help right away if:  Your water breaks.  You go into labor.  You have severe pain in your abdomen.  You have difficulty breathing.  You have chest pain. These symptoms may represent a serious problem that is an emergency. Do not wait to see  if the symptoms will go away. Get medical help right away. Call your local emergency services (911 in the [place] Do not drive yourself to the hospital. Summary  GBS is a type of bacteria that is common in healthy people.  During pregnancy, colonization with GBS can cause serious complications for you or your baby.  Your health care provider will screen you between 35 and 37 weeks of pregnancy to determine if you are colonized with GBS.  If you are colonized with GBS during pregnancy, your health care provider will recommend antibiotics through an IV during labor.  After delivery, your baby will be evaluated for complications related to potential GBS infection and may require antibiotics to prevent a serious infection. This information is not intended to replace advice given to you by your health care provider. Make sure you discuss any questions you have with your health care  provider. Document Revised: 11/02/2018 Document Reviewed: 11/02/2018 Elsevier Patient Education  2020 [employer].

## 2020-01-10 ENCOUNTER — Encounter: Payer: Self-pay | Admitting: Certified Nurse Midwife

## 2020-01-10 ENCOUNTER — Other Ambulatory Visit: Payer: Medicaid Other

## 2020-01-10 ENCOUNTER — Ambulatory Visit (INDEPENDENT_AMBULATORY_CARE_PROVIDER_SITE_OTHER): Payer: Medicaid Other | Admitting: Certified Nurse Midwife

## 2020-01-10 ENCOUNTER — Other Ambulatory Visit: Payer: Self-pay

## 2020-01-10 VITALS — BP 100/68 | HR 92 | Wt 257.0 lb

## 2020-01-10 DIAGNOSIS — Z3A37 37 weeks gestation of pregnancy: Secondary | ICD-10-CM

## 2020-01-10 DIAGNOSIS — Z6841 Body Mass Index (BMI) 40.0 and over, adult: Secondary | ICD-10-CM | POA: Diagnosis not present

## 2020-01-10 DIAGNOSIS — Z3483 Encounter for supervision of other normal pregnancy, third trimester: Secondary | ICD-10-CM | POA: Diagnosis not present

## 2020-01-10 DIAGNOSIS — Z98891 History of uterine scar from previous surgery: Secondary | ICD-10-CM

## 2020-01-10 DIAGNOSIS — O34219 Maternal care for unspecified type scar from previous cesarean delivery: Secondary | ICD-10-CM

## 2020-01-10 LAB — POCT URINALYSIS DIPSTICK OB
Bilirubin, UA: NEGATIVE
Glucose, UA: NEGATIVE
Ketones, UA: NEGATIVE
Nitrite, UA: NEGATIVE
POC,PROTEIN,UA: NEGATIVE
Spec Grav, UA: 1.02 (ref 1.010–1.025)
Urobilinogen, UA: 0.2 E.U./dL
pH, UA: 6.5 (ref 5.0–8.0)

## 2020-01-10 NOTE — Progress Notes (Signed)
ROB-Doing well, no questions or concerns. Started pumping. Ordered herbal prep. NST today reactive, see chart. Urine GC/Ch collected per protocol. Anticipatory guidance regarding course of prenatal care and recommended antenatal testing. Reviewed red flag symptoms and when to call. RTC x 1 week for NST and ROB or sooner if needed.

## 2020-01-10 NOTE — Patient Instructions (Signed)
Fetal Movement Counts Patient Name: ________________________________________________ Patient Due Date: ____________________ What is a fetal movement count?  A fetal movement count is the number of times that you feel your baby move during a certain amount of time. This may also be called a fetal kick count. A fetal movement count is recommended for every pregnant woman. You may be asked to start counting fetal movements as early as week 28 of your pregnancy. Pay attention to when your baby is most active. You may notice your baby's sleep and wake cycles. You may also notice things that make your baby move more. You should do a fetal movement count:  When your baby is normally most active.  At the same time each day. A good time to count movements is while you are resting, after having something to eat and drink. How do I count fetal movements? 1. Find a quiet, comfortable area. Sit, or lie down on your side. 2. Write down the date, the start time and stop time, and the number of movements that you felt between those two times. Take this information with you to your health care visits. 3. Write down your start time when you feel the first movement. 4. Count kicks, flutters, swishes, rolls, and jabs. You should feel at least 10 movements. 5. You may stop counting after you have felt 10 movements, or if you have been counting for 2 hours. Write down the stop time. 6. If you do not feel 10 movements in 2 hours, contact your health care provider for further instructions. Your health care provider may want to do additional tests to assess your baby's well-being. Contact a health care provider if:  You feel fewer than 10 movements in 2 hours.  Your baby is not moving like he or she usually does. Date: ____________ Start time: ____________ Stop time: ____________ Movements: ____________ Date: ____________ Start time: ____________ Stop time: ____________ Movements: ____________ Date: ____________  Start time: ____________ Stop time: ____________ Movements: ____________ Date: ____________ Start time: ____________ Stop time: ____________ Movements: ____________ Date: ____________ Start time: ____________ Stop time: ____________ Movements: ____________ Date: ____________ Start time: ____________ Stop time: ____________ Movements: ____________ Date: ____________ Start time: ____________ Stop time: ____________ Movements: ____________ Date: ____________ Start time: ____________ Stop time: ____________ Movements: ____________ Date: ____________ Start time: ____________ Stop time: ____________ Movements: ____________ This information is not intended to replace advice given to you by your health care provider. Make sure you discuss any questions you have with your health care provider. Document Revised: 11/26/2018 Document Reviewed: 11/26/2018 Elsevier Patient Education  2020 [employer]   Vaginal Birth After Cesarean Delivery  Vaginal birth after cesarean delivery (VBAC) is giving birth vaginally after previously delivering a baby through a cesarean section (C-section). A VBAC may be a safe option for you, depending on your health and other factors. It is important to discuss VBAC with your health care provider early in your pregnancy so you can understand the risks, benefits, and options. Having these discussions early will give you time to make your birth plan. Who are the best candidates for VBAC? The best candidates for VBAC are women who:  Have had one or two prior cesarean deliveries, and the incision made during the delivery was horizontal (low transverse).  Do not have a vertical (classical) scar on their uterus.  Have not had a tear in the wall of their uterus (uterine rupture).  Plan to have more pregnancies. A VBAC is also more likely to be successful:  In   women who have previously given birth vaginally.  When labor starts by itself (spontaneously) before the due  date. What are the benefits of VBAC? The benefits of delivering your baby vaginally instead of by a cesarean delivery include:  A shorter hospital stay.  A faster recovery time.  Less pain.  Avoiding risks associated with major surgery, such as infection and blood clots.  Less blood loss and less need for donated blood (transfusions). What are the risks of VBAC? The main risk of attempting a VBAC is that it may fail, forcing your health care provider to deliver your baby by a C-section. Other risks are rare and include:  Tearing (rupture) of the scar from a past cesarean delivery.  Other risks associated with vaginal deliveries. If a repeat cesarean delivery is needed, the risks include:  Blood loss.  Infection.  Blood clot.  Damage to surrounding organs.  Removal of the uterus (hysterectomy), if it is damaged.  Placenta problems in future pregnancies. What else should I know about my options? Delivering a baby through a VBAC is similar to having a normal spontaneous vaginal delivery. Therefore, it is safe:  To try with twins.  For your health care provider to try to turn the baby from a breech position (external cephalic version) during labor.  With epidural analgesia for pain relief. Consider where you would like to deliver your baby. VBAC should be attempted in facilities where an emergency cesarean delivery can be performed. VBAC is not recommended for home births. Any changes in your health or your baby's health during your pregnancy may make it necessary to change your initial decision about VBAC. Your health care provider may recommend that you do not attempt a VBAC if:  Your baby's suspected weight is 8.8 lb (4 kg) or more.  You have preeclampsia. This is a condition that causes high blood pressure along with other symptoms, such as swelling and headaches.  You will have VBAC less than 19 months after your cesarean delivery.  You are past your due  date.  You need to have labor started (induced) because your cervix is not ready for labor (unfavorable). Where to find more information  American Pregnancy Association: americanpregnancy.org  American Congress of Obstetricians and Gynecologists: acog.org Summary  Vaginal birth after cesarean delivery (VBAC) is giving birth vaginally after previously delivering a baby through a cesarean section (C-section). A VBAC may be a safe option for you, depending on your health and other factors.  Discuss VBAC with your health care provider early in your pregnancy so you can understand the risks, benefits, options, and have plenty of time to make your birth plan.  The main risk of attempting a VBAC is that it may fail, forcing your health care provider to deliver your baby by a C-section. Other risks are rare. This information is not intended to replace advice given to you by your health care provider. Make sure you discuss any questions you have with your health care provider. Document Revised: 08/04/2018 Document Reviewed: 07/16/2016 Elsevier Patient Education  2020 [employer].   Preparing for Vaginal Birth After Cesarean Delivery Vaginal birth after cesarean delivery (VBAC) is giving birth vaginally after previously delivering a baby through a cesarean section (C-section). You and your health are provider will discuss your options and whether you may be a good candidate for VBAC. What are my options? After a cesarean delivery, your options for future deliveries may include:  Scheduled repeat cesarean delivery. This is done in a hospital   with an operating room.  Trial of labor after cesarean (TOLAC). A successful TOLAC results in a vaginal delivery. If it is not successful, you will need to have a cesarean delivery. TOLAC should be attempted in facilities where an emergency cesarean delivery can be performed. It should not be done as a home birth. Talk with your health care provider about the  risks and benefits of each option early in your pregnancy. The best option for you will depend on your preferences and your overall health as well as your baby's. What should I know about my past cesarean delivery? It is important to know what type of incision was made in your uterus in a past cesarean delivery. The type of incision can affect the success of your TOLAC. Types of incisions include:  Low transverse. This is a side-to-side cut low on your uterus. The scar on your skin looks like a horizontal line just above your pubic area. This type of cut is the most common and makes you a good candidate for TOLAC.  Low vertical. This is an up-and-down cut low on your uterus. The scar on your skin looks like a vertical line between your pubic area and belly button. This type of cut puts you at higher risk for problems during TOLAC.  High vertical or classical. This is an up-and-down cut high on your uterus. The scar on your skin looks like a vertical line that runs over the top of your belly button. This type of cut has the highest risk for problems and usually means that TOLAC is not an option. When is VBAC not an option? As you progress through your pregnancy, circumstances may change and you may need to reconsider your options. Your situation may also change even as you begin TOLAC. Your health care provider may not want you to attempt a VBAC if you:  Need to have labor started (induced) because your cervix is not ready for labor.  Have never had a vaginal delivery.  Have had more than two cesarean deliveries.  Are overdue.  Are pregnant with a very large baby.  Have a condition that causes high blood pressure (preeclampsia). Questions to ask your health care provider  Am I a good candidate for TOLAC?  What are my chances of a successful vaginal delivery?  Is my preferred birth location equipped for a TOLAC?  What are my pain management options during a TOLAC? Where to find more  information  American Congress of Obstetricians and Gynecologists: www.acog.org  American College of Nurse-Midwives: www.midwife.org Summary  Vaginal birth after cesarean delivery (VBAC) is giving birth vaginally after previously delivering a baby through a cesarean section (C-section).  VBAC may be a safe and appropriate option for you depending on your medical history and other risk factors. Talk with your health care provider about the options available to you, and the risks and benefits of each early in your pregnancy.  TOLAC should be attempted in facilities where emergency cesarean section procedures can be performed. This information is not intended to replace advice given to you by your health care provider. Make sure you discuss any questions you have with your health care provider. Document Revised: 08/04/2018 Document Reviewed: 07/18/2016 Elsevier Patient Education  2020 Elsevier Inc.  

## 2020-01-13 LAB — GC/CHLAMYDIA PROBE AMP
Chlamydia trachomatis, NAA: NEGATIVE
Neisseria Gonorrhoeae by PCR: NEGATIVE

## 2020-01-14 ENCOUNTER — Telehealth: Payer: Self-pay | Admitting: Certified Nurse Midwife

## 2020-01-14 NOTE — Telephone Encounter (Signed)
Patient called this afternoon stated her nephew had been exposed to Covid and she was around him when they notified the family. Patients nephew does not have any symptoms he will be eligible for testing until 9/28 (Tuesday). Patient confirmed that they do not live together. Patient is scheduled for an appt. on 9/30 and is unsure if she needs to reschedule. Please follow up with patient .Thank you.

## 2020-01-18 ENCOUNTER — Telehealth: Payer: Self-pay

## 2020-01-18 NOTE — Telephone Encounter (Signed)
Called pt in regards to pt call- Left voicemail to return call or correspond via mychart.

## 2020-01-18 NOTE — Telephone Encounter (Signed)
Called pt in regards to pt call- Pt states her nephew was tested for covid yesterday (01/17/20). Informed pt she may still come to her appointment on 01/20/20 depending on her nephews covid test results per Darol Destine, CMA. Pt states his results should be in before her appointment. Informed pt she will need to wear a mask like usual and wash hands/ sanitize. Pt verbalized understanding.

## 2020-01-20 ENCOUNTER — Other Ambulatory Visit: Payer: Self-pay

## 2020-01-20 ENCOUNTER — Other Ambulatory Visit: Payer: Self-pay | Admitting: Certified Nurse Midwife

## 2020-01-20 ENCOUNTER — Ambulatory Visit (INDEPENDENT_AMBULATORY_CARE_PROVIDER_SITE_OTHER): Payer: Medicaid Other | Admitting: Certified Nurse Midwife

## 2020-01-20 ENCOUNTER — Encounter: Payer: Medicaid Other | Admitting: Certified Nurse Midwife

## 2020-01-20 ENCOUNTER — Other Ambulatory Visit: Payer: Medicaid Other

## 2020-01-20 VITALS — BP 122/75 | HR 85 | Wt 257.6 lb

## 2020-01-20 DIAGNOSIS — Z6841 Body Mass Index (BMI) 40.0 and over, adult: Secondary | ICD-10-CM | POA: Diagnosis not present

## 2020-01-20 DIAGNOSIS — O34219 Maternal care for unspecified type scar from previous cesarean delivery: Secondary | ICD-10-CM

## 2020-01-20 DIAGNOSIS — Z98891 History of uterine scar from previous surgery: Secondary | ICD-10-CM

## 2020-01-20 DIAGNOSIS — Z3A39 39 weeks gestation of pregnancy: Secondary | ICD-10-CM | POA: Diagnosis not present

## 2020-01-20 DIAGNOSIS — Z3483 Encounter for supervision of other normal pregnancy, third trimester: Secondary | ICD-10-CM

## 2020-01-20 LAB — POCT URINALYSIS DIPSTICK OB
Bilirubin, UA: NEGATIVE
Blood, UA: NEGATIVE
Glucose, UA: NEGATIVE
Ketones, UA: NEGATIVE
Nitrite, UA: NEGATIVE
Spec Grav, UA: 1.02 (ref 1.010–1.025)
Urobilinogen, UA: 0.2 E.U./dL
pH, UA: 6.5 (ref 5.0–8.0)

## 2020-01-20 NOTE — Progress Notes (Signed)
Admission orders placed, see chart.    Natalie Beck, CNM [facility], [facility] 01/20/20 4:03 PM

## 2020-01-20 NOTE — Patient Instructions (Signed)
Balloon Catheter Placement for Cervical Ripening Balloon catheter placement for cervical ripening is a procedure to help your cervix start to soften (ripen) and open (dilate). It is done to prepare your body for labor induction. During this procedure, a thin tube (catheter) is placed through your cervix. A tiny balloon attached to the catheter is inflated with water. Pressure from the balloon is what helps your cervix start to open. Cervical ripening with a balloon catheter can make labor induction shorter and easier. You may have this procedure if:  Your cervix is not ready for labor.  Your health care provider has planned labor induction.  You are not having twins or multiples.  Your baby is in the head-down position.  You do not have any other pregnancy complications that require you to be monitored in the hospital after balloon catheter placement. If your health care provider has recommended labor induction to stimulate a vaginal birth, this procedure may be started the day before induction. You will go home with the balloon in place and return to start induction in 12-24 hours. You may have this procedure and stay in the hospital so that your progress can be monitored as well. Tell a health care provider about:  Any allergies you have.  All medicines you are taking, including vitamins, herbs, eye drops, creams, and over-the-counter medicines.  Any blood disorders you have.  Any surgeries you have had.  Any medical conditions you have. What are the risks? Generally, this is a safe procedure. However, problems may occur, including:  Infection.  Bleeding.  Cramping or pain.  Difficulty passing urine.  The baby moving from the head-down position to a position with the feet or buttocks down (breech position). What happens before the procedure?  Your health care provider may check your baby's heartbeat (fetal monitoring) before the procedure.  You may be asked to empty your  bladder. What happens during the procedure?   You will be positioned on the exam table as if you were having a pelvic exam or Pap test.  Your health care provider may insert a medical instrument into your vagina (speculum) to see your cervix.  Your cervix may be cleaned with a germ-killing solution.  The catheter will be inserted through the opening of your cervix.  A balloon on the end of the catheter will be inflated with sterile water. Some catheters have two balloons, one on each side of the cervix.  Depending on the type of balloon catheter, the end of the catheter may be left free outside your cervix or taped to your leg. The procedure may vary among health care providers and hospitals. What can I expect after the procedure?  After the procedure, it is common to have: ? A feeling of pressure inside your vagina. ? Light vaginal bleeding (spotting).  You may have fetal monitoring before going home.  You may be sent home with the catheter in place and asked to return to start your induction in about 12-24 hours. Follow these instructions at home:  Take over-the-counter and prescription medicines only as told by your health care provider.  Return to your normal activities at home as told by your health care provider. Ask your health care provider what activities are safe for you. Do not leave home until you return for labor induction.  You may shower at home. Do not take baths, swim, or use a hot tub unless your health care provider approves.  As your cervix opens, your catheter and balloon may   fall out before you return for labor induction. Ask your health care provider what you should do if this happens.  Keep all follow-up visits as told by your health care provider. This is important. You will need to return to start induction as told by your health care provider. Contact your health care provider if:  You have chills or a fever.  You have constant pain or cramps (not  contractions).  You have trouble passing urine.  Your water breaks.  You have vaginal bleeding that is heavier than spotting.  You have contractions that start to last longer and come closer together (about every 5 minutes).  The balloon catheter falls out before you return to start your induction. Summary  Cervical ripening with placement of a balloon catheter is an outpatient procedure to prepare you for labor induction.  Cervical ripening with a balloon catheter helps your cervix start to open for birth.  You will be positioned on the exam table. The catheter will be inserted through the opening of your cervix. A balloon on the end of the catheter will be inflated with water.  Pressure from the balloon will cause ripening of your cervix. You will go home with the balloon in place and return to start induction in 12-24 hours.  Contact your health care provider if you have pain, fever, vaginal bleeding, or trouble passing urine. Also contact him or her if your water breaks, you start to go into labor, or your balloon catheter falls out before you return to start your induction. This information is not intended to replace advice given to you by your health care provider. Make sure you discuss any questions you have with your health care provider. Document Revised: 12/05/2017 Document Reviewed: 12/05/2017 Elsevier Patient Education  2020 Elsevier Inc.   Labor Induction  Labor induction is when steps are taken to cause a pregnant woman to begin the labor process. Most women go into labor on their own between 37 weeks and 42 weeks of pregnancy. When this does not happen or when there is a medical need for labor to begin, steps may be taken to induce labor. Labor induction causes a pregnant woman's uterus to contract. It also causes the cervix to soften (ripen), open (dilate), and thin out (efface). Usually, labor is not induced before 39 weeks of pregnancy unless there is a medical reason  to do so. Your health care provider will determine if labor induction is needed. Before inducing labor, your health care provider will consider a number of factors, including:  Your medical condition and your baby's.  How many weeks along you are in your pregnancy.  How mature your baby's lungs are.  The condition of your cervix.  The position of your baby.  The size of your birth canal. What are some reasons for labor induction? Labor may be induced if:  Your health or your baby's health is at risk.  Your pregnancy is overdue by 1 week or more.  Your water breaks but labor does not start on its own.  There is a low amount of amniotic fluid around your baby. You may also choose (elect) to have labor induced at a certain time. Generally, elective labor induction is done no earlier than 39 weeks of pregnancy. What methods are used for labor induction? Methods used for labor induction include:  Prostaglandin medicine. This medicine starts contractions and causes the cervix to dilate and ripen. It can be taken by mouth (orally) or by being inserted into   the vagina (suppository).  Inserting a small, thin tube (catheter) with a balloon into the vagina and then expanding the balloon with water to dilate the cervix.  Stripping the membranes. In this method, your health care provider gently separates amniotic sac tissue from the cervix. This causes the cervix to stretch, which in turn causes the release of a hormone called progesterone. The hormone causes the uterus to contract. This procedure is often done during an office visit, after which you will be sent home to wait for contractions to begin.  Breaking the water. In this method, your health care provider uses a small instrument to make a small hole in the amniotic sac. This eventually causes the amniotic sac to break. Contractions should begin after a few hours.  Medicine to trigger or strengthen contractions. This medicine is given  through an IV that is inserted into a vein in your arm. Except for membrane stripping, which can be done in a clinic, labor induction is done in the hospital so that you and your baby can be carefully monitored. How long does it take for labor to be induced? The length of time it takes to induce labor depends on how ready your body is for labor. Some inductions can take up to 2-3 days, while others may take less than a day. Induction may take longer if:  You are induced early in your pregnancy.  It is your first pregnancy.  Your cervix is not ready. What are some risks associated with labor induction? Some risks associated with labor induction include:  Changes in fetal heart rate, such as being too high, too low, or irregular (erratic).  Failed induction.  Infection in the mother or the baby.  Increased risk of having a cesarean delivery.  Fetal death.  Breaking off (abruption) of the placenta from the uterus (rare).  Rupture of the uterus (very rare). When induction is needed for medical reasons, the benefits of induction generally outweigh the risks. What are some reasons for not inducing labor? Labor induction should not be done if:  Your baby does not tolerate contractions.  You have had previous surgeries on your uterus, such as a myomectomy, removal of fibroids, or a vertical scar from a previous cesarean delivery.  Your placenta lies very low in your uterus and blocks the opening of the cervix (placenta previa).  Your baby is not in a head-down position.  The umbilical cord drops down into the birth canal in front of the baby.  There are unusual circumstances, such as the baby being very early (premature).  You have had more than 2 previous cesarean deliveries. Summary  Labor induction is when steps are taken to cause a pregnant woman to begin the labor process.  Labor induction causes a pregnant woman's uterus to contract. It also causes the cervix to ripen,  dilate, and efface.  Labor is not induced before 39 weeks of pregnancy unless there is a medical reason to do so.  When induction is needed for medical reasons, the benefits of induction generally outweigh the risks. This information is not intended to replace advice given to you by your health care provider. Make sure you discuss any questions you have with your health care provider. Document Revised: 04/11/2017 Document Reviewed: 05/22/2016 Elsevier Patient Education  2020 Elsevier Inc.   Fetal Movement Counts Patient Name: ________________________________________________ Patient Due Date: ____________________ What is a fetal movement count?  A fetal movement count is the number of times that you feel your baby  move during a certain amount of time. This may also be called a fetal kick count. A fetal movement count is recommended for every pregnant woman. You may be asked to start counting fetal movements as early as week 28 of your pregnancy. Pay attention to when your baby is most active. You may notice your baby's sleep and wake cycles. You may also notice things that make your baby move more. You should do a fetal movement count:  When your baby is normally most active.  At the same time each day. A good time to count movements is while you are resting, after having something to eat and drink. How do I count fetal movements? 1. Find a quiet, comfortable area. Sit, or lie down on your side. 2. Write down the date, the start time and stop time, and the number of movements that you felt between those two times. Take this information with you to your health care visits. 3. Write down your start time when you feel the first movement. 4. Count kicks, flutters, swishes, rolls, and jabs. You should feel at least 10 movements. 5. You may stop counting after you have felt 10 movements, or if you have been counting for 2 hours. Write down the stop time. 6. If you do not feel 10 movements in 2  hours, contact your health care provider for further instructions. Your health care provider may want to do additional tests to assess your baby's well-being. Contact a health care provider if:  You feel fewer than 10 movements in 2 hours.  Your baby is not moving like he or she usually does. Date: ____________ Start time: ____________ Stop time: ____________ Movements: ____________ Date: ____________ Start time: ____________ Stop time: ____________ Movements: ____________ Date: ____________ Start time: ____________ Stop time: ____________ Movements: ____________ Date: ____________ Start time: ____________ Stop time: ____________ Movements: ____________ Date: ____________ Start time: ____________ Stop time: ____________ Movements: ____________ Date: ____________ Start time: ____________ Stop time: ____________ Movements: ____________ Date: ____________ Start time: ____________ Stop time: ____________ Movements: ____________ Date: ____________ Start time: ____________ Stop time: ____________ Movements: ____________ Date: ____________ Start time: ____________ Stop time: ____________ Movements: ____________ This information is not intended to replace advice given to you by your health care provider. Make sure you discuss any questions you have with your health care provider. Document Revised: 11/26/2018 Document Reviewed: 11/26/2018 Elsevier Patient Education  2020 [employer].

## 2020-01-20 NOTE — Progress Notes (Signed)
ROB-Reports [facility] and increased swelling. Discussed home treatment measures. Pelvimetry completed. Offer IOL at 39 weeks due to obesity and desire for vaginal birth, patient agreed to plan scheduled for Monday at 0800. NST reactive, see chart. Reviewed red flag symptoms and when to call. Reports to [facility] for induction of labor or sooner if needed.

## 2020-01-21 DIAGNOSIS — Z419 Encounter for procedure for purposes other than remedying health state, unspecified: Secondary | ICD-10-CM | POA: Diagnosis not present

## 2020-01-24 ENCOUNTER — Encounter: Payer: Self-pay | Admitting: Certified Nurse Midwife

## 2020-01-24 ENCOUNTER — Inpatient Hospital Stay
Admission: RE | Admit: 2020-01-24 | Discharge: 2020-01-26 | DRG: 786 | Disposition: A | Discharge status: Other Acute Inpatient Hospital | Payer: Medicaid Other | Attending: Certified Nurse Midwife | Admitting: Certified Nurse Midwife

## 2020-01-24 ENCOUNTER — Other Ambulatory Visit: Payer: Self-pay

## 2020-01-24 DIAGNOSIS — J45909 Unspecified asthma, uncomplicated: Secondary | ICD-10-CM | POA: Diagnosis present

## 2020-01-24 DIAGNOSIS — O99214 Obesity complicating childbirth: Secondary | ICD-10-CM | POA: Diagnosis not present

## 2020-01-24 DIAGNOSIS — Y763 Surgical instruments, materials and obstetric and gynecological devices (including sutures) associated with adverse incidents: Secondary | ICD-10-CM

## 2020-01-24 DIAGNOSIS — O9902 Anemia complicating childbirth: Secondary | ICD-10-CM | POA: Diagnosis present

## 2020-01-24 DIAGNOSIS — O411230 Chorioamnionitis, third trimester, not applicable or unspecified: Secondary | ICD-10-CM | POA: Diagnosis not present

## 2020-01-24 DIAGNOSIS — O99824 Streptococcus B carrier state complicating childbirth: Secondary | ICD-10-CM | POA: Diagnosis not present

## 2020-01-24 DIAGNOSIS — Z3A39 39 weeks gestation of pregnancy: Secondary | ICD-10-CM

## 2020-01-24 DIAGNOSIS — O34211 Maternal care for low transverse scar from previous cesarean delivery: Secondary | ICD-10-CM | POA: Diagnosis not present

## 2020-01-24 DIAGNOSIS — O9952 Diseases of the respiratory system complicating childbirth: Secondary | ICD-10-CM | POA: Diagnosis not present

## 2020-01-24 DIAGNOSIS — Z051 Observation and evaluation of newborn for suspected infectious condition ruled out: Secondary | ICD-10-CM | POA: Diagnosis not present

## 2020-01-24 DIAGNOSIS — D649 Anemia, unspecified: Secondary | ICD-10-CM | POA: Diagnosis not present

## 2020-01-24 DIAGNOSIS — Z8759 Personal history of other complications of pregnancy, childbirth and the puerperium: Secondary | ICD-10-CM

## 2020-01-24 DIAGNOSIS — Z3490 Encounter for supervision of normal pregnancy, unspecified, unspecified trimester: Secondary | ICD-10-CM | POA: Diagnosis present

## 2020-01-24 DIAGNOSIS — E669 Obesity, unspecified: Secondary | ICD-10-CM | POA: Diagnosis not present

## 2020-01-24 DIAGNOSIS — Z87891 Personal history of nicotine dependence: Secondary | ICD-10-CM

## 2020-01-24 DIAGNOSIS — Z20822 Contact with and (suspected) exposure to covid-19: Secondary | ICD-10-CM | POA: Diagnosis present

## 2020-01-24 LAB — COMPREHENSIVE METABOLIC PANEL
ALT: 14 U/L (ref 0–44)
AST: 20 U/L (ref 15–41)
Albumin: 3.1 g/dL — ABNORMAL LOW (ref 3.5–5.0)
Alkaline Phosphatase: 151 U/L — ABNORMAL HIGH (ref 38–126)
Anion gap: 11 (ref 5–15)
BUN: 7 mg/dL (ref 6–20)
CO2: 19 mmol/L — ABNORMAL LOW (ref 22–32)
Calcium: 8.9 mg/dL (ref 8.9–10.3)
Chloride: 105 mmol/L (ref 98–111)
Creatinine, Ser: 0.59 mg/dL (ref 0.44–1.00)
GFR calc Af Amer: >60 mL/min (ref >60)
GFR calc non Af Amer: >60 mL/min (ref >60)
Glucose, Bld: 82 mg/dL (ref 70–99)
Potassium: 3.9 mmol/L (ref 3.5–5.1)
Sodium: 135 mmol/L (ref 135–145)
Total Bilirubin: 0.4 mg/dL (ref 0.3–1.2)
Total Protein: 6.5 g/dL (ref 6.5–8.1)

## 2020-01-24 LAB — RESPIRATORY PANEL BY RT PCR (FLU A&B, COVID)
Influenza A by PCR: NEGATIVE
Influenza B by PCR: NEGATIVE
SARS Coronavirus 2 by RT PCR: NEGATIVE

## 2020-01-24 LAB — CBC
HCT: 33.7 % — ABNORMAL LOW (ref 36.0–46.0)
Hemoglobin: 11.2 g/dL — ABNORMAL LOW (ref 12.0–15.0)
MCH: 31.1 pg (ref 26.0–34.0)
MCHC: 33.2 g/dL (ref 30.0–36.0)
MCV: 93.6 fL (ref 80.0–100.0)
Platelets: 156 10*3/uL (ref 150–400)
RBC: 3.6 MIL/uL — ABNORMAL LOW (ref 3.87–5.11)
RDW: 15.5 % (ref 11.5–15.5)
WBC: 10 10*3/uL (ref 4.0–10.5)
nRBC: 0 % (ref 0.0–0.2)

## 2020-01-24 LAB — TYPE AND SCREEN
ABO/RH(D): O POS
Antibody Screen: NEGATIVE

## 2020-01-24 MED ORDER — SODIUM CHLORIDE 0.9% FLUSH: 3.0000 mL | Freq: Two times a day (BID) | INTRAVENOUS | Status: DC

## 2020-01-24 MED ORDER — ONDANSETRON HCL 4 MG/2ML IJ SOLN
4.0000 mg | Freq: Four times a day (QID) | INTRAMUSCULAR | Status: DC | PRN
  Administered 2020-01-24 – 2020-01-25 (×2): 4 mg via INTRAVENOUS
  Filled 2020-01-24 (×2): qty 2

## 2020-01-24 MED ORDER — OXYTOCIN BOLUS FROM INFUSION: 333.0000 mL | Freq: Once | INTRAVENOUS | Status: DC

## 2020-01-24 MED ORDER — TERBUTALINE SULFATE 1 MG/ML IJ SOLN: 0.2500 mg | Freq: Once | INTRAMUSCULAR | Status: DC | PRN

## 2020-01-24 MED ORDER — SOD CITRATE-CITRIC ACID 500-334 MG/5ML PO SOLN: 30.0000 mL | ORAL | Status: DC | PRN

## 2020-01-24 MED ORDER — OXYCODONE-ACETAMINOPHEN 5-325 MG PO TABS: 2.0000 | ORAL_TABLET | ORAL | Status: DC | PRN

## 2020-01-24 MED ORDER — BUTORPHANOL TARTRATE 1 MG/ML IJ SOLN: 1.0000 mg | INTRAMUSCULAR | Status: DC | PRN

## 2020-01-24 MED ORDER — LIDOCAINE HCL (PF) 1 % IJ SOLN
INTRAMUSCULAR | Status: AC
  Filled 2020-01-24: qty 30

## 2020-01-24 MED ORDER — LACTATED RINGERS IV SOLN
500.0000 mL | INTRAVENOUS | Status: DC | PRN
  Administered 2020-01-25: 500 mL via INTRAVENOUS

## 2020-01-24 MED ORDER — ACETAMINOPHEN 325 MG PO TABS
650.0000 mg | ORAL_TABLET | ORAL | Status: DC | PRN
  Administered 2020-01-25: 650 mg via ORAL
  Filled 2020-01-24: qty 2

## 2020-01-24 MED ORDER — OXYTOCIN-SODIUM CHLORIDE 30-0.9 UT/500ML-% IV SOLN
2.5000 [IU]/h | INTRAVENOUS | Status: DC
  Filled 2020-01-24: qty 500

## 2020-01-24 MED ORDER — COCONUT OIL OIL
TOPICAL_OIL | Status: AC
  Filled 2020-01-24: qty 120

## 2020-01-24 MED ORDER — OXYTOCIN 10 UNIT/ML IJ SOLN
INTRAMUSCULAR | Status: AC
  Filled 2020-01-24: qty 2

## 2020-01-24 MED ORDER — LIDOCAINE HCL (PF) 1 % IJ SOLN: 30.0000 mL | INTRAMUSCULAR | Status: DC | PRN

## 2020-01-24 MED ORDER — AMMONIA AROMATIC IN INHA
RESPIRATORY_TRACT | Status: AC
  Filled 2020-01-24: qty 10

## 2020-01-24 MED ORDER — OXYCODONE-ACETAMINOPHEN 5-325 MG PO TABS: 1.0000 | ORAL_TABLET | ORAL | Status: DC | PRN

## 2020-01-24 MED ORDER — ZOLPIDEM TARTRATE 5 MG PO TABS: 5.0000 mg | ORAL_TABLET | Freq: Every evening | ORAL | Status: DC | PRN

## 2020-01-24 MED ORDER — SODIUM CHLORIDE 0.9 % IV SOLN
5.0000 10*6.[IU] | Freq: Once | INTRAVENOUS | Status: AC
  Administered 2020-01-25: 5 10*6.[IU] via INTRAVENOUS
  Filled 2020-01-24: qty 5

## 2020-01-24 MED ORDER — SODIUM CHLORIDE 0.9% FLUSH: 3.0000 mL | INTRAVENOUS | Status: DC | PRN

## 2020-01-24 MED ORDER — SODIUM CHLORIDE 0.9 % IV SOLN: 250.0000 mL | INTRAVENOUS | Status: DC | PRN

## 2020-01-24 MED ORDER — OXYTOCIN-SODIUM CHLORIDE 30-0.9 UT/500ML-% IV SOLN
1.0000 m[IU]/min | INTRAVENOUS | Status: DC
  Administered 2020-01-24: 1 m[IU]/min via INTRAVENOUS
  Administered 2020-01-25: 4 m[IU]/min via INTRAVENOUS
  Filled 2020-01-24: qty 1000
  Filled 2020-01-24: qty 500

## 2020-01-24 MED ORDER — BREAST MILK/FORMULA (FOR LABEL PRINTING ONLY): ORAL | Status: DC

## 2020-01-24 MED ORDER — PENICILLIN G POT IN DEXTROSE 60000 UNIT/ML IV SOLN
3.0000 10*6.[IU] | INTRAVENOUS | Status: DC
  Administered 2020-01-25 (×5): 3 10*6.[IU] via INTRAVENOUS
  Filled 2020-01-24 (×5): qty 50

## 2020-01-24 MED ORDER — MISOPROSTOL 200 MCG PO TABS
ORAL_TABLET | ORAL | Status: AC
  Filled 2020-01-24: qty 4

## 2020-01-24 MED ORDER — LACTATED RINGERS IV SOLN: INTRAVENOUS | Status: DC

## 2020-01-24 NOTE — Progress Notes (Signed)
Patient doing nipple stimulation per CNM verbal order after placement of cook catheter. CNM states to start penicillin after cook catheter has been removed. CNM reports patient can be saline locked so patient is currently saline locked. CNM reviewed FHR strip and plan of care reviewed.

## 2020-01-24 NOTE — Progress Notes (Signed)
Ch visited with Pt to pass on AD document as requested by the patient.

## 2020-01-24 NOTE — H&P (Signed)
Obstetric History and Physical  Natalie Beck is a 29 y.o. G2P1001 with IUP at [redacted]w[redacted]d presenting for induction of labor due to morbid obesity.   Patient states she has been having occasional contractions, none vaginal bleeding, intact membranes, with active fetal movement.    Denies difficulty breathing or respiratory distress, chest pain, abdominal pain, vaginal bleeding, dysuria, and leg pain or swelling.   Prenatal Course  Source of Care: [facility]-initial visit at 6 wks, total visits: 13  Pregnancy complications or risks: Obesity in pregnancy, Vitamin D deficiency, History of previous cesarean section-desires vaginal birth, GBS positive  Prenatal labs and studies:  ABO, Rh: --/--/O POS (10/04 1044)  Antibody: NEG (10/04 1044)  Rubella: 5.77 (02/16 0854)  RPR: Non Reactive (07/02 1033)   HBsAg: Negative (02/16 0854)   HIV: Non Reactive (02/16 0854)   GBS: Positive (08/30 1223)  1 hr Glucola: 92 (07/02 1033)  Genetic screening: Low risk female  Anatomy US: Complete, normal  Past Medical History:  Diagnosis Date   Anemia    Asthma     Past Surgical History:  Procedure Laterality Date   CESAREAN SECTION N/A 08/17/2017   Procedure: CESAREAN SECTION;  Surgeon: Linzie Collin, MD;  Location: [facility] ORS;  Service: Obstetrics;  Laterality: N/A;   dental implant     WISDOM TOOTH EXTRACTION      OB History  Gravida Para Term Preterm AB Living  2 1 1     1   SAB TAB Ectopic Multiple Live Births        0 1    # Outcome Date GA Lbr Len/2nd Weight Sex Delivery Anes PTL Lv  2 Current           1 Term 08/17/17 [redacted]w[redacted]d  2890 g F CS-LTranv Spinal  LIV     Complications: Fetal Intolerance    Social History   Socioeconomic History   Marital status: Significant Other    Spouse name: [id number] Sheria Lang)   Number of children: 1   Years of education: Not on file   Highest education level: Not on file  Occupational History   Not on file  Tobacco Use   Smoking  status: Former Smoker    Packs/day: 0.50    Types: Cigarettes    Quit date: 01/23/2017    Years since quitting: 3.0   Smokeless tobacco: Never Used   Tobacco comment: Already quit  Vaping Use   Vaping Use: Former  Substance and Sexual Activity   Alcohol use: Not Currently    Alcohol/week: 2.0 standard drinks    Types: 2 Standard drinks or equivalent per week   Drug use: No   Sexual activity: Yes    Partners: Male    Birth control/protection: None  Other Topics Concern   Not on file  Social History Narrative   Not on file   Social Determinants of Health   Financial Resource Strain:    Difficulty of Paying Living Expenses: Not on file  Food Insecurity:    Worried About 03/25/2017 in the Last Year: Not on file   Programme researcher, broadcasting/film/video of Food in the Last Year: Not on file  Transportation Needs:    Lack of Transportation (Medical): Not on file   Lack of Transportation (Non-Medical): Not on file  Physical Activity:    Days of Exercise per Week: Not on file   Minutes of Exercise per Session: Not on file  Stress:    Feeling of Stress : Not on  file  Social Connections:    Frequency of Communication with Friends and Family: Not on file   Frequency of Social Gatherings with Friends and Family: Not on file   Attends Religious Services: Not on file   Active Member of Clubs or Organizations: Not on file   Attends Banker Meetings: Not on file   Marital Status: Not on file    Family History  Problem Relation Age of Onset   Asthma Father    Asthma Sister    Hypertension Sister    Rheum arthritis Paternal Grandmother    Cancer Paternal Grandmother        lung   Cirrhosis Maternal Grandmother    Cancer Other        maternal great grandmother-leukemia   Cancer Paternal Grandfather    Breast cancer Neg Hx    Ovarian cancer Neg Hx     Facility-Administered Medications Prior to Admission  Medication Dose Route Frequency Provider Last  Rate Last Admin   Tdap (BOOSTRIX) injection 0.5 mL  0.5 mL Intramuscular Once Gunnar Bulla, CNM       Medications Prior to Admission  Medication Sig Dispense Refill Last Dose   aspirin EC 81 MG tablet Take 1 tablet (81 mg total) by mouth daily. Take after 12 weeks for prevention of preeclampssia later in pregnancy 300 tablet 2 01/23/2020 at Unknown time   loratadine-pseudoephedrine (CLARITIN-D 24 HOUR) 10-240 MG 24 hr tablet Take by mouth.   01/23/2020 at Unknown time   OVER THE COUNTER MEDICATION Take 1 tablet by mouth 3 (three) times daily. Sunflower Lithosyine   Past Week at Unknown time   Prenatal Vit-Fe Fumarate-FA (MULTIVITAMIN-PRENATAL) 27-0.8 MG TABS tablet Take 1 tablet by mouth daily at 12 noon.   01/23/2020 at Unknown time   Vitamin D, Ergocalciferol, (DRISDOL) 1.25 MG (50000 UNIT) CAPS capsule Take 1 capsule (50,000 Units total) by mouth every 7 (seven) days. 14 capsule 2 Past Week at Unknown time   albuterol (PROVENTIL HFA;VENTOLIN HFA) 108 (90 Base) MCG/ACT inhaler Inhale into the lungs every 6 (six) hours as needed for wheezing or shortness of breath. (Patient not taking: Reported on 01/24/2020)   Not Taking at Unknown time    Allergies  Allergen Reactions   Citrus Dermatitis    Hypoallergenic dermatitis-tongue swells and itches   Other     Pet dander    Review of Systems: Negative except for what is mentioned in HPI.  Physical Exam:  Temp:  [98.8 F (37.1 C)] 98.8 F (37.1 C) (10/04 0947) Pulse Rate:  [77] 77 (10/04 0947) Resp:  [16] 16 (10/04 0947) BP: (118)/(71) 118/71 (10/04 0947) SpO2:  [97 %] 97 % (10/04 0947) Weight:  [116.1 kg] 116.1 kg (10/04 0947)  GENERAL: Well-developed, well-nourished female in no acute distress.   LUNGS: Clear to auscultation bilaterally.   HEART: Regular rate and rhythm.  ABDOMEN: Soft, nontender, nondistended, gravid.  EXTREMITIES: Nontender, no edema, 2+ distal pulses.  Cervical Exam: Dilation:  1 Effacement (%): 50 Exam by:: Serafina Royals, CNM   FHR Category I  Contractions: Irregular, soft resting tone   Pertinent Labs/Studies:   Results for orders placed or performed during the hospital encounter of 01/24/20 (from the past 24 hour(s))  CBC     Status: Abnormal   Collection Time: 01/24/20  9:28 AM  Result Value Ref Range   WBC 10.0 4.0 - 10.5 K/uL   RBC 3.60 (L) 3.87 - 5.11 MIL/uL   Hemoglobin 11.2 (L) 12.0 -  15.0 g/dL   HCT 65.4 (L) 36 - 46 %   MCV 93.6 80.0 - 100.0 fL   MCH 31.1 26.0 - 34.0 pg   MCHC 33.2 30.0 - 36.0 g/dL   RDW 65.0 35.4 - 65.6 %   Platelets 156 150 - 400 K/uL   nRBC 0.0 0.0 - 0.2 %  Comprehensive metabolic panel     Status: Abnormal   Collection Time: 01/24/20  9:28 AM  Result Value Ref Range   Sodium 135 135 - 145 mmol/L   Potassium 3.9 3.5 - 5.1 mmol/L   Chloride 105 98 - 111 mmol/L   CO2 19 (L) 22 - 32 mmol/L   Glucose, Bld 82 70 - 99 mg/dL   BUN 7 6 - 20 mg/dL   Creatinine, Ser 8.12 0.44 - 1.00 mg/dL   Calcium 8.9 8.9 - 75.1 mg/dL   Total Protein 6.5 6.5 - 8.1 g/dL   Albumin 3.1 (L) 3.5 - 5.0 g/dL   AST 20 15 - 41 U/L   ALT 14 0 - 44 U/L   Alkaline Phosphatase 151 (H) 38 - 126 U/L   Total Bilirubin 0.4 0.3 - 1.2 mg/dL   GFR calc non Af Amer >60 >60 mL/min   GFR calc Af Amer >60 >60 mL/min   Anion gap 11 5 - 15  Respiratory Panel by RT PCR (Flu A&B, Covid) - Nasopharyngeal Swab     Status: None   Collection Time: 01/24/20  9:28 AM   Specimen: Nasopharyngeal Swab  Result Value Ref Range   SARS Coronavirus 2 by RT PCR NEGATIVE NEGATIVE   Influenza A by PCR NEGATIVE NEGATIVE   Influenza B by PCR NEGATIVE NEGATIVE  Type and screen     Status: None   Collection Time: 01/24/20 10:44 AM  Result Value Ref Range   ABO/RH(D) O POS    Antibody Screen NEG    Sample Expiration      01/27/2020,2359 Performed at [facility], [address], [place], [place] [address]     Assessment :  Natalie Beck is a 29 y.o.  G2P1001 at [redacted]w[redacted]d being admitted for induction of labor due to morbid obesity, history of cesarean section, Rh positive, GBS positive  FHR Category I  Plan:  Admit to birthing suites, see orders.   Induction/Augmentation per protocol, patient to start with nipple stimulation via EDBP.   Cook's catheter placed without difficulty and secured to leg, 80/80.   Delivery plan: Hopeful for vaginal birth.   Dr. Valentino Saxon and anesthesiologist on call aware of admission and plan of care.    Gunnar Bulla, CNM [facility] 01/24/20 1:17 PM

## 2020-01-24 NOTE — Progress Notes (Signed)
At bedside with RN at 1642 for toco and [place] adjustment. Pt. in hands and knees position doing the [employer]. Difficulty assessing fetal heart rate and [place] adjusted regularly. CNM gives order to DC monitoring in this position.

## 2020-01-24 NOTE — Lactation Note (Signed)
Mom plans to augment labor with pumping.  Symphony set up in room with instructions in hand expressing, pumping, collection, storage, labeling, giving and handling of expressed milk.  Plan is to pump every hour for 15 to 20 minutes to initiate labor contractions. Lactation name and number written on white board and encouraged to call with any questions, concerns or assistance.

## 2020-01-24 NOTE — Progress Notes (Addendum)
Consult for Advance Directives. Spoke with RN who reported patient does not want a Chaplain which is who typically does Merchant navy officer. Per RN, patient would like printed materials. CSW asked Chaplain if they have any printed materials we could provide to patient.  2:00- Chaplain Cristal Deer will drop off paperwork for patient to review about Advance Directives.   Please re-consult CSW if additional needs/concerns arise.  Alfonso Ramus, [place] [phone]

## 2020-01-24 NOTE — Progress Notes (Signed)
Patient ID: [name], female   DOB: Aug 24, 1990, 29 y.o.   MRN: [id number]   Natalie Beck is a 29 y.o. G2P1001 at [redacted]w[redacted]d by ultrasound admitted for induction of labor due to obesity.  Subjective:  Patient sitting on birth stool, breathing through contractions. FOB at bedside for continuous labor support.   Denies difficulty breathing or respiratory distress, chest pain, dysuria, and leg pain.   Objective:  Temp:  [98.3 F (36.8 C)-98.8 F (37.1 C)] 98.3 F (36.8 C) (10/04 1434) Pulse Rate:  [77-84] 84 (10/04 1434) Resp:  [16] 16 (10/04 1434) BP: (118-120)/(71-72) 120/72 (10/04 1434) SpO2:  [97 %] 97 % (10/04 0947) Weight:  [116.1 kg] 116.1 kg (10/04 0947)  Fetal Wellbeing:  Category I  UC: occasional contractions, soft resting tone  SVE:   Dilation: 1 Effacement (%): 50 Exam by:: Serafina Royals, CNM  Labs: Lab Results  Component Value Date   WBC 10.0 01/24/2020   HGB 11.2 (L) 01/24/2020   HCT 33.7 (L) 01/24/2020   MCV 93.6 01/24/2020   PLT 156 01/24/2020    Assessment:  Natalie Beck is a 29 y.o. G2P1001 at [redacted]w[redacted]d admitted for induction of labor due to morbid obesity, history of cesarean section, Rh positive, GBS positive  FHR Category I  Plan:  Encouraged position change and use of peanut ball.   Reviewed red flag symptoms and when to call.   Continue orders as written. Reassess as needed.    Serafina Royals  [facility], [facility] 01/24/2020, 5:57 PM

## 2020-01-24 NOTE — Progress Notes (Signed)
Patient ID: Natalie Beck, female   DOB: 1991/03/16, 29 y.o.   MRN: [id number]  Natalie Beck is a 29 y.o. G2P1001 at [redacted]w[redacted]d by ultrasound admitted for induction of labor due to morbid obesity.  Subjective:  Patient standing in room, reports irregular contractions. FOB at bedside for continuous labor support.   Denies difficulty breathing or respiratory distress, chest pain, dysuria, and leg pain.   Objective:  Temp:  [98.3 F (36.8 C)-98.8 F (37.1 C)] 98.3 F (36.8 C) (10/04 1434) Pulse Rate:  [77-84] 84 (10/04 1434) Resp:  [16] 16 (10/04 1434) BP: (118-120)/(71-72) 120/72 (10/04 1434) SpO2:  [97 %] 97 % (10/04 0947) Weight:  [116.1 kg] 116.1 kg (10/04 0947)  Fetal Wellbeing:  Category I  UC:   irregular, every two (2) to five (5) minutes; soft resting tone  SVE:   Dilation: 1 Effacement (%): 50 Exam by:: Serafina Royals, CNM  Labs: Lab Results  Component Value Date   WBC 10.0 01/24/2020   HGB 11.2 (L) 01/24/2020   HCT 33.7 (L) 01/24/2020   MCV 93.6 01/24/2020   PLT 156 01/24/2020    Assessment:  Natalie Beck is a 29 y.o. G2P1001 at [redacted]w[redacted]d admitted for induction of labor due to morbid obesity, history of cesarean section, Rh positive, GBS positive  FHR Category I  Plan:  Discussed induction management options, patient agrees to titration of IV pitocin.   Encouraged position change and use of non-pharmacologic pain management techniques.   Reviewed red flag symptoms and when to call.   Continue orders as written. Reassess as needed.    Serafina Royals  [facility], [facility] 01/24/2020, 6:57 PM

## 2020-01-24 NOTE — Progress Notes (Signed)
Anesthesia made aware of TOLAC. Will continue to monitor and communicate notable updates.

## 2020-01-25 ENCOUNTER — Encounter: Payer: Self-pay | Admitting: Certified Nurse Midwife

## 2020-01-25 ENCOUNTER — Encounter
Admission: RE | Disposition: A | Discharge status: Other Acute Inpatient Hospital | Payer: Self-pay | Source: Home / Self Care | Attending: Certified Nurse Midwife

## 2020-01-25 ENCOUNTER — Inpatient Hospital Stay: Payer: Medicaid Other | Admitting: Anesthesiology

## 2020-01-25 ENCOUNTER — Inpatient Hospital Stay: Payer: Medicaid Other

## 2020-01-25 DIAGNOSIS — R569 Unspecified convulsions: Secondary | ICD-10-CM | POA: Diagnosis not present

## 2020-01-25 DIAGNOSIS — O34211 Maternal care for low transverse scar from previous cesarean delivery: Secondary | ICD-10-CM

## 2020-01-25 DIAGNOSIS — Z452 Encounter for adjustment and management of vascular access device: Secondary | ICD-10-CM | POA: Diagnosis not present

## 2020-01-25 DIAGNOSIS — O411230 Chorioamnionitis, third trimester, not applicable or unspecified: Secondary | ICD-10-CM | POA: Diagnosis not present

## 2020-01-25 DIAGNOSIS — O99214 Obesity complicating childbirth: Principal | ICD-10-CM

## 2020-01-25 DIAGNOSIS — E669 Obesity, unspecified: Secondary | ICD-10-CM

## 2020-01-25 DIAGNOSIS — O99824 Streptococcus B carrier state complicating childbirth: Secondary | ICD-10-CM

## 2020-01-25 DIAGNOSIS — Z3A39 39 weeks gestation of pregnancy: Secondary | ICD-10-CM

## 2020-01-25 DIAGNOSIS — Z4682 Encounter for fitting and adjustment of non-vascular catheter: Secondary | ICD-10-CM | POA: Diagnosis not present

## 2020-01-25 LAB — RPR: RPR Ser Ql: NONREACTIVE

## 2020-01-25 SURGERY — Surgical Case
Anesthesia: Epidural

## 2020-01-25 MED ORDER — GENTAMICIN SULFATE 40 MG/ML IJ SOLN
360.0000 mg | INTRAVENOUS | Status: DC
  Administered 2020-01-25 – 2020-01-26 (×2): 360 mg via INTRAVENOUS
  Filled 2020-01-25 (×2): qty 9

## 2020-01-25 MED ORDER — LIDOCAINE HCL (PF) 2 % IJ SOLN
INTRAMUSCULAR | Status: AC
  Filled 2020-01-25: qty 10

## 2020-01-25 MED ORDER — SODIUM CHLORIDE 0.9 % IV SOLN
INTRAVENOUS | Status: DC | PRN
  Administered 2020-01-25: 50 ug/min via INTRAVENOUS

## 2020-01-25 MED ORDER — NALOXONE HCL 0.4 MG/ML IJ SOLN: 0.4000 mg | INTRAMUSCULAR | Status: DC | PRN

## 2020-01-25 MED ORDER — MORPHINE SULFATE (PF) 0.5 MG/ML IJ SOLN
INTRAMUSCULAR | Status: AC
  Filled 2020-01-25: qty 10

## 2020-01-25 MED ORDER — WITCH HAZEL-GLYCERIN EX PADS: 1.0000 "application " | MEDICATED_PAD | CUTANEOUS | Status: DC | PRN

## 2020-01-25 MED ORDER — OXYCODONE HCL 5 MG PO TABS: 5.0000 mg | ORAL_TABLET | ORAL | Status: DC | PRN

## 2020-01-25 MED ORDER — SIMETHICONE 80 MG PO CHEW
80.0000 mg | CHEWABLE_TABLET | ORAL | Status: DC | PRN
  Administered 2020-01-26: 80 mg via ORAL
  Filled 2020-01-25: qty 1

## 2020-01-25 MED ORDER — KETOROLAC TROMETHAMINE 30 MG/ML IJ SOLN: 30.0000 mg | Freq: Four times a day (QID) | INTRAMUSCULAR | Status: DC

## 2020-01-25 MED ORDER — NALBUPHINE HCL 10 MG/ML IJ SOLN: 5.0000 mg | Freq: Once | INTRAMUSCULAR | Status: DC | PRN

## 2020-01-25 MED ORDER — KETOROLAC TROMETHAMINE 30 MG/ML IJ SOLN
30.0000 mg | Freq: Four times a day (QID) | INTRAMUSCULAR | Status: DC
  Administered 2020-01-26 (×3): 30 mg via INTRAVENOUS
  Filled 2020-01-25 (×3): qty 1

## 2020-01-25 MED ORDER — FENTANYL 2.5 MCG/ML W/ROPIVACAINE 0.15% IN NS 100 ML EPIDURAL (ARMC)
EPIDURAL | Status: DC | PRN
Start: 2020-01-25 — End: 2020-01-25
  Administered 2020-01-25: 12 mL/h via EPIDURAL

## 2020-01-25 MED ORDER — VASOPRESSIN 20 UNIT/ML IV SOLN
INTRAVENOUS | Status: AC
  Filled 2020-01-25: qty 1

## 2020-01-25 MED ORDER — SENNOSIDES-DOCUSATE SODIUM 8.6-50 MG PO TABS
2.0000 | ORAL_TABLET | ORAL | Status: DC
  Administered 2020-01-26: 2 via ORAL
  Filled 2020-01-25: qty 2

## 2020-01-25 MED ORDER — FENTANYL CITRATE (PF) 100 MCG/2ML IJ SOLN
INTRAMUSCULAR | Status: AC
  Filled 2020-01-25: qty 2

## 2020-01-25 MED ORDER — FENTANYL 2.5 MCG/ML W/ROPIVACAINE 0.15% IN NS 100 ML EPIDURAL (ARMC)
EPIDURAL | Status: AC
Start: 2020-01-25 — End: 2020-01-25
  Filled 2020-01-25: qty 100

## 2020-01-25 MED ORDER — BUPIVACAINE LIPOSOME 1.3 % IJ SUSP
INTRAMUSCULAR | Status: AC
  Filled 2020-01-25: qty 20

## 2020-01-25 MED ORDER — MIDAZOLAM HCL 2 MG/2ML IJ SOLN
INTRAMUSCULAR | Status: DC | PRN
  Administered 2020-01-25 (×2): 2 mg via INTRAVENOUS

## 2020-01-25 MED ORDER — OXYCODONE HCL 5 MG PO TABS: 10.0000 mg | ORAL_TABLET | ORAL | Status: DC | PRN

## 2020-01-25 MED ORDER — OXYTOCIN-SODIUM CHLORIDE 30-0.9 UT/500ML-% IV SOLN
INTRAVENOUS | Status: DC | PRN
  Administered 2020-01-25: 500 mL/h via INTRAVENOUS

## 2020-01-25 MED ORDER — ONDANSETRON HCL 4 MG/2ML IJ SOLN
INTRAMUSCULAR | Status: DC | PRN
  Administered 2020-01-25: 4 mg via INTRAVENOUS

## 2020-01-25 MED ORDER — KETOROLAC TROMETHAMINE 30 MG/ML IJ SOLN
30.0000 mg | Freq: Once | INTRAMUSCULAR | Status: AC
  Administered 2020-01-25: 30 mg via INTRAVENOUS
  Filled 2020-01-25: qty 1

## 2020-01-25 MED ORDER — PHENYLEPHRINE 40 MCG/ML (10ML) SYRINGE FOR IV PUSH (FOR BLOOD PRESSURE SUPPORT): 80.0000 ug | PREFILLED_SYRINGE | INTRAVENOUS | Status: DC | PRN

## 2020-01-25 MED ORDER — MORPHINE SULFATE (PF) 0.5 MG/ML IJ SOLN
INTRAMUSCULAR | Status: DC | PRN
  Administered 2020-01-25 (×4): 1 mg via EPIDURAL

## 2020-01-25 MED ORDER — PHENYLEPHRINE HCL (PRESSORS) 10 MG/ML IV SOLN
INTRAVENOUS | Status: DC | PRN
  Administered 2020-01-25 (×2): 200 ug via INTRAVENOUS

## 2020-01-25 MED ORDER — FENTANYL CITRATE (PF) 100 MCG/2ML IJ SOLN
INTRAMUSCULAR | Status: DC | PRN
Start: 2020-01-25 — End: 2020-01-25
  Administered 2020-01-25: 50 ug via INTRAVENOUS
  Administered 2020-01-25: 75 ug via INTRAVENOUS
  Administered 2020-01-25 (×2): 50 ug via INTRAVENOUS
  Administered 2020-01-25: 25 ug via INTRAVENOUS
  Administered 2020-01-25 (×2): 50 ug via INTRAVENOUS

## 2020-01-25 MED ORDER — ZOLPIDEM TARTRATE 5 MG PO TABS: 5.0000 mg | ORAL_TABLET | Freq: Every evening | ORAL | Status: DC | PRN

## 2020-01-25 MED ORDER — ONDANSETRON HCL 4 MG/2ML IJ SOLN: 4.0000 mg | Freq: Three times a day (TID) | INTRAMUSCULAR | Status: DC | PRN

## 2020-01-25 MED ORDER — CALCIUM CARBONATE ANTACID 500 MG PO CHEW
CHEWABLE_TABLET | ORAL | Status: AC
  Administered 2020-01-25: 800 mg via ORAL
  Filled 2020-01-25: qty 4

## 2020-01-25 MED ORDER — TRAMADOL HCL 50 MG PO TABS: 50.0000 mg | ORAL_TABLET | Freq: Four times a day (QID) | ORAL | Status: DC | PRN

## 2020-01-25 MED ORDER — DIBUCAINE (PERIANAL) 1 % EX OINT: 1.0000 "application " | TOPICAL_OINTMENT | CUTANEOUS | Status: DC | PRN

## 2020-01-25 MED ORDER — DEXAMETHASONE SODIUM PHOSPHATE 10 MG/ML IJ SOLN
INTRAMUSCULAR | Status: DC | PRN
  Administered 2020-01-25: 10 mg via INTRAVENOUS

## 2020-01-25 MED ORDER — LIDOCAINE HCL (PF) 2 % IJ SOLN
INTRAMUSCULAR | Status: DC | PRN
  Administered 2020-01-25: 400 mg via EPIDURAL

## 2020-01-25 MED ORDER — FENTANYL 2.5 MCG/ML W/ROPIVACAINE 0.15% IN NS 100 ML EPIDURAL (ARMC)
12.0000 mL/h | EPIDURAL | Status: DC
  Administered 2020-01-25 (×2): 12 mL/h via EPIDURAL
  Filled 2020-01-25 (×2): qty 100

## 2020-01-25 MED ORDER — SCOPOLAMINE 1 MG/3DAYS TD PT72
1.0000 | MEDICATED_PATCH | Freq: Once | TRANSDERMAL | Status: DC
  Administered 2020-01-25: 1.5 mg via TRANSDERMAL
  Filled 2020-01-25: qty 1

## 2020-01-25 MED ORDER — SODIUM CHLORIDE 0.9 % IV SOLN
INTRAVENOUS | Status: DC | PRN
  Administered 2020-01-25 (×3): 5 mL via EPIDURAL

## 2020-01-25 MED ORDER — BUPIVACAINE HCL (PF) 0.5 % IJ SOLN
INTRAMUSCULAR | Status: AC
  Filled 2020-01-25: qty 30

## 2020-01-25 MED ORDER — CALCIUM CARBONATE ANTACID 500 MG PO CHEW: 800.0000 mg | CHEWABLE_TABLET | ORAL | Status: AC

## 2020-01-25 MED ORDER — PROPOFOL 10 MG/ML IV BOLUS
INTRAVENOUS | Status: AC
  Filled 2020-01-25: qty 20

## 2020-01-25 MED ORDER — OXYCODONE HCL 5 MG PO TABS
5.0000 mg | ORAL_TABLET | ORAL | Status: DC | PRN
  Administered 2020-01-26 (×2): 10 mg via ORAL
  Filled 2020-01-25 (×2): qty 2

## 2020-01-25 MED ORDER — LACTATED RINGERS IV SOLN: INTRAVENOUS | Status: DC

## 2020-01-25 MED ORDER — HYDROMORPHONE HCL 1 MG/ML IJ SOLN: 1.0000 mg | INTRAMUSCULAR | Status: DC | PRN

## 2020-01-25 MED ORDER — DIPHENHYDRAMINE HCL 25 MG PO CAPS: 25.0000 mg | ORAL_CAPSULE | Freq: Four times a day (QID) | ORAL | Status: DC | PRN

## 2020-01-25 MED ORDER — MIDAZOLAM HCL 2 MG/2ML IJ SOLN
INTRAMUSCULAR | Status: AC
  Filled 2020-01-25: qty 2

## 2020-01-25 MED ORDER — PRENATAL MULTIVITAMIN CH
1.0000 | ORAL_TABLET | Freq: Every day | ORAL | Status: DC
  Administered 2020-01-26: 1 via ORAL
  Filled 2020-01-25: qty 1

## 2020-01-25 MED ORDER — FENTANYL CITRATE (PF) 100 MCG/2ML IJ SOLN
INTRAMUSCULAR | Status: AC
  Administered 2020-01-25: 25 ug via INTRAVENOUS
  Filled 2020-01-25: qty 2

## 2020-01-25 MED ORDER — FENTANYL CITRATE (PF) 100 MCG/2ML IJ SOLN
25.0000 ug | INTRAMUSCULAR | Status: DC | PRN
  Administered 2020-01-25 (×3): 25 ug via INTRAVENOUS

## 2020-01-25 MED ORDER — CLINDAMYCIN PHOSPHATE 900 MG/50ML IV SOLN
900.0000 mg | Freq: Three times a day (TID) | INTRAVENOUS | Status: DC
  Administered 2020-01-25 – 2020-01-26 (×3): 900 mg via INTRAVENOUS
  Filled 2020-01-25 (×6): qty 50

## 2020-01-25 MED ORDER — FERROUS SULFATE 325 (65 FE) MG PO TABS
325.0000 mg | ORAL_TABLET | Freq: Two times a day (BID) | ORAL | Status: DC
  Administered 2020-01-26 (×2): 325 mg via ORAL
  Filled 2020-01-25 (×2): qty 1

## 2020-01-25 MED ORDER — EPHEDRINE 5 MG/ML INJ: 10.0000 mg | INTRAVENOUS | Status: DC | PRN

## 2020-01-25 MED ORDER — LACTATED RINGERS IV SOLN: INTRAVENOUS | Status: DC | PRN

## 2020-01-25 MED ORDER — SODIUM CHLORIDE (PF) 0.9 % IJ SOLN
INTRAMUSCULAR | Status: AC
  Filled 2020-01-25: qty 50

## 2020-01-25 MED ORDER — NALOXONE HCL 4 MG/10ML IJ SOLN
1.0000 ug/kg/h | INTRAVENOUS | Status: DC | PRN
  Filled 2020-01-25: qty 5

## 2020-01-25 MED ORDER — LIDOCAINE HCL (PF) 1 % IJ SOLN
INTRAMUSCULAR | Status: DC | PRN
  Administered 2020-01-25: 2 mL

## 2020-01-25 MED ORDER — ACETAMINOPHEN 500 MG PO TABS
1000.0000 mg | ORAL_TABLET | Freq: Four times a day (QID) | ORAL | Status: AC
  Administered 2020-01-25 – 2020-01-26 (×4): 1000 mg via ORAL
  Filled 2020-01-25 (×4): qty 2

## 2020-01-25 MED ORDER — LACTATED RINGERS IV SOLN
500.0000 mL | Freq: Once | INTRAVENOUS | Status: AC
  Administered 2020-01-25 (×2): 500 mL via INTRAVENOUS

## 2020-01-25 MED ORDER — MAGNESIUM HYDROXIDE 400 MG/5ML PO SUSP: 30.0000 mL | ORAL | Status: DC | PRN

## 2020-01-25 MED ORDER — MENTHOL 3 MG MT LOZG
1.0000 | LOZENGE | OROMUCOSAL | Status: DC | PRN
  Filled 2020-01-25 (×2): qty 9

## 2020-01-25 MED ORDER — SIMETHICONE 80 MG PO CHEW
80.0000 mg | CHEWABLE_TABLET | ORAL | Status: DC
  Administered 2020-01-26: 80 mg via ORAL
  Filled 2020-01-25: qty 1

## 2020-01-25 MED ORDER — PROPOFOL 10 MG/ML IV BOLUS
INTRAVENOUS | Status: DC | PRN
  Administered 2020-01-25: 150 mg via INTRAVENOUS

## 2020-01-25 MED ORDER — DIPHENHYDRAMINE HCL 50 MG/ML IJ SOLN
12.5000 mg | INTRAMUSCULAR | Status: DC | PRN
  Administered 2020-01-25: 12.5 mg via INTRAVENOUS
  Filled 2020-01-25: qty 1

## 2020-01-25 MED ORDER — SODIUM CHLORIDE 0.9% FLUSH: 3.0000 mL | INTRAVENOUS | Status: DC | PRN

## 2020-01-25 MED ORDER — COCONUT OIL OIL: 1.0000 "application " | TOPICAL_OIL | Status: DC | PRN

## 2020-01-25 MED ORDER — VASOPRESSIN 20 UNIT/ML IV SOLN
INTRAVENOUS | Status: DC | PRN
  Administered 2020-01-25 (×2): 1 [IU] via INTRAVENOUS

## 2020-01-25 MED ORDER — OXYCODONE-ACETAMINOPHEN 5-325 MG PO TABS: 2.0000 | ORAL_TABLET | ORAL | Status: DC | PRN

## 2020-01-25 MED ORDER — NALBUPHINE HCL 10 MG/ML IJ SOLN: 5.0000 mg | INTRAMUSCULAR | Status: DC | PRN

## 2020-01-25 MED ORDER — SUCCINYLCHOLINE CHLORIDE 20 MG/ML IJ SOLN
INTRAMUSCULAR | Status: DC | PRN
  Administered 2020-01-25: 140 mg via INTRAVENOUS

## 2020-01-25 MED ORDER — OXYTOCIN-SODIUM CHLORIDE 30-0.9 UT/500ML-% IV SOLN
2.5000 [IU]/h | INTRAVENOUS | Status: AC
  Administered 2020-01-25 – 2020-01-26 (×2): 2.5 [IU]/h via INTRAVENOUS
  Filled 2020-01-25: qty 500

## 2020-01-25 MED ORDER — IBUPROFEN 800 MG PO TABS: 800.0000 mg | ORAL_TABLET | Freq: Four times a day (QID) | ORAL | Status: DC

## 2020-01-25 MED ORDER — GABAPENTIN 300 MG PO CAPS
300.0000 mg | ORAL_CAPSULE | Freq: Two times a day (BID) | ORAL | Status: DC
  Administered 2020-01-25 – 2020-01-26 (×2): 300 mg via ORAL
  Filled 2020-01-25 (×2): qty 1

## 2020-01-25 MED ORDER — MEPERIDINE HCL 50 MG/ML IJ SOLN: 6.2500 mg | INTRAMUSCULAR | Status: DC | PRN

## 2020-01-25 SURGICAL SUPPLY — 30 items
ADH LQ OCL WTPRF AMP STRL LF (MISCELLANEOUS) ×1
ADHESIVE MASTISOL STRL (MISCELLANEOUS) ×2 IMPLANT
APL PRP STRL LF DISP 70% ISPRP (MISCELLANEOUS) ×2
BAG COUNTER SPONGE EZ (MISCELLANEOUS) ×2 IMPLANT
BAG SPNG 4X4 CLR HAZ (MISCELLANEOUS) ×1
CANISTER SUCT 3000ML PPV (MISCELLANEOUS) ×2 IMPLANT
CHLORAPREP W/TINT 26 (MISCELLANEOUS) ×4 IMPLANT
COVER WAND RF STERILE (DRAPES) ×2 IMPLANT
DRSG TEGADERM 4X4.75 (GAUZE/BANDAGES/DRESSINGS) ×1 IMPLANT
DRSG TELFA 3X8 NADH (GAUZE/BANDAGES/DRESSINGS) ×2 IMPLANT
GAUZE SPONGE 4X4 12PLY STRL (GAUZE/BANDAGES/DRESSINGS) ×2 IMPLANT
GLOVE BIOGEL PI ORTHO PRO 7.5 (GLOVE) ×1
GLOVE PI ORTHO PRO STRL 7.5 (GLOVE) ×1 IMPLANT
GOWN STRL REUS W/ TWL LRG LVL3 (GOWN DISPOSABLE) ×2 IMPLANT
GOWN STRL REUS W/TWL LRG LVL3 (GOWN DISPOSABLE) ×4
KIT TURNOVER KIT A (KITS) ×2 IMPLANT
NS IRRIG 1000ML POUR BTL (IV SOLUTION) ×2 IMPLANT
PACK C SECTION AR (MISCELLANEOUS) ×2 IMPLANT
PAD DRESSING TELFA 3X8 NADH (GAUZE/BANDAGES/DRESSINGS) ×1 IMPLANT
PAD OB MATERNITY 4.3X12.25 (PERSONAL CARE ITEMS) ×2 IMPLANT
PAD PREP 24X41 OB/GYN DISP (PERSONAL CARE ITEMS) ×2 IMPLANT
PENCIL SMOKE ULTRAEVAC 22 CON (MISCELLANEOUS) ×2 IMPLANT
RETRACTOR WND ALEXIS-O 25 LRG (MISCELLANEOUS) ×1 IMPLANT
RTRCTR WOUND ALEXIS O 25CM LRG (MISCELLANEOUS) ×2
SPONGE GAUZE 2X2 8PLY STRL LF (GAUZE/BANDAGES/DRESSINGS) ×1 IMPLANT
SPONGE LAP 18X18 RF (DISPOSABLE) ×2 IMPLANT
SUT VIC AB 0 CTX 36 (SUTURE) ×4
SUT VIC AB 0 CTX36XBRD ANBCTRL (SUTURE) ×2 IMPLANT
SUT VIC AB 1 CT1 36 (SUTURE) ×4 IMPLANT
SUT VICRYL+ 3-0 36IN CT-1 (SUTURE) ×4 IMPLANT

## 2020-01-25 NOTE — Op Note (Addendum)
(Stat) Cesarean Section Procedure Note  Indications: ruptured uterus  Pre-operative Diagnosis: 39 week 5 day pregnancy, history of prior C-section x 1, desires TOLAC, morbid obesity, GBS positive status, chorioamnionitis, suspected uterine rupture.   Post-operative Diagnosis: Same with confirmed uterine rupture (posterior uterine wall and left broad ligament).   Surgeon: Hildred Laser, MD.  Brennan Bailey, MD (co-surgeon)  Assistants:   Serafina Royals, CNM.  An experienced assistant was required given the standard of surgical care given the complexity of the case.  This assistant was needed for exposure, dissection, suctioning, retraction, instrument exchange, and for overall help during the procedure.   Procedure: Repeat low transverse Cesarean Section  Anesthesia: General anesthesia   Findings: Female infant, cephalic presentation, 3600 grams, with Apgar scores of 0 at one minute, 3 at five minutes, and 5 at ten minutes. Intact placenta with 3 vessel cord.  Posterior uterine wall defect with rent of the left broad ligament, ~ 4 cm in length.  "Oatmeal" colored pus in amniotic fluid noted on entry of peritoneal cavity. The fallopian tubes and ovaries appeared normal.   Procedure Details: The patient was seen in the Holding Room. The risks, benefits, complications, treatment options, and expected outcomes were discussed with the patient.  The patient concurred with the proposed plan, giving informed consent.  The site of surgery properly noted/marked. The patient was taken to the Operating Room, identified as Natalie Beck and the procedure verified as C-Section Delivery.   Please see Dr. Brennan Bailey operative note for details of initial portion of the procedure as I was not immediately present to start the emergency procedure. At the time of my arrival the infant and placenta had just been delivered. The uterus was exteriorized and cleared of all clots and debris. The hysterotomy incision  and the suspected area of uterine rupture on the posterior lower uterine segment was then identified and repaired as per Dr. Logan Bores note.  The uterine  incision was closed with running locked sutures of 0-Vicryl (beginning from angles and converging in the midline). An area of oozing was noted in the midline and on the left side of the incision. Several figure-of-eight sutures were placed at the sites.  Hemostasis was observed. Attention was then turned to the posterior aspect of the uterus, where a rent was noted in the posterior wall of the uterus and through the broad ligament. The posterior uterine wall defect was repaired with a running suture of 0-Vicryl. The broad ligament rent was repaired with a 3-0 Vicryl in a running fashion, careful to attempt to avoid major vessels and ureter.  Hemostasis was again noted. The uterus was returned to the abdomen and again view of the surgical site noted overall hemostasis. The periocolic gutters where then cleared of all clots and debris. The peritoneum was approximated with a figure-of-eight suture of 3-0 Vicryl. The fascia was injected with approximately 70 cc of Exparel solution. The fascia was then reapproximated with a running suture of 0-Vicryl.    At this time Radiologyteam presented to the OR to perform an X-ray of the field for retained sponges and instruments due to the stat nature of the procedure and no formal initial count obtained.  During the X-ray, there was noted to be contamination of the surgical field, so at conclusion of the imaging, the patient was re-prepped with betadine and re-draped.  The subcutaneous fat layer was reapproximated with 3-0 Vicryl in an interrupted fashion. The skin was reapproximated with staples. The skin was then injected  with an additional 20 cc of Exparel.   Call by radiologist Narda Rutherford, MD) at conclusion of the case who noted no retained foreign objects noted on X-ray.    Patient was extubated and taken to the  recovery room in stable condition.  She will continue on prophylactic antibiotics for the next 24 hours due to findings of chorioamnionitis with pus in the abdomen, as well as stat nature of the procedure and contamination with X-ray.    Estimated Blood Loss:  550 ml      Drains: foley catheter to gravity drainage, 675 ml of clear urine at end of the procedure         Total IV Fluids:  1200 ml  Specimens: Placenta and Disposition:  Sent to Pathology         Implants: None         Complications:  None; patient tolerated the procedure well.         Disposition: PACU - hemodynamically stable.         Condition: stable   Hildred Laser, MD [facility]

## 2020-01-25 NOTE — Progress Notes (Signed)
Patient ID: Natalie Beck, female   DOB: 1990-09-22, 29 y.o.   MRN: [id number]  Natalie Beck is a 29 y.o. G2P1001 at [redacted]w[redacted]d by ultrasound admitted for induction of labor due to morbid obesity.  Subjective:  Patient requests cervical exam to "see where things are". Not feeling any contractions or pelvic pressure.   Denies difficulty breathing or respiratory distress, chest pain, abdominal pain, dysuria, and leg pain.   Objective:  Temp:  [98.1 F (36.7 C)-98.8 F (37.1 C)] 98.1 F (36.7 C) (10/05 0720) Pulse Rate:  [70-121] 91 (10/05 0720) Resp:  [16-17] 17 (10/05 0720) BP: (86-120)/(36-72) 117/60 (10/05 0720) SpO2:  [97 %-100 %] 99 % (10/05 0720) Weight:  [116.1 kg] 116.1 kg (10/04 0947)  Fetal Wellbeing:  Category I  UC:   irregular, every two (2) to four (4) minutes; soft resting tone; pitocin infusing at 16 mu/min  SVE:   Dilation: 6 Effacement (%): 50 Exam by:: Bridgett Hattabaugh CNM   Labs: Lab Results  Component Value Date   WBC 10.0 01/24/2020   HGB 11.2 (L) 01/24/2020   HCT 33.7 (L) 01/24/2020   MCV 93.6 01/24/2020   PLT 156 01/24/2020    Assessment:  Natalie A Williamsis a 28 y.o.G2P1001 at [redacted]w[redacted]d admitted for induction of labordue to morbid obesity, history of cesarean section, Rh positive, GBS positive  FHR Category I  Plan:  Continue to titrate pitocin per order set.   Encouraged position change and use of peanut ball.   Reviewed red flag symptoms and when to call.    Serafina Royals  [facility], [facility] 01/25/2020, 8:39 AM

## 2020-01-25 NOTE — Progress Notes (Signed)
Pharmacy Antibiotic Note  Natalie Beck is a 29 y.o. female admitted on 01/24/2020. Pharmacy has been consulted for gentamicin dosing for chorioamnionitis. Patient with BMI 50.  Plan: Gentamicin 360 mg q24h (~5 mg/kg) q24h based on adjusted body weight. Will continue to follow plan. Obtain levels as clinically indicated based on planned length of therapy.  Height: 5' (152.4 cm) Weight: 116.1 kg (256 lb) IBW/kg (Calculated) : 45.5  Temp (24hrs), Avg:99 F (37.2 C), Min:98.1 F (36.7 C), Max:100.3 F (37.9 C)  Recent Labs  Lab 01/24/20 0928  WBC 10.0  CREATININE 0.59    Estimated Creatinine Clearance: 121.8 mL/min (by C-G formula based on SCr of 0.59 mg/dL).    Allergies  Allergen Reactions  . Citrus Dermatitis    Hypoallergenic dermatitis-tongue swells and itches  . Other     Pet dander    Antimicrobials this admission: Penicillin G 10/4 >>  Gentamicin 10/5 >>     Thank you for allowing pharmacy to be a part of this patient's care.  Pricilla Riffle, PharmD 01/25/2020 3:10 PM

## 2020-01-25 NOTE — Anesthesia Procedure Notes (Signed)
Epidural Patient location during procedure: OB Start time: 01/25/2020 2:19 AM End time: 01/25/2020 2:29 AM  Staffing Anesthesiologist: Karleen Hampshire, MD Performed: anesthesiologist   Preanesthetic Checklist Completed: patient identified, IV checked, site marked, risks and benefits discussed, surgical consent, monitors and equipment checked, pre-op evaluation and timeout performed  Epidural Patient position: sitting Prep: ChloraPrep Patient monitoring: heart rate, continuous pulse ox and blood pressure Approach: midline Location: L4-L5 Injection technique: LOR saline  Needle:  Needle type: Tuohy  Needle gauge: 18 G Needle length: 9 cm and 9 Needle insertion depth: 6 cm Catheter type: closed end flexible Catheter size: 20 Guage Catheter at skin depth: 11 cm Test dose: negative and Other  Assessment Events: blood not aspirated, injection not painful, no injection resistance, no paresthesia and negative IV test  Additional Notes Risks and benefits of procedure discussed with patient.  Risks including but not limited to infection, spinal/epidural hematoma, nerve injury, post dural puncture headache, and inadequate/failed block.  Patient expressed understanding and consented to epidural placement. Negative dural puncture.  Negative aspiration.  Negative paresthesia on injection.  Dose given in divided aliquots.  Patient tolerated the procedure well with no immediate complications.   Reason for block:procedure for pain

## 2020-01-25 NOTE — Progress Notes (Signed)
Pt pushing with RN and CNM at bedside. Around 1830 pt reported sharp pain on her mid left abd. Pt reports it feels like she has fluid built up. Pt rubbing abd trying to manipulate suspected fluid. Warm wash cloth applied. CNM stepped out to update MD. While CNM was out of the room pt began to become short of breath and felt like she couldn't take a deep breath. RN asked another nurse to call CNM back to room. Pt started to report lower abd pain. When RN touched right lower abd near prior csection incision pt reported sharp pain. CNM now at bedside. Pt on cont. Pulse ox. VS stable. Pt felt like she was going to pass out. Ammonia popped. CNM at bedside evaluating patients pain when fetal HR dropped. Stat csection called at at 1846. Pt transported to OR.

## 2020-01-25 NOTE — Progress Notes (Signed)
Patient ID: [name], female   DOB: [birth year], 29 y.o.   MRN: [id number]  Natalie Beck is a 29 y.o. G2P1001 at [redacted]w[redacted]d by ultrasound admitted for induction of labor due to morbid obesity.  Subjective:  Patient resting quietly in bed with eyes closed. Reports relief of pain since epidural placement.   FOB and doula at bedside for continuous labor support.   Denies difficulty breathing or respiratory distress, chest pain, abdominal pain, dysuria, and leg pain.  Objective:  Temp:  [98.3 F (36.8 C)-98.8 F (37.1 C)] 98.5 F (36.9 C) (10/04 1905) Pulse Rate:  [70-121] 70 (10/05 0440) Resp:  [16] 16 (10/04 1905) BP: (86-120)/(36-72) 108/59 (10/05 0440) SpO2:  [97 %-100 %] 99 % (10/05 0500) Weight:  [116.1 kg] 116.1 kg (10/04 0947)  Fetal Wellbeing:  Category I  UC:   regular, every four (4) to five (5) minutes; soft resting tone, pitocin infusing at 10 mu/min  SVE:   Dilation: 6 Effacement (%): 50 Exam by:: Natalie Beck CNM   Labs: Lab Results  Component Value Date   WBC 10.0 01/24/2020   HGB 11.2 (L) 01/24/2020   HCT 33.7 (L) 01/24/2020   MCV 93.6 01/24/2020   PLT 156 01/24/2020    Assessment:  Natalie A Williamsis a 29 y.o.G2P1001 at [redacted]w[redacted]d admitted for induction of labordue to morbid obesity, history of cesarean section, Rh positive, GBS positive  FHR Category I  Plan:  Encouraged rest, position changes and use of peanut ball.   Reviewed red flag symptoms and when to call.   Continue orders as written. Reassess as needed.    Natalie Beck  [facility], [facility] 01/25/2020, 5:09 AM

## 2020-01-25 NOTE — Progress Notes (Addendum)
Patient ID: Natalie Beck, female   DOB: 1991-03-02, 29 y.o.   MRN: [id number]  Late entry  1550 In room with patient, side lying release and walcher's maneuver performed. FHR Category II. CNM at bedside and on unit for continuous labor support. Update given to Dr. Valentino Saxon via telephone.    1720 Dr. Valentino Saxon notified of persistent Category II FHR tracing. SVE: 9-9.5/100/-1, vertex. Request for bedside evaluation by MD. Patient notified of CNM request for MD to evaluate strip. Patient became tearful and states, "I know what that means". MD to bedside for evaluation. Plan to push with patient in various positions now that anterior lip has been reduced, patient agreeable to plan of care. Coached maternal pushing efforts continued with CNM and RN.   1830 Patient reports sharp left side pain while pushing and moved in to resting position. Warm compress applied to site per patient request. CNM left room to update Dr.Cherry. CNM called back to room due to new patient concerns regarding shortness of breath. Vital signs checked. Portable EKG requested to bedside. Patient complains for "pain worse than contractions". Abdomen tender to touch with sudden drop in FHR to 60s.   1846 Stat cesarean section called. Dr Valentino Saxon notified. Dr. Logan Bores notified, in house.    Serafina Royals, CNM [facility], [facility] 01/25/20 11:22 PM

## 2020-01-25 NOTE — Op Note (Signed)
Attachment/Addendum to OP Note by Dr. Valentino Saxon:  (Ms. Mcgourty was " specialed" throughout her prenatal care and during her labor by Nunzio Cory and Valentino Saxon MD.)  While on L&D, I was urgently asked to begin a STAT cesarean delivery for apatient whose baby had a heart rate that was less than 60 and not recovering.  It being a STAT cesarean delivery, and Dr. Valentino Saxon not being present on labor and delivery, I was called to begin the case to expedite the delivery of the infant.  Procedure:  Patient was on the operating room table and after gowning and gloving I proceeded to rapidly cleanse the abdomen with Betadine.  The patient was draped and then placed under general anesthesia.  A transverse incision was made across the abdomen in a Pfannenstiel manner through the old scar. We carried the dissection down to the level of the fascia.  The fascia was incised in a curvilinear manner.  The fascia was then elevated from the rectus muscles with blunt and sharp dissection.  The rectus muscles were separated laterally exposing the peritoneum.  The peritoneum was carefully entered with care being taken to avoid bowel and bladder.  We immediately encountered copious fluid and what appeared to be oatmeal colored pus.  A self-retaining retractor was placed.  The bladder was found to be well out of the operative field.  No obvious defect was noted at this time in the anterior uterus/lower uterine segment.  A transverse incision was made across the lower uterine segment and extended laterally and superiorly using the bandage scissors. The infant was delivered from the cephalic position.  The cord was immediately triply clamped and divided with an intact segment left for cord blood gases.  The infant was noted lack tone at birth and was immediately handed to the pediatric personnel  who then placed the infant under heat lamps where it was resuscitated as needed. The placenta was delivered and noted to be intact.  At this time  Dr. Valentino Saxon scrubbed into the case. The hysterotomy incision was then identified on ring forceps.  The uterine cavity was carefully explored and a posterior lower uterine segment defect was noted on the patient's left side.  The assumption was made that this was the area of uterine rupture that was suspected prior to the cesarean delivery.  This rent was approximately 4 cm in length and was obviously the source of amniotic fluid and pus that was immediately noted upon  entry into the peritoneal cavity. I continue to help Dr. Valentino Saxon in the closure of the hysterotomy incision and the posterior defect as well as a small defect in the left broad ligament.  When hemostasis of the uterine incisions and posterior defect was noted and the patient was stable I scrubbed out of the case allowing Lawhorn and Cherry to continue.  Elonda Husky MD

## 2020-01-25 NOTE — Progress Notes (Signed)
Patient ID: [name], female   DOB: 09-Jun-1990, 29 y.o.   MRN: [id number]  Natalie Beck is a 29 y.o. G2P1001 at [redacted]w[redacted]d by ultrasound admitted for induction of labor due to morbid obesity.  Subjective:  Patient sitting in bed, FOB and doula at bedside for continuous labor support.   Reports intermittent pelvic pressure. Concern for fetal safety due to decelerations.   Denies difficulty breathing or respiratory distress, chest pain, abdominal pain, vaginal bleeding, dysuria, and leg pain or swelling.   Objective:  Temp:  [98.1 F (36.7 C)-98.9 F (37.2 C)] 98.9 F (37.2 C) (10/05 1320) Pulse Rate:  [70-121] 91 (10/05 0720) Resp:  [16-17] 17 (10/05 0720) BP: (86-120)/(36-72) 117/60 (10/05 0720) SpO2:  [97 %-100 %] 100 % (10/05 1200)  Fetal Wellbeing:  Category II  UC:   regular, every two (2) to five (5) minutes; soft resting tone  SVE:   Dilation: 8.5 Effacement (%): 80 Station: -2 Exam by:: Nicolas Banh  Labs: Lab Results  Component Value Date   WBC 10.0 01/24/2020   HGB 11.2 (L) 01/24/2020   HCT 33.7 (L) 01/24/2020   MCV 93.6 01/24/2020   PLT 156 01/24/2020    Assessment:  Natalie A Williamsis a 29 y.o.G2P1001 at [redacted]w[redacted]d admitted for induction of labordue to morbid obesity, history of cesarean section, Rh positive, GBS positive, Maternal fever  FHR Category II  Plan:  Reassurance offered.   Discussed possible need for cesarean section if fetal heart rate decelerations are unresolved with treatment measures. Patient verbalized understanding.   IUPC and FSE placed without difficulty.   Rx: Gentamycin, see orders.   Update given to Dr. Valentino Saxon.    Natalie Beck  [facility], [facility] 01/25/2020, 1:59 PM

## 2020-01-25 NOTE — Anesthesia Procedure Notes (Signed)
Procedure Name: Intubation Date/Time: 01/25/2020 6:58 PM Performed by: Hezzie Bump, CRNA Pre-anesthesia Checklist: Patient identified, Patient being monitored, Timeout performed, Emergency Drugs available and Suction available Patient Re-evaluated:Patient Re-evaluated prior to induction Oxygen Delivery Method: Circle system utilized Preoxygenation: Pre-oxygenation with 100% oxygen Induction Type: IV induction and Rapid sequence Laryngoscope Size: 3 and McGraph Grade View: Grade II Tube type: Oral Tube size: 7.0 mm Number of attempts: 2 Airway Equipment and Method: Stylet and Video-laryngoscopy Placement Confirmation: ETT inserted through vocal cords under direct vision,  positive ETCO2 and breath sounds checked- equal and bilateral Secured at: 21 cm Tube secured with: Tape Dental Injury: Teeth and Oropharynx as per pre-operative assessment

## 2020-01-25 NOTE — Progress Notes (Addendum)
Late Entry  Called by Serafina Royals, CNM for evaluation of the patient. Natalie Beck is a 29 y.o. G74P2002 female who was admitted for scheduled IOL for morbid obesity, prior C-section x 1 desiring TOLAC.  Persistent Category II tracing noted with patient's last cervical exam of 9 cm. Currently has amnioinfusion going, and has undergone multiple position changes.  Also noted to have suspected chorioamnionitis (febrile morbidity of 101 degrees Celsius at ~ 3:45 pm with fetal tachycardia), despite ruptured membranes for less than 12 hrs. Was receiving GBS prophylaxis with PCN, however with temperature noted, patient was given a dose of gentamycin and clindamycin.  Recurrent decelerations noted from baseline  with almost every contraction but good variability noted.  My cervical exam noted patient to be 9.5/100/-2 to -1 station. Pitocin had initially been held, however was recently reinitiated and was at 2 mIU.  With adequate pushing effort and my attempts to hold back the cervix, the patient was able to push to complete dilation and change to-1 station with ~ 3 sets of pushes.  Review of tracing noting decelerations from baseline of 150s-160's, to 90s with fairly quick return (less than 1 minute) to baseline and continued good variability.  She was allowed to continue pushing with the midwife with low threshold for possible vacuum assistance (if appropriate) or Cesarean delivery if indicated by worsening fetal tracing .     Hildred Laser, MD  [facility]

## 2020-01-25 NOTE — Anesthesia Preprocedure Evaluation (Signed)
Anesthesia Evaluation  Patient identified by MRN, date of birth, ID band Patient awake    Reviewed: Allergy & Precautions, H&P , NPO status , Patient's Chart, lab work & pertinent test results  Airway Mallampati: II  TM Distance: >3 FB     Dental  (+) Teeth Intact   Pulmonary asthma , former smoker,           Cardiovascular Exercise Tolerance: Good (-) hypertensionnegative cardio ROS       Neuro/Psych    GI/Hepatic negative GI ROS,   Endo/Other  Morbid obesity  Renal/GU   negative genitourinary   Musculoskeletal   Abdominal   Peds  Hematology  (+) anemia , Hgb 11.2   Anesthesia Other Findings Past Medical History: No date: Anemia No date: Asthma  Past Surgical History: 08/17/2017: CESAREAN SECTION; N/A     Comment:  Procedure: CESAREAN SECTION;  Surgeon: Linzie Collin, MD;  Location: [facility] ORS;  Service: Obstetrics;                Laterality: N/A; No date: dental implant No date: WISDOM TOOTH EXTRACTION  BMI    Body Mass Index: 50.00 kg/m      Reproductive/Obstetrics (+) Pregnancy                             Anesthesia Physical Anesthesia Plan  ASA: III  Anesthesia Plan: Epidural   Post-op Pain Management:    Induction:   PONV Risk Score and Plan:   Airway Management Planned:   Additional Equipment:   Intra-op Plan:   Post-operative Plan:   Informed Consent: I have reviewed the patients History and Physical, chart, labs and discussed the procedure including the risks, benefits and alternatives for the proposed anesthesia with the patient or authorized representative who has indicated his/her understanding and acceptance.       Plan Discussed with: Anesthesiologist  Anesthesia Plan Comments:         Anesthesia Quick Evaluation

## 2020-01-25 NOTE — Transfer of Care (Signed)
Immediate Anesthesia Transfer of Care Note  Patient: Drue A Cass  Procedure(s) Performed: CESAREAN SECTION  Patient Location: PACU  Anesthesia Type:General  Level of Consciousness: sedated and patient cooperative  Airway & Oxygen Therapy: Patient Spontanous Breathing and Patient connected to face mask oxygen  Post-op Assessment: Report given to RN and Post -op Vital signs reviewed and stable  Post vital signs: Reviewed and stable  Last Vitals:  Vitals Value Taken Time  BP 120/89 01/25/20 2132  Temp 36.4 C 01/25/20 2117  Pulse 141 01/25/20 2132  Resp 16 01/25/20 2132  SpO2 100 % 01/25/20 2132  Vitals shown include unvalidated device data.  Last Pain:  Vitals:   01/25/20 1537  TempSrc: Axillary  PainSc:       Patients Stated Pain Goal: 0 (01/24/20 1434)  Complications: No complications documented.

## 2020-01-25 NOTE — Progress Notes (Signed)
Patient ID: Natalie Beck, female   DOB: 10-15-1990, 29 y.o.   MRN: [id number]   Natalie Beck is a 29 y.o. G2P1001 at [redacted]w[redacted]d by ultrasound admitted for induction of labor due to morbid obesity.  Subjective:  Patient in room, leaning over bed and breathing through contractions.   FOB and doula at bedside for continuous labor support.   Denies difficulty breathing or respiratory distress, chest pain, dysuria, and leg pain.   Objective:  Temp:  [98.3 F (36.8 C)-98.8 F (37.1 C)] 98.5 F (36.9 C) (10/04 1905) Pulse Rate:  [77-89] 89 (10/04 1905) Resp:  [16] 16 (10/04 1905) BP: (113-120)/(68-72) 113/68 (10/04 1905) SpO2:  [97 %] 97 % (10/04 0947) Weight:  [116.1 kg] 116.1 kg (10/04 0947)  Fetal Wellbeing:  Category I  UC:   regular, every one (1) to two (2) minutes; soft resting tone, pitocin infusing at 6 mu/min  SVE:   Dilation: 1 Effacement (%): 50 Exam by:: Serafina Royals, CNM  Labs: Lab Results  Component Value Date   WBC 10.0 01/24/2020   HGB 11.2 (L) 01/24/2020   HCT 33.7 (L) 01/24/2020   MCV 93.6 01/24/2020   PLT 156 01/24/2020    Assessment:  Natalie A Williamsis a 28 y.o.G2P1001 at [redacted]w[redacted]d admitted for induction of labordue to morbid obesity, history of cesarean section, Rh positive, GBS positive  FHR Category I   Plan:  Offered to decreased pitocin titration dose at this time, declined by patient.   Encouraged position change and other non-pharmacologic pain management techniques.   Reviewed red flag symptoms and when to call.   Continue orders as written. Reassess as needed.    Serafina Royals  [facility], [facility] 01/25/2020, 12:38 AM

## 2020-01-26 ENCOUNTER — Encounter: Payer: Self-pay | Admitting: Certified Nurse Midwife

## 2020-01-26 ENCOUNTER — Inpatient Hospital Stay (HOSPITAL_COMMUNITY)
Admission: AD | Admit: 2020-01-26 | Payer: Medicaid Other | Source: Ambulatory Visit | Admitting: Obstetrics and Gynecology

## 2020-01-26 ENCOUNTER — Inpatient Hospital Stay (HOSPITAL_COMMUNITY)
Admission: AD | Admit: 2020-01-26 | Discharge: 2020-01-28 | DRG: 831 | Disposition: A | Discharge status: Home / Self Care | Payer: Medicaid Other | Attending: Obstetrics & Gynecology | Admitting: Obstetrics & Gynecology

## 2020-01-26 ENCOUNTER — Inpatient Hospital Stay: Admit: 2020-01-26 | Payer: Medicaid Other | Admitting: Obstetrics and Gynecology

## 2020-01-26 DIAGNOSIS — R8271 Bacteriuria: Secondary | ICD-10-CM | POA: Diagnosis present

## 2020-01-26 DIAGNOSIS — O99215 Obesity complicating the puerperium: Secondary | ICD-10-CM | POA: Diagnosis present

## 2020-01-26 DIAGNOSIS — Z3A39 39 weeks gestation of pregnancy: Secondary | ICD-10-CM | POA: Diagnosis not present

## 2020-01-26 DIAGNOSIS — Z87891 Personal history of nicotine dependence: Secondary | ICD-10-CM

## 2020-01-26 DIAGNOSIS — O1205 Gestational edema, complicating the puerperium: Secondary | ICD-10-CM | POA: Diagnosis present

## 2020-01-26 DIAGNOSIS — Z8759 Personal history of other complications of pregnancy, childbirth and the puerperium: Secondary | ICD-10-CM | POA: Diagnosis present

## 2020-01-26 DIAGNOSIS — O34211 Maternal care for low transverse scar from previous cesarean delivery: Secondary | ICD-10-CM | POA: Diagnosis not present

## 2020-01-26 DIAGNOSIS — D62 Acute posthemorrhagic anemia: Secondary | ICD-10-CM | POA: Diagnosis present

## 2020-01-26 DIAGNOSIS — O99893 Other specified diseases and conditions complicating puerperium: Secondary | ICD-10-CM | POA: Diagnosis present

## 2020-01-26 DIAGNOSIS — O411230 Chorioamnionitis, third trimester, not applicable or unspecified: Secondary | ICD-10-CM | POA: Diagnosis not present

## 2020-01-26 DIAGNOSIS — O9081 Anemia of the puerperium: Secondary | ICD-10-CM | POA: Diagnosis not present

## 2020-01-26 DIAGNOSIS — Z452 Encounter for adjustment and management of vascular access device: Secondary | ICD-10-CM | POA: Diagnosis not present

## 2020-01-26 DIAGNOSIS — Z4682 Encounter for fitting and adjustment of non-vascular catheter: Secondary | ICD-10-CM | POA: Diagnosis not present

## 2020-01-26 DIAGNOSIS — E669 Obesity, unspecified: Secondary | ICD-10-CM | POA: Diagnosis present

## 2020-01-26 DIAGNOSIS — Z98891 History of uterine scar from previous surgery: Secondary | ICD-10-CM

## 2020-01-26 DIAGNOSIS — Z0389 Encounter for observation for other suspected diseases and conditions ruled out: Secondary | ICD-10-CM | POA: Diagnosis not present

## 2020-01-26 DIAGNOSIS — K5900 Constipation, unspecified: Secondary | ICD-10-CM | POA: Diagnosis not present

## 2020-01-26 LAB — CBC
HCT: 30.7 % — ABNORMAL LOW (ref 36.0–46.0)
Hemoglobin: 10.5 g/dL — ABNORMAL LOW (ref 12.0–15.0)
MCH: 31.5 pg (ref 26.0–34.0)
MCHC: 34.2 g/dL (ref 30.0–36.0)
MCV: 92.2 fL (ref 80.0–100.0)
Platelets: 146 10*3/uL — ABNORMAL LOW (ref 150–400)
RBC: 3.33 MIL/uL — ABNORMAL LOW (ref 3.87–5.11)
RDW: 15.7 % — ABNORMAL HIGH (ref 11.5–15.5)
WBC: 18.9 10*3/uL — ABNORMAL HIGH (ref 4.0–10.5)
nRBC: 0 % (ref 0.0–0.2)

## 2020-01-26 MED ORDER — OXYTOCIN-SODIUM CHLORIDE 30-0.9 UT/500ML-% IV SOLN: 2.5000 [IU]/h | INTRAVENOUS | Status: AC

## 2020-01-26 MED ORDER — SODIUM CHLORIDE 0.9 % IV SOLN
3.0000 g | Freq: Once | INTRAVENOUS | Status: AC
  Administered 2020-01-27: 3 g via INTRAVENOUS
  Filled 2020-01-26: qty 3

## 2020-01-26 MED ORDER — IBUPROFEN 600 MG PO TABS
600.0000 mg | ORAL_TABLET | Freq: Four times a day (QID) | ORAL | Status: DC
  Administered 2020-01-27 – 2020-01-28 (×7): 600 mg via ORAL
  Filled 2020-01-26 (×7): qty 1

## 2020-01-26 MED ORDER — ZOLPIDEM TARTRATE 5 MG PO TABS: 5.0000 mg | ORAL_TABLET | Freq: Every evening | ORAL | Status: DC | PRN

## 2020-01-26 MED ORDER — TETANUS-DIPHTH-ACELL PERTUSSIS 5-2.5-18.5 LF-MCG/0.5 IM SUSP: 0.5000 mL | Freq: Once | INTRAMUSCULAR | Status: DC

## 2020-01-26 MED ORDER — LACTATED RINGERS IV SOLN: INTRAVENOUS | Status: DC

## 2020-01-26 MED ORDER — ACETAMINOPHEN 325 MG PO TABS
650.0000 mg | ORAL_TABLET | Freq: Four times a day (QID) | ORAL | Status: DC | PRN
  Administered 2020-01-27 – 2020-01-28 (×4): 650 mg via ORAL
  Filled 2020-01-26 (×4): qty 2

## 2020-01-26 MED ORDER — PRENATAL MULTIVITAMIN CH
1.0000 | ORAL_TABLET | Freq: Every day | ORAL | Status: DC
  Administered 2020-01-27 – 2020-01-28 (×2): 1 via ORAL
  Filled 2020-01-26 (×2): qty 1

## 2020-01-26 MED ORDER — SIMETHICONE 80 MG PO CHEW
80.0000 mg | CHEWABLE_TABLET | Freq: Three times a day (TID) | ORAL | Status: DC
  Administered 2020-01-27 – 2020-01-28 (×3): 80 mg via ORAL
  Filled 2020-01-26 (×3): qty 1

## 2020-01-26 MED ORDER — OXYCODONE HCL 5 MG PO TABS
5.0000 mg | ORAL_TABLET | Freq: Four times a day (QID) | ORAL | Status: DC | PRN
  Administered 2020-01-27 – 2020-01-28 (×6): 5 mg via ORAL
  Filled 2020-01-26 (×6): qty 1

## 2020-01-26 MED ORDER — DIBUCAINE (PERIANAL) 1 % EX OINT: 1.0000 "application " | TOPICAL_OINTMENT | CUTANEOUS | Status: DC | PRN

## 2020-01-26 MED ORDER — SENNOSIDES-DOCUSATE SODIUM 8.6-50 MG PO TABS
2.0000 | ORAL_TABLET | ORAL | Status: DC
  Administered 2020-01-27 (×2): 2 via ORAL
  Filled 2020-01-26 (×2): qty 2

## 2020-01-26 MED ORDER — WITCH HAZEL-GLYCERIN EX PADS: 1.0000 "application " | MEDICATED_PAD | CUTANEOUS | Status: DC | PRN

## 2020-01-26 MED ORDER — DIPHENHYDRAMINE HCL 25 MG PO CAPS: 25.0000 mg | ORAL_CAPSULE | Freq: Four times a day (QID) | ORAL | Status: DC | PRN

## 2020-01-26 MED ORDER — COCONUT OIL OIL
1.0000 "application " | TOPICAL_OIL | Status: DC | PRN
  Administered 2020-01-27: 1 via TOPICAL

## 2020-01-26 MED ORDER — MENTHOL 3 MG MT LOZG
1.0000 | LOZENGE | OROMUCOSAL | Status: DC | PRN
  Filled 2020-01-26: qty 9

## 2020-01-26 MED ORDER — BREAST MILK/FORMULA (FOR LABEL PRINTING ONLY): ORAL | Status: DC

## 2020-01-26 MED ORDER — SIMETHICONE 80 MG PO CHEW: 80.0000 mg | CHEWABLE_TABLET | ORAL | Status: DC | PRN

## 2020-01-26 MED ORDER — SIMETHICONE 80 MG PO CHEW
80.0000 mg | CHEWABLE_TABLET | ORAL | Status: DC
  Administered 2020-01-27 (×2): 80 mg via ORAL
  Filled 2020-01-26 (×2): qty 1

## 2020-01-26 NOTE — Discharge Summary (Signed)
Postpartum Discharge Summary (Transfer of Care Note)   Patient being transferred from postpartum ward at [facility] to postpartum ward at [facility] for continuity of care, and NICU services.     Patient Name: Natalie Beck DOB: June 06, 1990 MRN: [id number]  Date of admission: 01/24/2020 Delivery date:01/25/2020  Delivering provider: Harlin Heys  Date of discharge: 01/26/2020  Admitting diagnosis: Encounter for planned induction of labor [Z34.90] Intrauterine pregnancy: [redacted]w[redacted]d    Secondary diagnosis:  Active Problems:   Encounter for planned induction of labor   Fetal distress during labor in liveborn infant   Chorioamnionitis in third trimester   Rupture of uterus affecting labor  Additional problems: Morbid obesity in pregnancy (BMI 50), GBS positive status    Discharge diagnosis: Term Pregnancy Delivered and uterine rupture, Morbid obesity, chorioamnionitis                                           Post partum procedures:None Augmentation: Pitocin and IP Foley Complications: Uterine Rupture  Hospital course: Induction of Labor With Cesarean Section   29y.o. yo G2P2002 at 327w5das admitted to the hospital 01/24/2020 for induction of labor for morbid obesity.  She had a history of prior C-section x 1 and desired TOLAC. Patient had a labor course significant for Category II tracing and uterine rupture at second stage of labor. She also developed suspected chorioamnionitis within the first 12 hrs of ruptured membranes.The patient went for cesarean section due to Non-Reassuring FHR and uterine rupture. Delivery details are as follows: Membrane Rupture Time/Date: 4:28 AM ,01/25/2020   Delivery Method:C-Section, Low Transverse  Details of operation can be found in separate operative Note.  Patient had an uncomplicated postpartum course. She is ambulating, tolerating a regular diet, passing flatus, and urinating well. She will continue on postpartum  antibiotics for 24 hrs due to stat nature of C-section with contamination of surgical field and, chorioamnionitis in labor. [facility]      Newborn Data: Birth date:01/25/2020  Birth time:7:04 PM  Gender:Female  Living status:Living  Apgars:0 ,3 , 5. Weight:3600 g                                 Magnesium Sulfate received: No BMZ received: No Rhophylac:No MMR:No T-DaP:Given prenatally Flu: No Transfusion:No  Physical exam  Vitals:   01/26/20 1131 01/26/20 1330 01/26/20 1500 01/26/20 1623  BP: 93/64   (!) 101/56  Pulse: 95 91 (!) 101 100  Resp: 18   18  Temp: 98.7 F (37.1 C)   98.8 F (37.1 C)  TempSrc: Oral   Oral  SpO2: 98% 100% 100% 98%  Weight:      Height:       General: alert, cooperative and no distress  CVS exam: normal rate, regular rhythm, normal S1, S2, no murmurs, rubs, clicks or gallops. Lungs: Normal respiratory effort, chest expands symmetrically. Lungs are clear to auscultation, no crackles or wheezes.  Lochia: appropriate Uterine Fundus: firm Incision: Healing well with no significant drainage, bandage clean/dry/intact DVT Evaluation: No evidence of DVT seen on physical exam. Negative Homan's sign. No cords or calf tenderness.   Labs: Lab Results  Component Value Date   WBC  18.9 (H) 01/26/2020   HGB 10.5 (L) 01/26/2020   HCT 30.7 (L) 01/26/2020   MCV 92.2 01/26/2020   PLT 146 (L) 01/26/2020   CMP Latest Ref Rng & Units 01/24/2020  Glucose 70 - 99 mg/dL 82  BUN 6 - 20 mg/dL 7  Creatinine 0.44 - 1.00 mg/dL 0.59  Sodium 135 - 145 mmol/L 135  Potassium 3.5 - 5.1 mmol/L 3.9  Chloride 98 - 111 mmol/L 105  CO2 22 - 32 mmol/L 19(L)  Calcium 8.9 - 10.3 mg/dL 8.9  Total Protein 6.5 - 8.1 g/dL 6.5  Total Bilirubin 0.3 - 1.2 mg/dL 0.4  Alkaline Phos 38 - 126 U/L 151(H)  AST 15 - 41 U/L 20  ALT 0 - 44 U/L 14   Edinburgh Score: Edinburgh  Postnatal Depression Scale Screening Tool 01/26/2020  I have been able to laugh and see the funny side of things. 0  I have looked forward with enjoyment to things. 0  I have blamed myself unnecessarily when things went wrong. 1  I have been anxious or worried for no good reason. 0  I have felt scared or panicky for no good reason. 1  Things have been getting on top of me. 1  I have been so unhappy that I have had difficulty sleeping. 0  I have felt sad or miserable. 0  I have been so unhappy that I have been crying. 1  The thought of harming myself has occurred to me. 0  Edinburgh Postnatal Depression Scale Total 4      After visit meds:  Allergies as of 01/26/2020      Reactions   Citrus Dermatitis   Hypoallergenic dermatitis-tongue swells and itches   Other    Pet dander     No current facility-administered medications on file prior to encounter.   Current Outpatient Medications on File Prior to Encounter  Medication Sig Dispense Refill   aspirin EC 81 MG tablet Take 1 tablet (81 mg total) by mouth daily. Take after 12 weeks for prevention of preeclampssia later in pregnancy 300 tablet 2   loratadine-pseudoephedrine (CLARITIN-D 24 HOUR) 10-240 MG 24 hr tablet Take by mouth.     OVER THE COUNTER MEDICATION Take 1 tablet by mouth 3 (three) times daily. Sunflower Lithosyine     Prenatal Vit-Fe Fumarate-FA (MULTIVITAMIN-PRENATAL) 27-0.8 MG TABS tablet Take 1 tablet by mouth daily at 12 noon.     Vitamin D, Ergocalciferol, (DRISDOL) 1.25 MG (50000 UNIT) CAPS capsule Take 1 capsule (50,000 Units total) by mouth every 7 (seven) days. 14 capsule 2   albuterol (PROVENTIL HFA;VENTOLIN HFA) 108 (90 Base) MCG/ACT inhaler Inhale into the lungs every 6 (six) hours as needed for wheezing or shortness of breath. (Patient not taking: Reported on 01/24/2020)       Scheduled Meds:  ferrous sulfate  325 mg Oral BID WC   gabapentin  300 mg Oral BID   ketorolac  30 mg Intravenous Q6H    Followed by   Derrill Memo ON 01/27/2020] ibuprofen  800 mg Oral Q6H   prenatal multivitamin  1 tablet Oral Q1200   scopolamine  1 patch Transdermal Once   senna-docusate  2 tablet Oral Q24H   simethicone  80 mg Oral Q24H   Continuous Infusions:  clindamycin (CLEOCIN) IV 900 mg (01/26/20 1319)   gentamicin 360 mg (01/26/20 1538)   lactated ringers     naLOXone (NARCAN) adult infusion for PRURITIS     PRN Meds:.coconut oil, witch hazel-glycerin **AND** dibucaine,  diphenhydrAMINE, HYDROmorphone (DILAUDID) injection, magnesium hydroxide, menthol-cetylpyridinium, nalbuphine **OR** nalbuphine, nalbuphine **OR** nalbuphine, naloxone **AND** sodium chloride flush, naLOXone (NARCAN) adult infusion for PRURITIS, ondansetron (ZOFRAN) IV, oxyCODONE, oxyCODONE-acetaminophen, simethicone, traMADol, zolpidem    Discharge to [place] Disposition:NICU at [facility] Discharge instruction: per After Visit Summary and Postpartum booklet. Activity: Advance as tolerated. Pelvic rest for 6 weeks.  Diet: routine diet Anticipated Birth Control: To discuss at postpartum visit Postpartum Appointment:1 week incision check Additional Postpartum F/U: 6 week postpartum visit Future Appointments: Future Appointments  Date Time Provider [place]  02/24/2020 10:15 AM Lawhorn, Lara Mulch, CNM [facility] None   Follow up Visit:  Follow-up Information    Diona Fanti, CNM. Schedule an appointment as soon as possible for a visit in 1 week(s).   Specialties: Certified Nurse Midwife, Obstetrics and Gynecology, Radiology Why: incision check Contact information: [address] [place] [address] [phone]                   01/26/2020 Rubie Maid, MD

## 2020-01-26 NOTE — Anesthesia Postprocedure Evaluation (Signed)
Anesthesia Post Note  Patient: Natalie Beck  Procedure(s) Performed: CESAREAN SECTION  Patient location during evaluation: Mother Baby Anesthesia Type: Epidural Level of consciousness: awake and alert and oriented Pain management: pain level controlled Vital Signs Assessment: post-procedure vital signs reviewed and stable Respiratory status: spontaneous breathing, nonlabored ventilation and respiratory function stable Cardiovascular status: stable Postop Assessment: no headache, no backache, no apparent nausea or vomiting, patient able to bend at knees, adequate PO intake and able to ambulate Anesthetic complications: no   No complications documented.   Last Vitals:  Vitals:   01/26/20 0757 01/26/20 0900  BP: 109/60   Pulse: 87 88  Resp: 18   Temp: 37.2 C   SpO2: 95% 99%    Last Pain:  Vitals:   01/26/20 1026  TempSrc:   PainSc: 3                  Zachary George

## 2020-01-26 NOTE — H&P (Addendum)
OBSTETRIC ADMISSION HISTORY AND PHYSICAL  Natalie Beck is a 29 y.o. female [phone] presenting for post partum care s/p stat rLTCS on 10/5 at 19:00 due to uterine rupture + NRFHT, developed triple I as well. Patient is being transferred from [facility] because her infant is being admitted to [facility] NICU.  Patient received 2 doses clindamycin and 1 dose gent prior to delivery and post op for maternal fever. POD#1 hgb stable.  Patient reports feeling fatigued and sore. Reports swelling of thighs bilaterally since delivery. Denies CP, SOB, HA, changes in vision. She denies any problems with ambulating, voiding or po intake. Reports mild constipation. Denies nausea or vomiting. Pain is moderately controlled. Patient reports appropriate lochia.    Past Medical History: Past Medical History:  Diagnosis Date  . Anemia   . Asthma     Past Surgical History: Past Surgical History:  Procedure Laterality Date  . CESAREAN SECTION N/A 08/17/2017   Procedure: CESAREAN SECTION;  Surgeon: Linzie Collin, MD;  Location: [facility] ORS;  Service: Obstetrics;  Laterality: N/A;  . CESAREAN SECTION  01/25/2020   Procedure: CESAREAN SECTION;  Surgeon: Hildred Laser, MD;  Location: [facility] ORS;  Service: Obstetrics;;  . dental implant    . WISDOM TOOTH EXTRACTION      Obstetrical History: OB History    Gravida  2   Para  2   Term  2   Preterm      AB      Living  2     SAB      TAB      Ectopic      Multiple  0   Live Births  2           Social History Social History   Socioeconomic History  . Marital status: Significant Other    Spouse name: Sheria Lang Soil scientist)  . Number of children: 1  . Years of education: Not on file  . Highest education level: Not on file  Occupational History  . Not on file  Tobacco Use  . Smoking status: Former Smoker    Packs/day: 0.50    Types: Cigarettes    Quit date: 01/23/2017    Years since quitting: 3.0  . Smokeless tobacco: Never Used  . Tobacco  comment: Already quit  Vaping Use  . Vaping Use: Former  Substance and Sexual Activity  . Alcohol use: Not Currently    Alcohol/week: 2.0 standard drinks    Types: 2 Standard drinks or equivalent per week  . Drug use: No  . Sexual activity: Yes    Partners: Male    Birth control/protection: None  Other Topics Concern  . Not on file  Social History Narrative  . Not on file   Social Determinants of Health   Financial Resource Strain:   . Difficulty of Paying Living Expenses: Not on file  Food Insecurity:   . Worried About Programme researcher, broadcasting/film/video in the Last Year: Not on file  . Ran Out of Food in the Last Year: Not on file  Transportation Needs:   . Lack of Transportation (Medical): Not on file  . Lack of Transportation (Non-Medical): Not on file  Physical Activity:   . Days of Exercise per Week: Not on file  . Minutes of Exercise per Session: Not on file  Stress:   . Feeling of Stress : Not on file  Social Connections:   . Frequency of Communication with Friends and Family: Not on file  .  Frequency of Social Gatherings with Friends and Family: Not on file  . Attends Religious Services: Not on file  . Active Member of Clubs or Organizations: Not on file  . Attends Banker Meetings: Not on file  . Marital Status: Not on file    Family History: Family History  Problem Relation Age of Onset  . Asthma Father   . Asthma Sister   . Hypertension Sister   . Rheum arthritis Paternal Grandmother   . Cancer Paternal Grandmother        lung  . Cirrhosis Maternal Grandmother   . Cancer Other        maternal great grandmother-leukemia  . Cancer Paternal Grandfather   . Breast cancer Neg Hx   . Ovarian cancer Neg Hx     Allergies: Allergies  Allergen Reactions  . Citrus Dermatitis    Hypoallergenic dermatitis-tongue swells and itches  . Other     Pet dander    Facility-Administered Medications Prior to Admission  Medication Dose Route Frequency Provider Last  Rate Last Admin  . Tdap (BOOSTRIX) injection 0.5 mL  0.5 mL Intramuscular Once Gunnar Bulla, CNM       Medications Prior to Admission  Medication Sig Dispense Refill Last Dose  . albuterol (PROVENTIL HFA;VENTOLIN HFA) 108 (90 Base) MCG/ACT inhaler Inhale into the lungs every 6 (six) hours as needed for wheezing or shortness of breath. (Patient not taking: Reported on 01/24/2020)     . aspirin EC 81 MG tablet Take 1 tablet (81 mg total) by mouth daily. Take after 12 weeks for prevention of preeclampssia later in pregnancy 300 tablet 2   . loratadine-pseudoephedrine (CLARITIN-D 24 HOUR) 10-240 MG 24 hr tablet Take by mouth.     Marland Kitchen OVER THE COUNTER MEDICATION Take 1 tablet by mouth 3 (three) times daily. Sunflower Lithosyine     . Prenatal Vit-Fe Fumarate-FA (MULTIVITAMIN-PRENATAL) 27-0.8 MG TABS tablet Take 1 tablet by mouth daily at 12 noon.     . Vitamin D, Ergocalciferol, (DRISDOL) 1.25 MG (50000 UNIT) CAPS capsule Take 1 capsule (50,000 Units total) by mouth every 7 (seven) days. 14 capsule 2      Review of Systems   All systems reviewed and negative except as stated in HPI  Blood pressure 125/67, pulse 97, temperature 98.7 F (37.1 C), temperature source Oral, resp. rate 18, last menstrual period 04/15/2019, SpO2 99 %, unknown if currently breastfeeding. General appearance: alert, cooperative and no distress Lungs: breathing comfortably Abdomen: dressed incision with serosangious drainage, no frank bleeding, appropriately tender, no rebound rigidity Cardiac: regular rate and rhythm Extremities: nonpitting edema in bilateral legs Psych: normal mood and affect  Results for orders placed or performed during the hospital encounter of 01/24/20 (from the past 24 hour(s))  CBC   Collection Time: 01/26/20  5:04 AM  Result Value Ref Range   WBC 18.9 (H) 4.0 - 10.5 K/uL   RBC 3.33 (L) 3.87 - 5.11 MIL/uL   Hemoglobin 10.5 (L) 12.0 - 15.0 g/dL   HCT 50.0 (L) 36 - 46 %   MCV 92.2  80.0 - 100.0 fL   MCH 31.5 26.0 - 34.0 pg   MCHC 34.2 30.0 - 36.0 g/dL   RDW 93.8 (H) 18.2 - 99.3 %   Platelets 146 (L) 150 - 400 K/uL   nRBC 0.0 0.0 - 0.2 %    Patient Active Problem List   Diagnosis Date Noted  . Status post cesarean section 01/26/2020  . Fetal distress  during labor in liveborn infant   . Chorioamnionitis in third trimester   . Rupture of uterus affecting labor   . Encounter for planned induction of labor 01/24/2020  . GBS bacteriuria 12/23/2019  . Patient desires vaginal birth after cesarean section (VBAC) 08/23/2019  . Pelvic pressure in pregnancy 08/23/2019  . Vitamin D deficiency 07/27/2019  . Elevated prolactin level 11/05/2018  . History of low transverse cesarean section 09/25/2017  . OBESITY, NOS 06/19/2006  . RHINITIS, ALLERGIC 06/19/2006  . ASTHMA, EXERCISE INDUCED 06/19/2006    Assessment/Plan:  Arlet A Ambroise is a 29 y.o. [id number] here for post partum, post op care.  #POD1 stat rLTCS 2/2 NRFHT and suspected uterine rupture:  -doing well, routine care  -meeting pp milestones  -pumping breastmilk  -contraception: outpatient #Pain: ibuprofen + tylenol PRN + oxy PRN #triple I: afebrile, s/p gent/clinda. VSS, 2 g Unasyn #constipation: pericolace PRN #MOF: breast #MOC: wants to decide at [facility] visit #Circ: no #DVT prophylaxis: SVDs  Herby Abraham MD, PGY-1  GME ATTESTATION:  I saw and evaluated the patient. I agree with the findings and the plan of care as documented in the resident's note.  Alric Seton, MD OB Fellow, Faculty [facility], [facility] 01/27/2020 5:34 AM

## 2020-01-26 NOTE — Discharge Summary (Signed)
Pt transferred to [facility] hospital via [facility] at 1920 on 01/26/2020. Report called to RN by day shift, Fernande Boyden, RN. VSS. Off unit at 1920 via stretcher.

## 2020-01-26 NOTE — Progress Notes (Signed)
After RN Montel Clock torain) talked with michelle lawhorn, CNM and then Dr. Valentino Saxon, nurse was instructed to change pt's c-section dressing and do a pressure dressing with gauze, telfa, ABD pad and elastoplast tape; during dressing change 2 other RNs were present to assist; one spot on right hand side of incision was actively bleeding/draining when dressing removed; one RN Arna Medici lobaugh) held pressure on that spot and after 5 min, that area was still bleeding but not as heavily; RN Development worker, community) was also assisting and contacted L&D to see if an [facility] provider was in the hospital; Dr. Logan Bores in another case in OR and message was given to him about the incision and after he finishes, can he check on pt's incision; Dr. Logan Bores said "ok"

## 2020-01-26 NOTE — Progress Notes (Signed)
°   01/26/20 1130  Clinical Encounter Type  Visited With Patient and family together  Visit Type Follow-up;Spiritual support  Referral From Nurse  Consult/Referral To Chaplain  While rounding unit, it was suggested that chaplain stop in to see Pt. Chaplain Chevelle Coulson and Chaplain Genesis briefly visited with Pt and FOB. When chaplains entered the room, Pt was in the bathroom, therefore they waited. When Pt came out of bathroom, Chaplain Keeli Roberg asked if she could pray with them and they said yes. Chaplain Andriana Casa prayed. After praying she notice Pt was emotional, therefore she asked if she could hug her. After hugging her she hug FOB because he was also emotional. Chaplain Genesis is familiar with [place] and was able to tell Pt what to expect once transferred there. Both chaplains wished them well and left.

## 2020-01-26 NOTE — Progress Notes (Signed)
Postpartum Day # 1: Repeat Cesarean Delivery (stat) for uterine rupture during TOLAC.   Subjective: Patient reports tolerating PO.  Has not voided yet as foley catheter recently removed. Notes abdominal soreness, but denies any major pain. Has passed flatus. Denies BM.   Was notified by nurse regarding incision drainage on right side of incision, dressing changed and new pressure dressing applied.    Objective: Vital signs in last 24 hours: Temp:  [97.6 F (36.4 C)-101 F (38.3 C)] 98.5 F (36.9 C) (10/06 0345) Pulse Rate:  [67-140] 90 (10/06 0700) Resp:  [16-20] 20 (10/06 0344) BP: (92-133)/(44-94) 133/76 (10/06 0344) SpO2:  [95 %-100 %] 97 % (10/06 0700)  I/O last 3 completed shifts: In: 1200 [I.V.:1200] Out: 2375 [Urine:1825; Blood:550] No intake/output data recorded.    Physical Exam:  General: alert and no distress Lungs: clear to auscultation bilaterally Breasts: normal appearance, no masses or tenderness Heart: regular rate and rhythm, S1, S2 normal, no murmur, click, rub or gallop Abdomen: soft, non-tender; bowel sounds normal; no masses,  no organomegaly Pelvis: Lochia appropriate, Uterine Fundus firm, Incision: new dressing clean/dry/intact.  Extremities: DVT Evaluation: No evidence of DVT seen on physical exam. Negative Homan's sign. No cords or calf tenderness. No significant calf/ankle edema.  Recent Labs    01/24/20 0928 01/26/20 0504  HGB 11.2* 10.5*  HCT 33.7* 30.7*    Assessment/Plan: Status post Cesarean section. Doing well postoperatively.  Breastfeeding/pumping.  Advance diet as tolerated Contraception to be discussed further postpartum.  Continue PO pain management Discontinue IVF once tolerating diet.  Mild anemia of pregnancy, not symptomatic. Can treat with iron in PNV.  Plan to transfer patient to [facility]) later today to be closer to her baby who was transferred overnight if she remains relatively stable.   Hildred Laser,  MD [facility]    \

## 2020-01-26 NOTE — Progress Notes (Signed)
CH paged for CODE CAESARIAN in LDR; when CH arrived, pt. already in OR; pt.'s fiance Sheria Lang and volunteer doula in pt.'s rm. and CH visited briefly w/them.  Current pregnancy is pt.'s second; she has 29yo dtr. at home.  Fiance shared feeling worried; shared that pt.'s delivery of first dtr. had also been C-Section and he asked about going to OR now; [facility] checked w/staff and learned this is not an option.  CH prayed w/fiance for divine protection for pt. and son being born.  CH monitored situation from outside OR; followed baby and team to special care nursery briefly; returned to provide support as RN updated fiance.  No needs expressed at this time, but CH remains available as needed.

## 2020-01-26 NOTE — Progress Notes (Signed)
Pt arrived on MBU via carelink at 2010. I assessed patient, went over safety and the room with her, then she set herself with the DEBP that was in the room. She plans to go to NICU asap to see her baby. Called faculty practice to get patient orders in. Pt comfortable and did not having any questions at the time.

## 2020-01-27 ENCOUNTER — Encounter: Payer: Medicaid Other | Admitting: Certified Nurse Midwife

## 2020-01-27 ENCOUNTER — Other Ambulatory Visit: Payer: Medicaid Other

## 2020-01-27 LAB — CBC
HCT: 26.4 % — ABNORMAL LOW (ref 36.0–46.0)
Hemoglobin: 8.4 g/dL — ABNORMAL LOW (ref 12.0–15.0)
MCH: 30.8 pg (ref 26.0–34.0)
MCHC: 31.8 g/dL (ref 30.0–36.0)
MCV: 96.7 fL (ref 80.0–100.0)
Platelets: 139 10*3/uL — ABNORMAL LOW (ref 150–400)
RBC: 2.73 MIL/uL — ABNORMAL LOW (ref 3.87–5.11)
RDW: 16 % — ABNORMAL HIGH (ref 11.5–15.5)
WBC: 12.6 10*3/uL — ABNORMAL HIGH (ref 4.0–10.5)
nRBC: 0 % (ref 0.0–0.2)

## 2020-01-27 MED ORDER — SODIUM CHLORIDE 0.9 % IV SOLN
510.0000 mg | Freq: Once | INTRAVENOUS | Status: AC
  Administered 2020-01-27: 510 mg via INTRAVENOUS
  Filled 2020-01-27: qty 17

## 2020-01-27 NOTE — Progress Notes (Addendum)
POSTPARTUM PROGRESS NOTE  POD# 2  Subjective:  Natalie Beck is a 29 y.o. [id number] s/p stat rLTCS at [redacted]w[redacted]d.  She reports she is doing well. No acute events overnight. She denies any problems with ambulating, voiding or po intake. Denies nausea or vomiting.  Pain is poorly controlled, she has not been asking for pain medication.  Lochia is minimal.  Objective: Blood pressure 120/68, pulse 78, temperature 98.4 F (36.9 C), temperature source Oral, resp. rate 16, last menstrual period 04/15/2019, SpO2 99 %, unknown if currently breastfeeding.  Physical Exam:  General: alert, cooperative and no distress Chest: no respiratory distress Uterine Fundus: firm, appropriately tender Incision: dressing c/d/i Extremities: nonpitting edema of bilateral thighs Skin: warm, dry  Recent Labs    01/24/20 0928 01/26/20 0504  HGB 11.2* 10.5*  HCT 33.7* 30.7*    Assessment/Plan: Natalie Beck is a 30 y.o. [id number] s/p stat rLTCS at [redacted]w[redacted]d   POD#1  -Doing well. Continue routine postpartum care.   -Contraception: counseled on options, declines at this time  -Feeding: breast  #Triple I  -afebrile overnight  -received amp/gent/clinda unasyn  #Acute blood loss anemia  -hgb 11.2>10.5>8.4  -feraheme x1      Dispo: Plan for discharge tomorrow or 10/9.   LOS: 1 day   Herby Abraham, PGY-1 OBGYN Faculty Teaching Service  01/27/2020, 6:27 AM   GME ATTESTATION:  I saw and evaluated the patient. I agree with the findings and the plan of care as documented in the resident's note.  Alric Seton, MD OB Fellow, Faculty [facility] 01/27/2020 7:06 AM

## 2020-01-27 NOTE — Lactation Note (Signed)
This note was copied from a baby's chart. Lactation Consultation Note  Patient Name: Natalie Beck FVCBS'W Date: 01/27/2020 Reason for consult: Follow-up assessment;NICU baby;Term  [name] is 36 hours old  ( Baby 29 73/43 week old baby) .  LC visited mom in her room 318 - and she was resting in bed.  Early LC stopped and mom was in the shower and the next time in NICU.  Per mom ready to pump.  Mom had mentioned with her last baby she used a #24 F.  LC washed her pump pieces for her and rechecked flanged and the #24 was a good fit for today and she hand expressed prior to pumping and got drops and mom seemed so encouraged.  LC reviewed pumping goals for 24 hours 8-10 both breast for 15 -20 mins.  LC also recommended hand expressing before pumping and after to enhance let down.  Per mom has [place] [facility] - and LC will fax a DEBP referral ,also mom aware to call WIC.      Maternal Data Has patient been taught Hand Expression?: Yes  Feeding    LATCH Score                   Interventions Interventions: Breast feeding basics reviewed;DEBP  Lactation Tools Discussed/Used Tools: Pump;Flanges Flange Size: 24 Breast pump type: Double-Electric Breast Pump WIC Program: Yes (LC faxed a [facility] to [facility]) Pump Review: Milk Storage;Setup, frequency, and cleaning Initiated by:: MAI / reviewed 10/7   Consult Status Consult Status: Follow-up Date: 01/28/20 Follow-up type: In-patient    Matilde Sprang Basil Blakesley 01/27/2020, 2:58 PM

## 2020-01-27 NOTE — Lactation Note (Signed)
This note was copied from a baby's chart. Lactation Consultation Note  Patient Name: Natalie Beck VVOHY'W Date: 01/27/2020 Reason for consult: Initial assessment;NICU baby;Term P2, 30 hour term female infant in NICU -1%. Mom is experience at BF, she BF her 29 year old daughter for 21 months. Mom is using the DEBP every 3 hours for 15 minutes on initial setting. Mom will follow NICU infant feeding policy and procedures. Mom knows to call [facility] services if she has any questions or concerns.  Mom made aware of O/P services, breastfeeding support groups, community resources, and our phone # for post-discharge questions.  Maternal Data Formula Feeding for Exclusion: No Has patient been taught Hand Expression?: Yes Does the patient have breastfeeding experience prior to this delivery?: Yes  Feeding    LATCH Score                   Interventions Interventions: Breast feeding basics reviewed;Skin to skin;Hand express;DEBP  Lactation Tools Discussed/Used WIC Program: Yes Pump Review: Setup, frequency, and cleaning;Milk Storage Initiated by:: RN Date initiated:: 01/26/20   Consult Status Consult Status: Follow-up Date: 01/27/20 Follow-up type: In-patient    Danelle Earthly 01/27/2020, 1:21 AM

## 2020-01-28 ENCOUNTER — Other Ambulatory Visit (HOSPITAL_COMMUNITY): Payer: Self-pay | Admitting: Student

## 2020-01-28 MED ORDER — IBUPROFEN 600 MG PO TABS: 600.0000 mg | ORAL_TABLET | Freq: Four times a day (QID) | ORAL | 0 refills | Status: DC | PRN

## 2020-01-28 MED ORDER — COCONUT OIL OIL: 1.0000 "application " | TOPICAL_OIL | 0 refills | Status: DC | PRN

## 2020-01-28 MED ORDER — OXYCODONE HCL 5 MG PO TABS: 5.0000 mg | ORAL_TABLET | Freq: Three times a day (TID) | ORAL | 0 refills | Status: DC | PRN

## 2020-01-28 MED ORDER — OXYCODONE HCL 5 MG PO TABS
10.0000 mg | ORAL_TABLET | ORAL | Status: DC | PRN
  Administered 2020-01-28: 10 mg via ORAL
  Filled 2020-01-28: qty 2

## 2020-01-28 MED ORDER — OXYCODONE HCL 5 MG PO TABS: 5.0000 mg | ORAL_TABLET | Freq: Four times a day (QID) | ORAL | 0 refills | Status: AC | PRN

## 2020-01-28 MED FILL — oxyCODONE HCL 5 MG TABS: 5 | 5 days supply | Qty: 20 | Fill #0

## 2020-01-28 MED FILL — IBUPROFEN 600 MG TABLET: 600 | 8 days supply | Qty: 30 | Fill #0

## 2020-01-28 NOTE — Progress Notes (Deleted)
POSTPARTUM PROGRESS NOTE  Post Partum Day 3  Subjective:  Natalie Beck is a 29 y.o. G2P2002 s/p pLTCS at [redacted]w[redacted]d.  She reports she is doing well. No acute events overnight. She denies any problems with ambulating, voiding or po intake. Denies nausea or vomiting.  Pain is moderately controlled.  Lochia is decreasing.  Objective: Blood pressure 129/71, pulse 81, temperature 98.7 F (37.1 C), temperature source Oral, resp. rate 18, last menstrual period 04/15/2019, SpO2 98 %, unknown if currently breastfeeding.  Physical Exam:  General: alert, cooperative and no distress Chest: no respiratory distress Heart: regular rate Abdomen: soft Uterine Fundus: firm, appropriately tender DVT Evaluation: No calf swelling or tenderness Extremities: Mild nonpitting edema of bilateral thighs Skin: warm, dry  Recent Labs    01/26/20 0504 01/27/20 0521  HGB 10.5* 8.4*  HCT 30.7* 26.4*    Assessment/Plan: Kenisha A Timoney is a 29 y.o. [id number] s/p stat pLTCS at [redacted]w[redacted]d   PPD#3 - Doing well. Continue routine postpartum care.  Contraception: wants to decide outpt  Triple I: VSS Feeding: breast Dispo: Plan for discharge tomorrow.   LOS: 2 days   Herby Abraham MD, PGY-1 OBGYN Faculty Teaching Service  01/28/2020, 7:00 AM

## 2020-01-28 NOTE — Discharge Summary (Signed)
Postpartum Discharge Summary      Patient Name: Natalie Beck DOB: 05/10/1990 MRN: [id number]  Date of admission: 01/26/2020 Delivery date:01/25/2020  Delivering provider: Harlin Heys  Date of discharge: 01/28/2020  Admitting diagnosis: Encounter for planned induction of labor [Z34.90] Intrauterine pregnancy: [redacted]w[redacted]d    Secondary diagnosis:  Active Problems:   Encounter for planned induction of labor   Fetal distress during labor in liveborn infant   Chorioamnionitis in third trimester   Rupture of uterus affecting labor  Additional problems: Morbid obesity in pregnancy (BMI 50), GBS positive status                                     Discharge diagnosis: Term Pregnancy Delivered and uterine rupture, Morbid obesity, chorioamnionitis                                           Post partum procedures:None Augmentation: Pitocin and IP Foley Complications: Uterine Rupture  Hospital course: Induction of Labor With Cesarean Section   29y.o. yo G2P2002 at 357w5das admitted to the hospital 01/24/2020 for induction of labor for morbid obesity.  She had a history of prior C-section x 1 and desired TOLAC. Patient had a labor course significant for Category II tracing and uterine rupture at second stage of labor. She also developed suspected chorioamnionitis within the first 12 hrs of ruptured membranes.The patient went for cesarean section due to Non-Reassuring FHR and uterine rupture. Delivery details are as follows: Membrane Rupture Time/Date: 4:28 AM ,01/25/2020   Delivery Method:C-Section, Low Transverse  Details of operation can be found in separate operative Note.  Patient had an uncomplicated postpartum course. She is ambulating, tolerating a regular diet, passing flatus, and urinating well. She was continued on postpartum antibiotics for 24 hrs due to stat nature of C-section with contamination of surgical field and, chorioamnionitis in labor. Patient is discharged to home  (will room in while baby is in NICU) in stable condition on 01/28/20.      Newborn Data: Birth date:01/25/2020  Birth time:7:04 PM  Gender:Female  Living status:Living  Apgars:0 ,3 , 5. Weight:3600 g                                 Magnesium Sulfate received: No BMZ received: No Rhophylac:No MMR:No T-DaP:Given prenatally Flu: No Transfusion:No  Physical exam  Vitals:   01/26/20 2015 01/27/20 0618 01/27/20 2328 01/28/20 0535  BP: 125/67 120/68 (!) 144/86 129/71  Pulse: 97 78 70 81  Resp: _0 Temp: 98.7 F (37.1 C) 98.4 F (36.9 C) 97.7 F (36.5 C) 98.7 F (37.1 C)  TempSrc: Oral Oral Oral Oral  SpO2: 99% 99% 100% 98%   General: alert, cooperative and no distress Lochia: appropriate Uterine Fundus: firm Incision: Healing well with no significant drainage, No significant erythema, Dressing is clean, dry, and intact DVT Evaluation: No evidence of DVT seen on physical exam. Negative Homan's sign. No cords or calf tenderness. Labs: Lab Results  Component Value Date   WBC 12.6 (H) 01/27/2020   HGB 8.4 (L) 01/27/2020   HCT 26.4 (L) 01/27/2020   MCV 96.7 01/27/2020   PLT 139 (L) 01/27/2020   CMP Latest  Ref Rng & Units 01/24/2020  Glucose 70 - 99 mg/dL 82  BUN 6 - 20 mg/dL 7  Creatinine 0.44 - 1.00 mg/dL 0.59  Sodium 135 - 145 mmol/L 135  Potassium 3.5 - 5.1 mmol/L 3.9  Chloride 98 - 111 mmol/L 105  CO2 22 - 32 mmol/L 19(L)  Calcium 8.9 - 10.3 mg/dL 8.9  Total Protein 6.5 - 8.1 g/dL 6.5  Total Bilirubin 0.3 - 1.2 mg/dL 0.4  Alkaline Phos 38 - 126 U/L 151(H)  AST 15 - 41 U/L 20  ALT 0 - 44 U/L 14   Edinburgh Score: Edinburgh Postnatal Depression Scale Screening Tool 01/26/2020  I have been able to laugh and see the funny side of things. 0  I have looked forward with enjoyment to things. 0  I have blamed myself unnecessarily when things went wrong. 1  I have been anxious or worried for no good reason. 0  I have felt scared or panicky for no good  reason. 1  Things have been getting on top of me. 1  I have been so unhappy that I have had difficulty sleeping. 0  I have felt sad or miserable. 0  I have been so unhappy that I have been crying. 1  The thought of harming myself has occurred to me. 0  Edinburgh Postnatal Depression Scale Total 4     After visit meds:  Allergies as of 01/28/2020      Reactions   Citrus Dermatitis   Hypoallergenic dermatitis-tongue swells and itches   Other    Pet dander      Medication List    STOP taking these medications   aspirin EC 81 MG tablet   OVER THE COUNTER MEDICATION     TAKE these medications   albuterol 108 (90 Base) MCG/ACT inhaler Commonly known as: VENTOLIN HFA Inhale into the lungs every 6 (six) hours as needed for wheezing or shortness of breath.   Claritin-D 24 Hour 10-240 MG 24 hr tablet Generic drug: loratadine-pseudoephedrine Take by mouth.   coconut oil Oil Apply 1 application topically as needed.   ibuprofen 600 MG tablet Commonly known as: ADVIL Take 1 tablet (600 mg total) by mouth every 6 (six) hours as needed for moderate pain or cramping.   multivitamin-prenatal 27-0.8 MG Tabs tablet Take 1 tablet by mouth daily at 12 noon.   Vitamin D (Ergocalciferol) 1.25 MG (50000 UNIT) Caps capsule Commonly known as: DRISDOL Take 1 capsule (50,000 Units total) by mouth every 7 (seven) days.            Discharge Care Instructions  (From admission, onward)         Start     Ordered   01/28/20 0000  Leave dressing on - Keep it clean, dry, and intact until clinic visit        01/28/20 0727           Discharge home in stable condition Infant Feeding: Breast Infant Disposition:NICU Discharge instruction: per After Visit Summary and Postpartum booklet. Activity: Advance as tolerated. Pelvic rest for 6 weeks.  Diet: routine diet Future Appointments: Future Appointments  Date Time Provider [place]  02/24/2020 10:15 AM Verdene Rio, Lara Mulch, CNM [facility] None   Follow up Visit:  Follow-up Information    [facility] [place] Follow up on 02/24/2020.   Why: for postpartum appointment Contact information: [place]  [place] [phone]  01/28/2020 Christin Fudge, CNM

## 2020-01-30 ENCOUNTER — Inpatient Hospital Stay (HOSPITAL_COMMUNITY)
Admission: AD | Admit: 2020-01-30 | Discharge: 2020-01-30 | Disposition: A | Discharge status: Home / Self Care | Payer: Medicaid Other | Attending: Obstetrics & Gynecology | Admitting: Obstetrics & Gynecology

## 2020-01-30 ENCOUNTER — Other Ambulatory Visit: Payer: Self-pay

## 2020-01-30 DIAGNOSIS — Z711 Person with feared health complaint in whom no diagnosis is made: Secondary | ICD-10-CM | POA: Diagnosis not present

## 2020-01-30 DIAGNOSIS — J45909 Unspecified asthma, uncomplicated: Secondary | ICD-10-CM | POA: Diagnosis not present

## 2020-01-30 DIAGNOSIS — Z79899 Other long term (current) drug therapy: Secondary | ICD-10-CM | POA: Diagnosis not present

## 2020-01-30 DIAGNOSIS — R8271 Bacteriuria: Secondary | ICD-10-CM

## 2020-01-30 DIAGNOSIS — Z791 Long term (current) use of non-steroidal anti-inflammatories (NSAID): Secondary | ICD-10-CM | POA: Diagnosis not present

## 2020-01-30 DIAGNOSIS — Z87891 Personal history of nicotine dependence: Secondary | ICD-10-CM | POA: Diagnosis not present

## 2020-01-30 DIAGNOSIS — Z9889 Other specified postprocedural states: Secondary | ICD-10-CM | POA: Diagnosis not present

## 2020-01-30 DIAGNOSIS — Z98891 History of uterine scar from previous surgery: Secondary | ICD-10-CM

## 2020-01-30 NOTE — MAU Note (Signed)
Pt reports to mau after c section on 10/5 at [facility].  Pt reports her dressing came off after showering and she has noticed a foul odor and wanted to have it looked at.  Pt reports some soreness at the sight.

## 2020-01-30 NOTE — Discharge Instructions (Signed)
Incision Care, Adult An incision is a cut that a doctor makes in your skin for surgery (for a procedure). Most times, these cuts are closed after surgery. Your cut from surgery may be closed with stitches (sutures), staples, skin glue, or skin tape (adhesive strips). You may need to return to your doctor to have stitches or staples taken out. This may happen many days or many weeks after your surgery. The cut needs to be well cared for so it does not get infected. How to care for your cut Cut care   Follow instructions from your doctor about how to take care of your cut. Make sure you: ? Wash your hands with soap and water before you change your bandage (dressing). If you cannot use soap and water, use hand sanitizer. ? Change your bandage as told by your doctor. ? Leave stitches, skin glue, or skin tape in place. They may need to stay in place for 2 weeks or longer. If tape strips get loose and curl up, you may trim the loose edges. Do not remove tape strips completely unless your doctor says it is okay.  Check your cut area every day for signs of infection. Check for: ? More redness, swelling, or pain. ? More fluid or blood. ? Warmth. ? Pus or a bad smell.  Ask your doctor how to clean the cut. This may include: ? Using mild soap and water. ? Using a clean towel to pat the cut dry after you clean it. ? Putting a cream or ointment on the cut. Do this only as told by your doctor. ? Covering the cut with a clean bandage.  Ask your doctor when you can leave the cut uncovered.  Do not take baths, swim, or use a hot tub until your doctor says it is okay. Ask your doctor if you can take showers. You may only be allowed to take sponge baths for bathing. Medicines  If you were prescribed an antibiotic medicine, cream, or ointment, take the antibiotic or put it on the cut as told by your doctor. Do not stop taking or putting on the antibiotic even if your condition gets better.  Take  over-the-counter and prescription medicines only as told by your doctor. General instructions  Limit movement around your cut. This helps healing. ? Avoid straining, lifting, or exercise for the first month, or for as long as told by your doctor. ? Follow instructions from your doctor about going back to your normal activities. ? Ask your doctor what activities are safe.  Protect your cut from the sun when you are outside for the first 6 months, or for as long as told by your doctor. Put on sunscreen around the scar or cover up the scar.  Keep all follow-up visits as told by your doctor. This is important. Contact a doctor if:  Your have more redness, swelling, or pain around the cut.  You have more fluid or blood coming from the cut.  Your cut feels warm to the touch.  You have pus or a bad smell coming from the cut.  You have a fever or shaking chills.  You feel sick to your stomach (nauseous) or you throw up (vomit).  You are dizzy.  Your stitches or staples come undone. Get help right away if:  You have a red streak coming from your cut.  Your cut bleeds through the bandage and the bleeding does not stop with gentle pressure.  The edges of your cut   open up and separate.  You have very bad (severe) pain.  You have a rash.  You are confused.  You pass out (faint).  You have trouble breathing and you have a fast heartbeat. This information is not intended to replace advice given to you by your health care provider. Make sure you discuss any questions you have with your health care provider. Document Revised: 08/26/2016 Document Reviewed: 12/15/2015 Elsevier Patient Education  2020 Elsevier Inc.  

## 2020-01-30 NOTE — MAU Provider Note (Signed)
History     CSN: [id number]  Arrival date and time: 01/30/20 1656   First Provider Initiated Contact with Patient 01/30/20 1750      Chief Complaint  Patient presents with  . Incisional Pain   29 y.o. G2P2 s/p emergent repeat CS 5 days ago presenting d/t her honeycomb dressing coming off. States it fell off today. Denies drainage. Reports a slight smell since yesterday. Denies fevers. Having some incisional pain well controlled with meds.   OB History    Gravida  2   Para  2   Term  2   Preterm      AB      Living  2     SAB      TAB      Ectopic      Multiple  0   Live Births  2           Past Medical History:  Diagnosis Date  . Anemia   . Asthma     Past Surgical History:  Procedure Laterality Date  . CESAREAN SECTION N/A 08/17/2017   Procedure: CESAREAN SECTION;  Surgeon: Linzie Collin, MD;  Location: [facility] ORS;  Service: Obstetrics;  Laterality: N/A;  . CESAREAN SECTION  01/25/2020   Procedure: CESAREAN SECTION;  Surgeon: Hildred Laser, MD;  Location: [facility] ORS;  Service: Obstetrics;;  . dental implant    . WISDOM TOOTH EXTRACTION      Family History  Problem Relation Age of Onset  . Asthma Father   . Asthma Sister   . Hypertension Sister   . Rheum arthritis Paternal Grandmother   . Cancer Paternal Grandmother        lung  . Cirrhosis Maternal Grandmother   . Cancer Other        maternal great grandmother-leukemia  . Cancer Paternal Grandfather   . Breast cancer Neg Hx   . Ovarian cancer Neg Hx     Social History   Tobacco Use  . Smoking status: Former Smoker    Packs/day: 0.50    Types: Cigarettes    Quit date: 01/23/2017    Years since quitting: 3.0  . Smokeless tobacco: Never Used  . Tobacco comment: Already quit  Vaping Use  . Vaping Use: Former  Substance Use Topics  . Alcohol use: Not Currently    Alcohol/week: 2.0 standard drinks    Types: 2 Standard drinks or equivalent per week  . Drug use: No    Allergies:   Allergies  Allergen Reactions  . Citrus Dermatitis    Hypoallergenic dermatitis-tongue swells and itches  . Other     Pet dander    Medications Prior to Admission  Medication Sig Dispense Refill Last Dose  . ibuprofen (ADVIL) 600 MG tablet Take 1 tablet (600 mg total) by mouth every 6 (six) hours as needed for moderate pain or cramping. 30 tablet 0 01/30/2020 at 0600  . oxyCODONE (ROXICODONE) 5 MG immediate release tablet Take 1 tablet (5 mg total) by mouth every 8 (eight) hours as needed. 6 tablet 0 01/30/2020 at 1300  . albuterol (PROVENTIL HFA;VENTOLIN HFA) 108 (90 Base) MCG/ACT inhaler Inhale into the lungs every 6 (six) hours as needed for wheezing or shortness of breath. (Patient not taking: Reported on 01/24/2020)     . coconut oil OIL Apply 1 application topically as needed.  0   . loratadine-pseudoephedrine (CLARITIN-D 24 HOUR) 10-240 MG 24 hr tablet Take by mouth.     . oxyCODONE (ROXICODONE)  5 MG immediate release tablet Take 1 tablet (5 mg total) by mouth every 6 (six) hours as needed. 20 tablet 0   . Prenatal Vit-Fe Fumarate-FA (MULTIVITAMIN-PRENATAL) 27-0.8 MG TABS tablet Take 1 tablet by mouth daily at 12 noon.     . Vitamin D, Ergocalciferol, (DRISDOL) 1.25 MG (50000 UNIT) CAPS capsule Take 1 capsule (50,000 Units total) by mouth every 7 (seven) days. 14 capsule 2     Review of Systems  Constitutional: Negative for chills and fever.  Skin: Positive for wound.   Physical Exam   Blood pressure 120/71, pulse 65, temperature 98.6 F (37 C), temperature source Oral, resp. rate 16, last menstrual period 04/15/2019, SpO2 100 %, unknown if currently breastfeeding.  Physical Exam Vitals and nursing note reviewed.  Constitutional:      General: She is not in acute distress.    Appearance: Normal appearance.  HENT:     Head: Normocephalic and atraumatic.  Cardiovascular:     Rate and Rhythm: Normal rate.  Pulmonary:     Effort: Pulmonary effort is normal. No respiratory  distress.  Abdominal:     General: There is no distension.     Palpations: Abdomen is soft.     Tenderness: There is no abdominal tenderness.     Comments: Large pendulous abdomen Incision well approximated with staples. No erythema, edema, bruising, or drainage  Musculoskeletal:        General: Normal range of motion.     Cervical back: Normal range of motion.  Skin:    General: Skin is warm and dry.  Neurological:     General: No focal deficit present.     Mental Status: She is alert and oriented to person, place, and time.  Psychiatric:        Mood and Affect: Mood normal.        Behavior: Behavior normal.    No results found for this or any previous visit (from the past 24 hour(s)).  MAU Course  Procedures  MDM Pt arrived with gauze tucked under abdomen, appeared to feel moist when removed but no drainage visualized. Informed she is likely trapping normal skin moisture due to panis and recommend keeping clean and dry with clean peripads changed often-clean pads provided and placed. No signs of infection. Stable for discharge home.  Assessment and Plan  S/p cesarean section Worried well Discharge home Follow up at [facility] tomorrow as scheduled Return precautions  Allergies as of 01/30/2020      Reactions   Citrus Dermatitis   Hypoallergenic dermatitis-tongue swells and itches   Other    Pet dander      Medication List    TAKE these medications   albuterol 108 (90 Base) MCG/ACT inhaler Commonly known as: VENTOLIN HFA Inhale into the lungs every 6 (six) hours as needed for wheezing or shortness of breath.   Claritin-D 24 Hour 10-240 MG 24 hr tablet Generic drug: loratadine-pseudoephedrine Take by mouth.   coconut oil Oil Apply 1 application topically as needed.   ibuprofen 600 MG tablet Commonly known as: ADVIL Take 1 tablet (600 mg total) by mouth every 6 (six) hours as needed for moderate pain or cramping.   multivitamin-prenatal 27-0.8 MG Tabs tablet Take  1 tablet by mouth daily at 12 noon.   oxyCODONE 5 MG immediate release tablet Commonly known as: Roxicodone Take 1 tablet (5 mg total) by mouth every 8 (eight) hours as needed.   oxyCODONE 5 MG immediate release tablet Commonly known as:  Roxicodone Take 1 tablet (5 mg total) by mouth every 6 (six) hours as needed.   Vitamin D (Ergocalciferol) 1.25 MG (50000 UNIT) Caps capsule Commonly known as: DRISDOL Take 1 capsule (50,000 Units total) by mouth every 7 (seven) days.      Donette Larry, CNM 01/30/2020, 6:00 PM

## 2020-01-31 ENCOUNTER — Telehealth: Payer: Self-pay

## 2020-01-31 ENCOUNTER — Encounter: Payer: Self-pay | Admitting: Certified Nurse Midwife

## 2020-01-31 ENCOUNTER — Encounter: Payer: Medicaid Other | Admitting: Certified Nurse Midwife

## 2020-01-31 ENCOUNTER — Other Ambulatory Visit: Payer: Self-pay

## 2020-01-31 ENCOUNTER — Ambulatory Visit (INDEPENDENT_AMBULATORY_CARE_PROVIDER_SITE_OTHER): Payer: Medicaid Other | Admitting: Certified Nurse Midwife

## 2020-01-31 VITALS — BP 102/65 | HR 82 | Ht 60.0 in | Wt 152.5 lb

## 2020-01-31 DIAGNOSIS — Z98891 History of uterine scar from previous surgery: Secondary | ICD-10-CM

## 2020-01-31 DIAGNOSIS — Z4889 Encounter for other specified surgical aftercare: Secondary | ICD-10-CM

## 2020-01-31 MED ORDER — GABAPENTIN 400 MG PO CAPS: 400.0000 mg | ORAL_CAPSULE | Freq: Three times a day (TID) | ORAL | 0 refills | Status: DC

## 2020-01-31 NOTE — Progress Notes (Signed)
Pt present for incision check after c-section and postpartum depression screening. Pt stated having some anxiety due to baby being in NICU. PHQ-9=6.  GAD-7=8

## 2020-01-31 NOTE — Progress Notes (Signed)
    OBSTETRICS/GYNECOLOGY POST-OPERATIVE CLINIC VISIT  Subjective:     Natalie Beck is a 29 y.o. female who presents to the clinic 1 week status post repeat cesarean section for uterine rupture.   Eating a regular diet without difficulty. Bowel movements are normal. Pain is not well controlled.  Medications being used: ibuprofen (OTC) and narcotic analgesics including oxycodone (Oxycontin, Oxyir).   Denies difficulty breathing or respiratory distress, chest pain, dysuria, excessive vaginal bleeding and leg pain.   The following portions of the patient's history were reviewed and updated as appropriate: allergies, current medications, past family history, past medical history, past social history, past surgical history and problem list.  Review of Systems  Pertinent items are noted in HPI.   Objective:    BP 102/65   Pulse 82   Ht 5' (1.524 m)   Wt 152 lb 8 oz (69.2 kg)   Breastfeeding Yes   BMI 29.78 kg/m    General:  alert and no distress  Abdomen: soft, bowel sounds active, non-tender  Incision:   healing well, no drainage, no erythema, no hernia, no seroma, no swelling, mild dehiscence of skin layer   Depression screen [facility] 2/9 01/31/2020 10/05/2018 09/25/2017  Decreased Interest 1 2 1   Down, Depressed, Hopeless 1 1 1   PHQ - 2 Score 2 3 2   Altered sleeping 1 2 1   Tired, decreased energy 1 2 1   Change in appetite 0 2 0  Feeling bad or failure about yourself  1 1 1   Trouble concentrating 1 1 1   Moving slowly or fidgety/restless 0 0 0  Suicidal thoughts 0 0 0  PHQ-9 Score 6 11 6   Difficult doing work/chores Not difficult at all Somewhat difficult -   GAD 7 : Generalized Anxiety Score 01/31/2020 10/05/2018  Nervous, Anxious, on Edge 2 1  Control/stop worrying 1 2  Worry too much - different things 1 1  Trouble relaxing 2 1  Restless 0 0  Easily annoyed or irritable 1 1  Afraid - awful might happen 1 1  Total GAD 7 Score 8 7  Anxiety Difficulty Not difficult at  all Not difficult at all    Assessment:   Postoperative course complicated by mild wound dehiscence of skin layer  Infant in NICU   Plan:   Continue any current medications, add Gabapentin  Wound care discussed. Staples removed and dermabond applied.   Activity restrictions: no lifting more than 10 pounds.   Follow up: 1 weeks for incision check or sooner if needed.    , CNM [facility], [facility] 01/31/20 3:55 PM

## 2020-01-31 NOTE — Telephone Encounter (Signed)
Transition Care Management Follow-up Telephone Call  Date of discharge and from where: 01/30/2020 from [facility]  How have you been since you were released from the hospital? Patient states that she is feeling well.   Any questions or concerns? No  Items Reviewed:  Did the pt receive and understand the discharge instructions provided? Yes   Medications obtained and verified? Yes   Any new allergies since your discharge? Yes   Dietary orders reviewed? Yes  Do you have support at home? Yes   Functional Questionnaire: (I = Independent and D = Dependent) ADLs: I Bathing/Dressing- I Meal Prep- I Eating- I Maintaining continence- I Transferring/Ambulation- I Managing Meds- I  Follow up appointments reviewed:   Specialist Hospital f/u appt confirmed? Yes  Scheduled to see 02/07/2020 on Natalie Beck, CNM @ 2:45pm.  Are transportation arrangements needed? No   If their condition worsens, is the pt aware to call PCP or go to the Emergency Dept.? Yes  Was the patient provided with contact information for the PCP's office or ED? Yes  Was to pt encouraged to call back with questions or concerns? Yes

## 2020-01-31 NOTE — Patient Instructions (Signed)

## 2020-02-07 ENCOUNTER — Encounter: Payer: Self-pay | Admitting: Certified Nurse Midwife

## 2020-02-07 ENCOUNTER — Other Ambulatory Visit: Payer: Self-pay

## 2020-02-07 ENCOUNTER — Ambulatory Visit (INDEPENDENT_AMBULATORY_CARE_PROVIDER_SITE_OTHER): Payer: Medicaid Other | Admitting: Certified Nurse Midwife

## 2020-02-07 VITALS — BP 120/62 | Wt 237.3 lb

## 2020-02-07 DIAGNOSIS — Z98891 History of uterine scar from previous surgery: Secondary | ICD-10-CM

## 2020-02-07 DIAGNOSIS — Z711 Person with feared health complaint in whom no diagnosis is made: Secondary | ICD-10-CM

## 2020-02-07 DIAGNOSIS — Z4889 Encounter for other specified surgical aftercare: Secondary | ICD-10-CM

## 2020-02-07 NOTE — Patient Instructions (Signed)

## 2020-02-07 NOTE — Progress Notes (Signed)
    OBSTETRICS/GYNECOLOGY POST-OPERATIVE CLINIC VISIT  Subjective:     Natalie Beck is a 29 y.o. female who presents to the clinic 2 week status post repeat cesarean section for uterine rupture.   Eating a regular diet without difficulty. Bowel movements are normal. Pain is controlled with current analgesics. Medications being used: acetaminophen and ibuprofen (OTC).   Denies difficulty breathing or respiratory distress, chest pain, dysuria, excessive vaginal bleeding and leg pain.   The following portions of the patient's history were reviewed and updated as appropriate: allergies, current medications, past family history, past medical history, past social history, past surgical history and problem list.  Review of Systems  Pertinent items are noted in HPI.   Objective:    BP 120/62   Wt 237 lb 4.8 oz (107.6 kg)   Breastfeeding Yes   BMI 46.34 kg/m    General:  alert and no distress  Abdomen: soft, bowel sounds active, non-tender  Incision:   healing well, no drainage, no erythema, no hernia, no seroma, no swelling    Assessment:   Doing well postoperatively.  Infant in NICU, progressing well.   Plan:   Continue any current medications.  Wound care discussed. ABD pads given.   Activity restrictions: no lifting more than 10 pounds.   Follow up: 4 weeks for PPV or sooner if needed.    Gunnar Bulla, CNM [facility], [facility] 02/07/20 5:52 PM

## 2020-02-16 ENCOUNTER — Ambulatory Visit: Payer: Self-pay

## 2020-02-16 NOTE — Lactation Note (Signed)
This note was copied from a baby's chart. Lactation Consultation Note  Patient Name: Natalie Beck [id number] Date: 02/16/2020 Reason for consult: Follow-up assessment;NICU baby   LC to room for f/u visit. Mom currently pumping 4 times per day. Encouraged sts and increasing pumping frequency if possible. Mom has also attempted to latch baby. Will plan f/u prn.  Consult Status Consult Status: PRN Date: 02/17/20 Follow-up type: In-patient    Elder Negus 02/16/2020, 2:45 PM

## 2020-02-21 DIAGNOSIS — Z419 Encounter for procedure for purposes other than remedying health state, unspecified: Secondary | ICD-10-CM | POA: Diagnosis not present

## 2020-02-24 ENCOUNTER — Encounter: Payer: Medicaid Other | Admitting: Certified Nurse Midwife

## 2020-03-02 ENCOUNTER — Encounter: Payer: Medicaid Other | Admitting: Certified Nurse Midwife

## 2020-03-22 DIAGNOSIS — Z419 Encounter for procedure for purposes other than remedying health state, unspecified: Secondary | ICD-10-CM | POA: Diagnosis not present

## 2020-04-22 DIAGNOSIS — Z419 Encounter for procedure for purposes other than remedying health state, unspecified: Secondary | ICD-10-CM | POA: Diagnosis not present

## 2020-05-23 DIAGNOSIS — Z419 Encounter for procedure for purposes other than remedying health state, unspecified: Secondary | ICD-10-CM | POA: Diagnosis not present

## 2020-06-20 DIAGNOSIS — Z419 Encounter for procedure for purposes other than remedying health state, unspecified: Secondary | ICD-10-CM | POA: Diagnosis not present

## 2020-07-21 DIAGNOSIS — Z419 Encounter for procedure for purposes other than remedying health state, unspecified: Secondary | ICD-10-CM | POA: Diagnosis not present

## 2020-08-02 DIAGNOSIS — F4323 Adjustment disorder with mixed anxiety and depressed mood: Secondary | ICD-10-CM | POA: Diagnosis not present

## 2020-08-02 DIAGNOSIS — F4310 Post-traumatic stress disorder, unspecified: Secondary | ICD-10-CM | POA: Diagnosis not present

## 2020-08-09 DIAGNOSIS — F4323 Adjustment disorder with mixed anxiety and depressed mood: Secondary | ICD-10-CM | POA: Diagnosis not present

## 2020-08-09 DIAGNOSIS — F4310 Post-traumatic stress disorder, unspecified: Secondary | ICD-10-CM | POA: Diagnosis not present

## 2020-08-20 DIAGNOSIS — Z419 Encounter for procedure for purposes other than remedying health state, unspecified: Secondary | ICD-10-CM | POA: Diagnosis not present

## 2020-09-15 DIAGNOSIS — F4323 Adjustment disorder with mixed anxiety and depressed mood: Secondary | ICD-10-CM | POA: Diagnosis not present

## 2020-09-20 DIAGNOSIS — Z419 Encounter for procedure for purposes other than remedying health state, unspecified: Secondary | ICD-10-CM | POA: Diagnosis not present

## 2020-09-25 DIAGNOSIS — F4323 Adjustment disorder with mixed anxiety and depressed mood: Secondary | ICD-10-CM | POA: Diagnosis not present

## 2020-10-06 DIAGNOSIS — M2550 Pain in unspecified joint: Secondary | ICD-10-CM | POA: Diagnosis not present

## 2020-10-06 DIAGNOSIS — Z1329 Encounter for screening for other suspected endocrine disorder: Secondary | ICD-10-CM | POA: Diagnosis not present

## 2020-10-06 DIAGNOSIS — Z862 Personal history of diseases of the blood and blood-forming organs and certain disorders involving the immune mechanism: Secondary | ICD-10-CM | POA: Diagnosis not present

## 2020-10-06 DIAGNOSIS — R609 Edema, unspecified: Secondary | ICD-10-CM | POA: Diagnosis not present

## 2020-10-06 DIAGNOSIS — R5383 Other fatigue: Secondary | ICD-10-CM | POA: Diagnosis not present

## 2020-10-16 DIAGNOSIS — F4323 Adjustment disorder with mixed anxiety and depressed mood: Secondary | ICD-10-CM | POA: Diagnosis not present

## 2020-10-20 DIAGNOSIS — Z419 Encounter for procedure for purposes other than remedying health state, unspecified: Secondary | ICD-10-CM | POA: Diagnosis not present

## 2020-10-30 DIAGNOSIS — F4310 Post-traumatic stress disorder, unspecified: Secondary | ICD-10-CM | POA: Diagnosis not present

## 2020-11-06 DIAGNOSIS — F4323 Adjustment disorder with mixed anxiety and depressed mood: Secondary | ICD-10-CM | POA: Diagnosis not present

## 2020-11-24 DIAGNOSIS — F320 Major depressive disorder, single episode, mild: Secondary | ICD-10-CM | POA: Diagnosis not present

## 2020-11-24 DIAGNOSIS — R011 Cardiac murmur, unspecified: Secondary | ICD-10-CM | POA: Diagnosis not present

## 2020-11-24 DIAGNOSIS — H65413 Chronic allergic otitis media, bilateral: Secondary | ICD-10-CM | POA: Diagnosis not present

## 2020-11-24 DIAGNOSIS — R0683 Snoring: Secondary | ICD-10-CM | POA: Diagnosis not present

## 2020-11-29 DIAGNOSIS — F4323 Adjustment disorder with mixed anxiety and depressed mood: Secondary | ICD-10-CM | POA: Diagnosis not present

## 2020-12-12 DIAGNOSIS — F320 Major depressive disorder, single episode, mild: Secondary | ICD-10-CM | POA: Diagnosis not present

## 2020-12-12 DIAGNOSIS — L918 Other hypertrophic disorders of the skin: Secondary | ICD-10-CM | POA: Diagnosis not present

## 2020-12-19 DIAGNOSIS — N644 Mastodynia: Secondary | ICD-10-CM | POA: Diagnosis not present

## 2020-12-19 DIAGNOSIS — M7989 Other specified soft tissue disorders: Secondary | ICD-10-CM | POA: Diagnosis not present

## 2020-12-19 DIAGNOSIS — N63 Unspecified lump in unspecified breast: Secondary | ICD-10-CM | POA: Diagnosis not present

## 2020-12-19 DIAGNOSIS — R590 Localized enlarged lymph nodes: Secondary | ICD-10-CM | POA: Diagnosis not present

## 2021-01-22 DIAGNOSIS — M7989 Other specified soft tissue disorders: Secondary | ICD-10-CM | POA: Diagnosis not present

## 2021-01-22 DIAGNOSIS — N644 Mastodynia: Secondary | ICD-10-CM | POA: Diagnosis not present

## 2021-01-22 DIAGNOSIS — R011 Cardiac murmur, unspecified: Secondary | ICD-10-CM | POA: Diagnosis not present

## 2021-01-22 DIAGNOSIS — N6489 Other specified disorders of breast: Secondary | ICD-10-CM | POA: Diagnosis not present

## 2021-01-22 DIAGNOSIS — R221 Localized swelling, mass and lump, neck: Secondary | ICD-10-CM | POA: Diagnosis not present

## 2021-01-22 DIAGNOSIS — R590 Localized enlarged lymph nodes: Secondary | ICD-10-CM | POA: Diagnosis not present

## 2021-03-21 DIAGNOSIS — R590 Localized enlarged lymph nodes: Secondary | ICD-10-CM | POA: Diagnosis not present

## 2021-03-21 DIAGNOSIS — F320 Major depressive disorder, single episode, mild: Secondary | ICD-10-CM | POA: Diagnosis not present

## 2021-04-20 ENCOUNTER — Emergency Department (HOSPITAL_COMMUNITY)
Admission: EM | Admit: 2021-04-20 | Discharge: 2021-04-20 | Disposition: A | Discharge status: Home / Self Care | Payer: Medicaid Other | Attending: Emergency Medicine | Admitting: Emergency Medicine

## 2021-04-20 ENCOUNTER — Encounter (HOSPITAL_COMMUNITY): Payer: Self-pay

## 2021-04-20 ENCOUNTER — Other Ambulatory Visit: Payer: Self-pay

## 2021-04-20 DIAGNOSIS — U071 COVID-19: Secondary | ICD-10-CM | POA: Diagnosis not present

## 2021-04-20 DIAGNOSIS — M791 Myalgia, unspecified site: Secondary | ICD-10-CM | POA: Diagnosis present

## 2021-04-20 DIAGNOSIS — Z87891 Personal history of nicotine dependence: Secondary | ICD-10-CM | POA: Diagnosis not present

## 2021-04-20 DIAGNOSIS — J45909 Unspecified asthma, uncomplicated: Secondary | ICD-10-CM | POA: Diagnosis not present

## 2021-04-20 LAB — RESP PANEL BY RT-PCR (FLU A&B, COVID) ARPGX2
Influenza A by PCR: NEGATIVE
Influenza B by PCR: NEGATIVE
SARS Coronavirus 2 by RT PCR: POSITIVE — AB

## 2021-04-20 MED ORDER — ONDANSETRON 4 MG PO TBDP
4.0000 mg | ORAL_TABLET | Freq: Once | ORAL | Status: AC
  Administered 2021-04-20: 19:00:00 4 mg via ORAL
  Filled 2021-04-20: qty 1

## 2021-04-20 MED ORDER — IBUPROFEN 800 MG PO TABS
800.0000 mg | ORAL_TABLET | Freq: Once | ORAL | Status: AC
  Administered 2021-04-20: 20:00:00 800 mg via ORAL
  Filled 2021-04-20: qty 1

## 2021-04-20 NOTE — Discharge Instructions (Addendum)
You were seen in the emergency department today for body aches.  While you are here you were diagnosed with COVID-19.  Please quarantine per CDC guidelines as well as your work guidelines for returning to work.  You may also take Tylenol and ibuprofen as needed for body aches and fevers.  You may take over-the-counter cough and cold medications to treat your symptoms.  These continue to push lots of fluids and rest.  Please return to the emergency department if you begin having shortness of breath or respiratory distress.

## 2021-04-20 NOTE — ED Provider Notes (Signed)
[facility]-EMERGENCY DEPT Provider Note   CSN: [id number] Arrival date & time: 04/20/21  1808     History Chief Complaint  Patient presents with   Chills   Generalized Body Aches   Dizziness   Headache    Natalie Beck is a 30 y.o. female.  With past medical history of asthma who presents to the emergency department with generalized body aches.  She states that this morning she woke up feeling like she "got hit by a truck."  She states that she has had generalized body aches with alternating hot and cold chills as well as breaking out in sweats.  Additionally she states that she has had headache particularly on the right side.  She is unsure if she has had a fever.  She denies sick contacts.  She denies chest pain, shortness of breath, sore throat, rhinorrhea or congestion.  She then mentions that she has issues with "swelling all over."  She states that she has had this issue since she has not delivered her child.  She states that it has been bothering her lately   Dizziness Associated symptoms: headaches   Associated symptoms: no chest pain, no nausea, no palpitations, no shortness of breath and no vomiting   Headache Associated symptoms: congestion, dizziness and myalgias   Associated symptoms: no abdominal pain, no cough, no fever, no nausea, no sore throat and no vomiting       Past Medical History:  Diagnosis Date   Anemia    Asthma     Patient Active Problem List   Diagnosis Date Noted   Status post cesarean section 01/26/2020   History of chorioamnionitis    Rupture of uterus affecting labor    Vitamin D deficiency 07/27/2019   Elevated prolactin level 11/05/2018   History of low transverse cesarean section 09/25/2017   OBESITY, NOS 06/19/2006   RHINITIS, ALLERGIC 06/19/2006   ASTHMA, EXERCISE INDUCED 06/19/2006    Past Surgical History:  Procedure Laterality Date   CESAREAN SECTION N/A 08/17/2017   Procedure: CESAREAN SECTION;   Surgeon: Linzie Collin, MD;  Location: [facility] ORS;  Service: Obstetrics;  Laterality: N/A;   CESAREAN SECTION  01/25/2020   Procedure: CESAREAN SECTION;  Surgeon: Hildred Laser, MD;  Location: [facility] ORS;  Service: Obstetrics;;   dental implant     WISDOM TOOTH EXTRACTION       OB History     Gravida  2   Para  2   Term  2   Preterm      AB      Living  2      SAB      IAB      Ectopic      Multiple  0   Live Births  2           Family History  Problem Relation Age of Onset   Asthma Father    Asthma Sister    Hypertension Sister    Rheum arthritis Paternal Grandmother    Cancer Paternal Grandmother        lung   Cirrhosis Maternal Grandmother    Cancer Other        maternal great grandmother-leukemia   Cancer Paternal Grandfather    Breast cancer Neg Hx    Ovarian cancer Neg Hx     Social History   Tobacco Use   Smoking status: Former    Packs/day: 0.50    Types: Cigarettes    Quit  date: 01/23/2017    Years since quitting: 4.2   Smokeless tobacco: Never   Tobacco comments:    Already quit  Vaping Use   Vaping Use: Former  Substance Use Topics   Alcohol use: Not Currently    Alcohol/week: 2.0 standard drinks    Types: 2 Standard drinks or equivalent per week   Drug use: No    Home Medications Prior to Admission medications   Medication Sig Start Date End Date Taking? Authorizing Provider  albuterol (PROVENTIL HFA;VENTOLIN HFA) 108 (90 Base) MCG/ACT inhaler Inhale into the lungs every 6 (six) hours as needed for wheezing or shortness of breath.     [provider]  coconut oil OIL Apply 1 application topically as needed. 01/28/20   Weems, Raelyn Number, MD  gabapentin (NEURONTIN) 400 MG capsule Take 1 capsule (400 mg total) by mouth 3 (three) times daily for 10 days. 01/31/20 02/10/20  Lawhorn, Vanessa [place], CNM  loratadine-pseudoephedrine (CLARITIN-D 24 HOUR) 10-240 MG 24 hr tablet Take by mouth. 10/05/18   [provider]   Prenatal Vit-Fe Fumarate-FA (MULTIVITAMIN-PRENATAL) 27-0.8 MG TABS tablet Take 1 tablet by mouth daily at 12 noon.    [provider]  Vitamin D, Ergocalciferol, (DRISDOL) 1.25 MG (50000 UNIT) CAPS capsule Take 1 capsule (50,000 Units total) by mouth every 7 (seven) days. Patient not taking: Reported on 02/07/2020 07/30/19   Gunnar Bulla, CNM    Allergies    Citrus and Other  Review of Systems   Review of Systems  Constitutional:  Positive for appetite change and chills. Negative for fever.  HENT:  Positive for congestion. Negative for sore throat.   Respiratory:  Negative for cough and shortness of breath.   Cardiovascular:  Negative for chest pain and palpitations.  Gastrointestinal:  Negative for abdominal pain, nausea and vomiting.  Musculoskeletal:  Positive for myalgias.  Neurological:  Positive for dizziness and headaches. Negative for syncope.  All other systems reviewed and are negative.  Physical Exam Updated Vital Signs BP 129/71 (BP Location: Left Arm)    Pulse (!) 108    Temp 99.9 F (37.7 C) (Oral)    Resp 16    Ht 5' (1.524 m)    Wt 108.9 kg    LMP 03/30/2021    SpO2 96%    BMI 46.87 kg/m   Physical Exam Vitals and nursing note reviewed.  Constitutional:      General: She is not in acute distress.    Appearance: Normal appearance. She is well-developed. She is ill-appearing. She is not toxic-appearing.  HENT:     Head: Normocephalic and atraumatic.     Nose: Congestion present.     Mouth/Throat:     Mouth: Mucous membranes are moist.     Pharynx: Oropharynx is clear.  Eyes:     General: No scleral icterus.    Extraocular Movements: Extraocular movements intact.     Pupils: Pupils are equal, round, and reactive to light.  Cardiovascular:     Rate and Rhythm: Normal rate and regular rhythm.     Pulses: Normal pulses.     Heart sounds: Normal heart sounds.  Pulmonary:     Effort: Pulmonary effort is normal. No respiratory distress.      Breath sounds: Normal breath sounds.  Abdominal:     General: Bowel sounds are normal. There is no distension.     Palpations: Abdomen is soft.     Tenderness: There is no abdominal tenderness.  Musculoskeletal:  General: Normal range of motion.     Cervical back: Normal range of motion and neck supple.  Lymphadenopathy:     Cervical: Cervical adenopathy present.  Skin:    General: Skin is warm and dry.     Capillary Refill: Capillary refill takes less than 2 seconds.  Neurological:     General: No focal deficit present.     Mental Status: She is alert and oriented to person, place, and time. Mental status is at baseline.     Cranial Nerves: No cranial nerve deficit or facial asymmetry.  Psychiatric:        Mood and Affect: Mood normal.        Speech: Speech normal.        Behavior: Behavior normal.        Thought Content: Thought content normal.        Judgment: Judgment normal.    ED Results / Procedures / Treatments   Labs (all labs ordered are listed, but only abnormal results are displayed) Labs Reviewed  RESP PANEL BY RT-PCR (FLU A&B, COVID) ARPGX2 - Abnormal; Notable for the following components:      Result Value   SARS Coronavirus 2 by RT PCR POSITIVE (*)    All other components within normal limits    EKG None  Radiology No results found.  Procedures Procedures   Medications Ordered in ED Medications  ondansetron (ZOFRAN-ODT) disintegrating tablet 4 mg (4 mg Oral Given 04/20/21 1918)  ibuprofen (ADVIL) tablet 800 mg (800 mg Oral Given 04/20/21 2014)    ED Course  I have reviewed the triage vital signs and the nursing notes.  Pertinent labs & imaging results that were available during my care of the patient were reviewed by me and considered in my medical decision making (see chart for details).    MDM Rules/Calculators/A&P 30 year old female who presents to the emergency department with viral upper respiratory symptoms.  She has generalized  body aches which is her main symptom.  She was given ibuprofen here in the emergency department.  She also has intermittent nausea and given Zofran for this.  She has had some symptomatic relief. Given symptoms and swab for COVID and flu. COVID-19 positive. This is likely the etiology of her symptoms.  Her physical exam is otherwise unremarkable.  She does not have shortness of breath or chest pain.  She is in no acute respiratory distress.  No indication for imaging at this time.  No indication for labs at this time. Discussed with her that she can continue to take over-the-counter cough and cold medications as well as Tylenol and Motrin for fever and body aches.  She is instructed to quarantine and follow CDC guidelines as well as work guidelines for returning to work after [place]. Instructed to return to the emergency department for worsening shortness of breath or respiratory distress.  She verbalized understanding. As for her symptom of swelling all over.  She does have a history of lymphedema for which she has managed at family practice.  Encouraged her to follow-up with primary care provider regarding this.  No emergent needs regarding this at this time. Vitals are stable.  Safe for discharge  Final Clinical Impression(s) / ED Diagnoses Final diagnoses:  COVID-19    Rx / DC Orders ED Discharge Orders     None        Lenard Galloway 04/20/21 2143    Cheryll Cockayne, MD 05/02/21 2006

## 2021-04-20 NOTE — ED Triage Notes (Signed)
Patient c/o headache, lymphedema, chills, and generalized body aches that started this AM.

## 2021-04-20 NOTE — ED Provider Notes (Signed)
Emergency Medicine Provider Triage Evaluation Note  Natalie Beck , a 30 y.o. female  was evaluated in triage.  Pt complains of generalized body aches.  She states that she woke up this morning feeling like "I got hit by a [employer] truck."  She states that she has had generalized body aches, hot and cold chills as well as breaking out in sweats.  She also endorses right-sided headache.  She goes on to state that she has had issues with "swelling all over my body" since she has delivered her child.  She states that this comes and goes.  He denies sick contacts.  She denies chest pain, shortness of breath, sore throat, rhinorrhea or congestion.  Review of Systems  Positive: See above Negative:   Physical Exam  BP 129/71 (BP Location: Left Arm)    Pulse (!) 108    Temp 99.9 F (37.7 C) (Oral)    Resp 16    Ht 5' (1.524 m)    Wt 108.9 kg    LMP 03/30/2021    SpO2 96%    BMI 46.87 kg/m  Gen:   Awake, no distress   Resp:  Normal effort  MSK:   Moves extremities without difficulty  Other:    Medical Decision Making  Medically screening exam initiated at 7:12 PM.  Appropriate orders placed.  Avynn A Fiorentino was informed that the remainder of the evaluation will be completed by another provider, this initial triage assessment does not replace that evaluation, and the importance of remaining in the ED until their evaluation is complete.     Cristopher Peru, PA-C 04/20/21 1913    Cheryll Cockayne, MD 05/02/21 2005

## 2021-07-11 ENCOUNTER — Other Ambulatory Visit: Payer: Self-pay

## 2021-07-11 ENCOUNTER — Ambulatory Visit (HOSPITAL_COMMUNITY)
Admission: EM | Admit: 2021-07-11 | Discharge: 2021-07-12 | Disposition: A | Discharge status: Other Acute Inpatient Hospital | Payer: Medicaid Other | Attending: Nurse Practitioner | Admitting: Nurse Practitioner

## 2021-07-11 DIAGNOSIS — F332 Major depressive disorder, recurrent severe without psychotic features: Secondary | ICD-10-CM | POA: Insufficient documentation

## 2021-07-11 DIAGNOSIS — Z6379 Other stressful life events affecting family and household: Secondary | ICD-10-CM | POA: Insufficient documentation

## 2021-07-11 DIAGNOSIS — Z20822 Contact with and (suspected) exposure to covid-19: Secondary | ICD-10-CM | POA: Insufficient documentation

## 2021-07-11 DIAGNOSIS — R45851 Suicidal ideations: Secondary | ICD-10-CM | POA: Insufficient documentation

## 2021-07-11 LAB — POCT URINE DRUG SCREEN - MANUAL ENTRY (I-SCREEN)
POC Amphetamine UR: NOT DETECTED
POC Buprenorphine (BUP): NOT DETECTED
POC Cocaine UR: NOT DETECTED
POC Marijuana UR: POSITIVE — AB
POC Methadone UR: NOT DETECTED
POC Methamphetamine UR: NOT DETECTED
POC Morphine: NOT DETECTED
POC Oxazepam (BZO): NOT DETECTED
POC Oxycodone UR: NOT DETECTED
POC Secobarbital (BAR): NOT DETECTED

## 2021-07-11 LAB — CBC WITH DIFFERENTIAL/PLATELET
Abs Immature Granulocytes: 0.02 10*3/uL (ref 0.00–0.07)
Basophils Absolute: 0 10*3/uL (ref 0.0–0.1)
Basophils Relative: 1 %
Eosinophils Absolute: 0.3 10*3/uL (ref 0.0–0.5)
Eosinophils Relative: 3 %
HCT: 31.7 % — ABNORMAL LOW (ref 36.0–46.0)
Hemoglobin: 9.7 g/dL — ABNORMAL LOW (ref 12.0–15.0)
Immature Granulocytes: 0 %
Lymphocytes Relative: 40 %
Lymphs Abs: 3.1 10*3/uL (ref 0.7–4.0)
MCH: 24.6 pg — ABNORMAL LOW (ref 26.0–34.0)
MCHC: 30.6 g/dL (ref 30.0–36.0)
MCV: 80.3 fL (ref 80.0–100.0)
Monocytes Absolute: 0.4 10*3/uL (ref 0.1–1.0)
Monocytes Relative: 5 %
Neutro Abs: 4.1 10*3/uL (ref 1.7–7.7)
Neutrophils Relative %: 51 %
Platelets: 321 10*3/uL (ref 150–400)
RBC: 3.95 MIL/uL (ref 3.87–5.11)
RDW: 16.2 % — ABNORMAL HIGH (ref 11.5–15.5)
WBC: 7.9 10*3/uL (ref 4.0–10.5)
nRBC: 0 % (ref 0.0–0.2)

## 2021-07-11 LAB — HEMOGLOBIN A1C
Hgb A1c MFr Bld: 5.4 % (ref 4.8–5.6)
Mean Plasma Glucose: 108.28 mg/dL

## 2021-07-11 LAB — POCT PREGNANCY, URINE: Preg Test, Ur: NEGATIVE

## 2021-07-11 LAB — POC SARS CORONAVIRUS 2 AG: SARSCOV2ONAVIRUS 2 AG: NEGATIVE

## 2021-07-11 LAB — POC SARS CORONAVIRUS 2 AG -  ED: SARS Coronavirus 2 Ag: NEGATIVE

## 2021-07-11 MED ORDER — ACETAMINOPHEN 325 MG PO TABS: 650.0000 mg | ORAL_TABLET | Freq: Four times a day (QID) | ORAL | Status: DC | PRN

## 2021-07-11 MED ORDER — MAGNESIUM HYDROXIDE 400 MG/5ML PO SUSP: 30.0000 mL | Freq: Every day | ORAL | Status: DC | PRN

## 2021-07-11 MED ORDER — FLUOXETINE HCL 10 MG PO CAPS
10.0000 mg | ORAL_CAPSULE | Freq: Every day | ORAL | Status: DC
  Administered 2021-07-11 – 2021-07-12 (×2): 10 mg via ORAL
  Filled 2021-07-11 (×2): qty 1

## 2021-07-11 MED ORDER — HYDROXYZINE HCL 25 MG PO TABS
25.0000 mg | ORAL_TABLET | Freq: Three times a day (TID) | ORAL | Status: DC | PRN
  Administered 2021-07-11: 25 mg via ORAL
  Filled 2021-07-11: qty 1

## 2021-07-11 MED ORDER — TRAZODONE HCL 50 MG PO TABS
50.0000 mg | ORAL_TABLET | Freq: Every evening | ORAL | Status: DC | PRN
  Administered 2021-07-11: 50 mg via ORAL
  Filled 2021-07-11: qty 1

## 2021-07-11 MED ORDER — ALUM & MAG HYDROXIDE-SIMETH 200-200-20 MG/5ML PO SUSP: 30.0000 mL | ORAL | Status: DC | PRN
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
## 2021-07-11 NOTE — Progress Notes (Signed)
Pt with some SI, no current plan.  She has a lot of stressors in her life.  Pt is starting to rationalize committing suicide.  Pt has no previous hx of attempts.  Pt denies HI or A/V hallucinations.  Pt is urgent.  Disposition to be determined. ?

## 2021-07-12 ENCOUNTER — Inpatient Hospital Stay (HOSPITAL_COMMUNITY)
Admission: AD | Admit: 2021-07-12 | Discharge: 2021-07-15 | DRG: 885 | Disposition: A | Discharge status: Home / Self Care | Payer: Medicaid Other | Source: Intra-hospital | Attending: Psychiatry | Admitting: Psychiatry

## 2021-07-12 ENCOUNTER — Encounter (HOSPITAL_COMMUNITY): Payer: Self-pay | Admitting: Nurse Practitioner

## 2021-07-12 DIAGNOSIS — Z20822 Contact with and (suspected) exposure to covid-19: Secondary | ICD-10-CM | POA: Diagnosis present

## 2021-07-12 DIAGNOSIS — G4700 Insomnia, unspecified: Secondary | ICD-10-CM | POA: Diagnosis present

## 2021-07-12 DIAGNOSIS — F332 Major depressive disorder, recurrent severe without psychotic features: Principal | ICD-10-CM | POA: Diagnosis present

## 2021-07-12 DIAGNOSIS — Z91199 Patient's noncompliance with other medical treatment and regimen due to unspecified reason: Secondary | ICD-10-CM | POA: Diagnosis not present

## 2021-07-12 DIAGNOSIS — F17210 Nicotine dependence, cigarettes, uncomplicated: Secondary | ICD-10-CM | POA: Diagnosis present

## 2021-07-12 DIAGNOSIS — F411 Generalized anxiety disorder: Secondary | ICD-10-CM | POA: Diagnosis present

## 2021-07-12 DIAGNOSIS — F909 Attention-deficit hyperactivity disorder, unspecified type: Secondary | ICD-10-CM

## 2021-07-12 DIAGNOSIS — Z79899 Other long term (current) drug therapy: Secondary | ICD-10-CM | POA: Diagnosis not present

## 2021-07-12 DIAGNOSIS — K30 Functional dyspepsia: Secondary | ICD-10-CM | POA: Diagnosis present

## 2021-07-12 DIAGNOSIS — F4310 Post-traumatic stress disorder, unspecified: Secondary | ICD-10-CM | POA: Diagnosis present

## 2021-07-12 DIAGNOSIS — Z818 Family history of other mental and behavioral disorders: Secondary | ICD-10-CM

## 2021-07-12 DIAGNOSIS — R45851 Suicidal ideations: Secondary | ICD-10-CM | POA: Diagnosis present

## 2021-07-12 LAB — COMPREHENSIVE METABOLIC PANEL
ALT: 16 U/L (ref 0–44)
AST: 17 U/L (ref 15–41)
Albumin: 3.6 g/dL (ref 3.5–5.0)
Alkaline Phosphatase: 79 U/L (ref 38–126)
Anion gap: 8 (ref 5–15)
BUN: 13 mg/dL (ref 6–20)
CO2: 23 mmol/L (ref 22–32)
Calcium: 9 mg/dL (ref 8.9–10.3)
Chloride: 108 mmol/L (ref 98–111)
Creatinine, Ser: 0.79 mg/dL (ref 0.44–1.00)
GFR, Estimated: >60 mL/min (ref >60)
Glucose, Bld: 129 mg/dL — ABNORMAL HIGH (ref 70–99)
Potassium: 3.9 mmol/L (ref 3.5–5.1)
Sodium: 139 mmol/L (ref 135–145)
Total Bilirubin: 0.4 mg/dL (ref 0.3–1.2)
Total Protein: 6.7 g/dL (ref 6.5–8.1)

## 2021-07-12 LAB — LIPID PANEL
Cholesterol: 193 mg/dL (ref 0–200)
HDL: 39 mg/dL — ABNORMAL LOW (ref >40)
LDL Cholesterol: 119 mg/dL — ABNORMAL HIGH (ref 0–99)
Total CHOL/HDL Ratio: 4.9 RATIO
Triglycerides: 177 mg/dL — ABNORMAL HIGH (ref <150)
VLDL: 35 mg/dL (ref 0–40)

## 2021-07-12 LAB — RESP PANEL BY RT-PCR (FLU A&B, COVID) ARPGX2
Influenza A by PCR: NEGATIVE
Influenza B by PCR: NEGATIVE
SARS Coronavirus 2 by RT PCR: NEGATIVE

## 2021-07-12 LAB — TSH: TSH: 1.119 u[IU]/mL (ref 0.350–4.500)

## 2021-07-12 LAB — ETHANOL: Alcohol, Ethyl (B): <10 mg/dL (ref <10)

## 2021-07-12 MED ORDER — FLUOXETINE HCL 10 MG PO CAPS
10.0000 mg | ORAL_CAPSULE | Freq: Every day | ORAL | Status: DC
  Filled 2021-07-12 (×3): qty 1

## 2021-07-12 MED ORDER — ACETAMINOPHEN 325 MG PO TABS: 650.0000 mg | ORAL_TABLET | Freq: Four times a day (QID) | ORAL | Status: DC | PRN

## 2021-07-12 MED ORDER — NICOTINE 14 MG/24HR TD PT24
14.0000 mg | MEDICATED_PATCH | Freq: Every day | TRANSDERMAL | Status: DC
  Filled 2021-07-12 (×5): qty 1

## 2021-07-12 MED ORDER — FLUOXETINE HCL 20 MG PO CAPS
20.0000 mg | ORAL_CAPSULE | Freq: Every day | ORAL | Status: DC
  Administered 2021-07-13 – 2021-07-15 (×3): 20 mg via ORAL
  Filled 2021-07-12 (×4): qty 1

## 2021-07-12 MED ORDER — GABAPENTIN 100 MG PO CAPS
100.0000 mg | ORAL_CAPSULE | Freq: Three times a day (TID) | ORAL | Status: DC
  Administered 2021-07-12 – 2021-07-15 (×9): 100 mg via ORAL
  Filled 2021-07-12 (×15): qty 1

## 2021-07-12 MED ORDER — ALUM & MAG HYDROXIDE-SIMETH 200-200-20 MG/5ML PO SUSP: 30.0000 mL | ORAL | Status: DC | PRN

## 2021-07-12 MED ORDER — MAGNESIUM HYDROXIDE 400 MG/5ML PO SUSP: 30.0000 mL | Freq: Every day | ORAL | Status: DC | PRN

## 2021-07-12 MED ORDER — TRAZODONE HCL 50 MG PO TABS
50.0000 mg | ORAL_TABLET | Freq: Every evening | ORAL | Status: DC | PRN
  Administered 2021-07-12 – 2021-07-14 (×3): 50 mg via ORAL
  Filled 2021-07-12 (×3): qty 1

## 2021-07-12 MED ORDER — HYDROXYZINE HCL 25 MG PO TABS
25.0000 mg | ORAL_TABLET | Freq: Three times a day (TID) | ORAL | Status: DC | PRN
  Administered 2021-07-12 – 2021-07-14 (×3): 25 mg via ORAL
  Filled 2021-07-12 (×3): qty 1

## 2021-07-12 MED ORDER — FLUOXETINE HCL 10 MG PO CAPS: 10.0000 mg | ORAL_CAPSULE | Freq: Every day | ORAL | 0 refills | Status: DC
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
## 2021-07-12 NOTE — Progress Notes (Signed)
Patient received morning medication of Prozac 10 mg.  ?
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
## 2021-07-12 NOTE — Tx Team (Signed)
Initial Treatment Plan ?07/12/2021 ?1:15 PM ?Natalie Beck ?CHY:[id number] ? ? ? ?PATIENT STRESSORS: ?Financial difficulties   ?Health problems   ?Other: Motherhood   ? ? ?PATIENT STRENGTHS: ?Communication skills  ?Motivation for treatment/growth  ?Supportive family/friends  ? ? ?PATIENT IDENTIFIED PROBLEMS: ?"Emotional stability"  ?"To not have SI  ?Suicidal ideation  ?Depression  ?Anxiety  ?  ?  ?  ?  ?  ? ?DISCHARGE CRITERIA:  ?Ability to meet basic life and health needs ?Adequate post-discharge living arrangements ?Motivation to continue treatment in a less acute level of care ? ?PRELIMINARY DISCHARGE PLAN: ?Outpatient therapy ?Return to previous living arrangement ? ?PATIENT/FAMILY INVOLVEMENT: ?This treatment plan has been presented to and reviewed with the patient, Natalie Beck, and/or family member.  The patient and family have been given the opportunity to ask questions and make suggestions. ? ?Clarene Critchley, RN ?07/12/2021, 1:15 PM ?

## 2021-07-12 NOTE — Discharge Instructions (Signed)
Transfer to [facility] 

## 2021-07-12 NOTE — [facility] Suicide Risk Assessment (Signed)
[facility] INPATIENT:  Family/Significant Other Suicide Prevention Education ? ?Suicide Prevention Education:  ?Education Completed;  Fiance Briscoe Deutscher [phone]), has been identified by the patient as the family member/significant other with whom the patient will be residing, and identified as the person(s) who will aid the patient in the event of a mental health crisis (suicidal ideations/suicide attempt).  With written consent from the patient, the family member/significant other has been provided the following suicide prevention education, prior to the and/or following the discharge of the patient. ? ?Patients spouse states that this patient lost her dad a year ago around her birthday. She also had a complicated pregnancy and birth of their son a year ago. Their son has been in and out of the hospital over the past few months. He works as a Naval architect and is gone a for a week or two at a time. She is left alone a lot with the children. He believes this has been "messing with her mentally." ? ?Patients fiance has no safety concerns with her returning home and states all guns are locked up.  ? ?The suicide prevention education provided includes the following: ?Suicide risk factors ?Suicide prevention and interventions ?National Suicide Hotline telephone number ?[facility] assessment telephone number ?[facility] Emergency Assistance 911 ?[place] and/or Residential Mobile Crisis Unit telephone number ? ?Request made of family/significant other to: ?Remove weapons (e.g., guns, rifles, knives), all items previously/currently identified as safety concern.   ?Remove drugs/medications (over-the-counter, prescriptions, illicit drugs), all items previously/currently identified as a safety concern. ? ?The family member/significant other verbalizes understanding of the suicide prevention education information provided.  The family member/significant other agrees to remove the items of safety  concern listed above. ? ?Natalie Beck ?07/12/2021, 1:56 PM ?

## 2021-07-12 NOTE — ED Notes (Signed)
RN called report to [facility] RN, [place]. ?

## 2021-07-12 NOTE — ED Notes (Signed)
Discharged from [facility] and being transported by [employer] [facility] to [facility].  ?

## 2021-07-12 NOTE — [facility] Group Notes (Signed)
Adult Psychoeducational Group Note ? ?Date:  07/12/2021 ?Time:  10:41 PM ? ?Group Topic/Focus:  ?Wrap-Up Group:   The focus of this group is to help patients review their daily goal of treatment and discuss progress on daily workbooks. ? ?Participation Level:  Minimal ? ?Participation Quality:  Appropriate ? ?Affect:  Appropriate ? ?Cognitive:  Alert ? ?Insight: Improving ? ?Engagement in Group:  Engaged ? ?Modes of Intervention:  Discussion ? ?Additional Comments:  Pt had brief discussion with this Clinical research associate. Pt shared that she was admitted earlier today. She rated her day 4/10 and felt that if there was a little more to do that it may help improve her day. ? ?Natalie Beck ?07/12/2021, 10:41 PM ?
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
## 2021-07-12 NOTE — BH Assessment (Addendum)
Per [facility] [name], his patient is accepted to [id number] by [name]. [place] attending. dx mdd recurrent severe without psychosis. please pre admit this patient. Fax vol consent to [phone]. Thanks Pt to arrive after 0900 this morning ?

## 2021-07-12 NOTE — Progress Notes (Signed)
Admission Note: Patient is a 31 year old female admitted to the unit voluntarily from [facility] for symptoms of depression and suicidal ideation with no plan.  Reports worsening depression since giving birth to her 45 months old child.  Baby is sick and has medical issues.  Patient presents with a flat affect and depressed mood.  Stated she is here to work on her emotional stability and to not have SI thoughts.  Admission plan of care review with consent signed.  Skin assessment and personal belongings completed.  Skin is dry and intact.  No contraband found.  Patient oriented to the unit, staff and room.  Verbalizes understanding of unit rules/protocols.  Routine safety checks initiated.  Patient is safe on the unit. ?

## 2021-07-12 NOTE — [facility] Suicide Risk Assessment (Addendum)
Suicide Risk Assessment ? ?Admission Assessment    ?[facility] Admission Suicide Risk Assessment ? ? ?Nursing information obtained from:  Patient ?Demographic factors:  Adolescent or young adult ?Current Mental Status:  Self-harm thoughts ?Loss Factors:  Financial problems / change in socioeconomic status, Decline in physical health ?Historical Factors:  Prior suicide attempts ?Risk Reduction Factors:  Living with another person, especially a relative ? ?Total Time spent with patient: 1.5 hours ?Principal Problem: MDD (major depressive disorder), recurrent severe, without psychosis (HCC) ?Diagnosis:  Principal Problem: ?  MDD (major depressive disorder), recurrent severe, without psychosis (HCC) ?Active Problems: ?  MDD (major depressive disorder), recurrent episode, severe (HCC) ?  Attention deficit hyperactivity disorder (ADHD) ?  GAD (generalized anxiety disorder) ?  PTSD (post-traumatic stress disorder) ? ?History of Present Illness: Natalie Beck is a 31 y.o [place] American female who presented to the [employer] health urgent care [facility]) with worsening depression and suicidal thoughts in the setting of feeling overwhelmed taking care of her two young children, one of them with a complex medical condition.  She reported a prior mental health diagnosis of depression by her primary care provider, and denies having a mental health provider. This is her first inpatient mental health hospitalization.  She was transferred to this [facility] for management of her depressive symptoms. ? ?Continued Clinical Symptoms:  ?  ?The "Alcohol Use Disorders Identification Test", Guidelines for Use in Primary Care, Second Edition.  World Science writer [facility]). ?Score between 0-7:  no or low risk or alcohol related problems. ?Score between 8-15:  moderate risk of alcohol related problems. ?Score between 16-19:  high risk of alcohol related problems. ?Score 20 or above:  warrants further  diagnostic evaluation for alcohol dependence and treatment. ? ? ?CLINICAL FACTORS: She reports suicidal thoughts prior to coming to the hospital.  She reports crying spells, irritability, sadness, feelings of hopelessness, feelings of worthlessness, and feelings of helplessness. She also reports insomnia, irritability, feelings of frustration, anxiety, decreased energy levels, feeling  overwhelmed, and having recurrent thoughts of death for the past year, worsening over the past two weeks. ? Depression:   Anhedonia ?Hopelessness ?Insomnia ? ?Musculoskeletal: ?Strength & Muscle Tone: within normal limits ?Gait & Station: normal ?Patient leans: N/A ? ?Psychiatric Specialty Exam: ? ?Presentation  ?General Appearance: Appropriate for Environment ? ?Eye Contact:Fair ? ?Speech:Clear and Coherent ? ?Speech Volume:Normal ? ?Handedness:Right ? ?Mood and Affect  ?Mood:Depressed; Anxious; Irritable ? ?Affect:Congruent ? ?Thought Process  ?Thought Processes:Coherent ? ?Descriptions of Associations:Intact ? ?Orientation:Full (Time, Place and Person) ? ?Thought Content:Logical ? ?History of Schizophrenia/Schizoaffective disorder:No ? ?Duration of Psychotic Symptoms:No data recorded ?Hallucinations:Hallucinations: None ? ?Ideas of Reference:None ? ?Suicidal Thoughts:Suicidal Thoughts: No ? ?Homicidal Thoughts:Homicidal Thoughts: No ? ?Sensorium  ?Memory:Immediate Good ? ?Judgment:Fair ? ?Insight:Fair ? ?Executive Functions  ?Concentration:Fair ? ?Attention Span:Fair ? ?Recall:Fair ? ?Fund of Knowledge:Fair ? ?Language:Fair ? ?Psychomotor Activity  ?Psychomotor Activity:Psychomotor Activity: Normal ? ?Assets  ?Assets:Communication Skills ? ?Sleep  ?Sleep:Sleep: Poor ? ?Physical Exam: ?Physical Exam ?Constitutional:   ?   Appearance: Normal appearance.  ?HENT:  ?   Head: Normocephalic.  ?   Nose: Nose normal. No congestion or rhinorrhea.  ?Eyes:  ?   Pupils: Pupils are equal, round, and reactive to light.  ?Pulmonary:  ?   Effort:  Pulmonary effort is normal. No respiratory distress.  ?Musculoskeletal:     ?   General: No swelling. Normal range of motion.  ?   Cervical back: Normal range of  motion.  ?Neurological:  ?   Mental Status: She is alert and oriented to person, place, and time.  ?Psychiatric:     ?   Behavior: Behavior normal.  ? ?Review of Systems  ?Constitutional: Negative.  Negative for fever.  ?HENT: Negative.  Negative for sore throat.   ?Eyes: Negative.   ?Respiratory: Negative.  Negative for cough.   ?Cardiovascular: Negative.  Negative for chest pain and palpitations.  ?Gastrointestinal:  Negative for heartburn and nausea.  ?Genitourinary: Negative.   ?Musculoskeletal: Negative.   ?Skin: Negative.   ?Neurological: Negative.   ?Endo/Heme/Allergies:   ?     Allergies ?Citrus  ?Psychiatric/Behavioral:  Positive for depression. The patient is nervous/anxious and has insomnia.   ?Blood pressure 109/62, pulse (!) 58, temperature 98.8 ?F (37.1 ?C), temperature source Oral, resp. rate 18, height 5' (1.524 m), weight 108 kg, SpO2 100 %, currently breastfeeding. Body mass index is 46.48 kg/m?. ? ? ?COGNITIVE FEATURES THAT CONTRIBUTE TO RISK:  ?None   ? ?SUICIDE RISK:  ? Moderate:  Frequent suicidal ideation with limited intensity, and duration, some specificity in terms of plans, no associated intent, good self-control, limited dysphoria/symptomatology, some risk factors present, and identifiable protective factors, including available and accessible social support. ? ?PLAN OF CARE:  ?PLAN ?Safety and Monitoring: ?Voluntary admission to inpatient psychiatric unit for safety, stabilization and treatment ?Daily contact with patient to assess and evaluate symptoms and progress in treatment ?Patient's case to be discussed in multi-disciplinary team meeting ?Observation Level : q15 minute checks ?Vital signs: q12 hours ?Precautions: suicide, elopement, and assault ?  ?Long Term Goal(s): Improvement in symptoms so as ready for discharge ?   ?Short Term Goals: Ability to identify changes in lifestyle to reduce recurrence of condition will improve, Ability to verbalize feelings will improve, Ability to disclose and discuss suicidal ideas, Ability to demonstrate self-control will improve, Ability to identify and develop effective coping behaviors will improve, Ability to maintain clinical measurements within normal limits will improve, Compliance with prescribed medications will improve, and Ability to identify triggers associated with substance abuse/mental health issues will improve ?  ?Physician Treatment Plan for Secondary Diagnosis:  ?Principal Problem: ?  MDD (major depressive disorder), recurrent severe, without psychosis (HCC) ?Active Problems: ?  MDD (major depressive disorder), recurrent episode, severe (HCC) ?  Attention deficit hyperactivity disorder (ADHD) ?  GAD (generalized anxiety disorder) ?  PTSD (post-traumatic stress disorder) ?   ?Severe recurrent major depressive disorder without psychotic features (HCC) ?-Continue Prozac & increase to 20 mg daily  ?  ?Anxiety ?-Continue Hydroxyzine 25 mg every 6 hours PRN ?-Start Gabapentin 100 mg TID ?  ?Insomnia ?-Start Trazodone 50 mg nightly PRN ?  ?Nicotine Addiction ?Start Nicotine patch 14 mg transdermal daily ?  ?Other PRNS ?-Continue Tylenol 650 mg every 6 hours PRN for mild pain ?-Continue Maalox 30 mg every 4 hrs PRN for indigestion ?-Continue Milk of Magnesia as needed every 6 hrs for constipation ?  ? Discharge Planning: ?Social work and case management to assist with discharge planning and identification of hospital follow-up needs prior to discharge ?Estimated LOS: 5-7 days ?Discharge Concerns: Need to establish a safety plan; Medication compliance and effectiveness ?Discharge Goals: Return home with outpatient referrals for mental health follow-up including medication management/psychotherapy ? ?I certify that inpatient services furnished can reasonably be expected to improve the  patient's condition.  ? ?Starleen Blue, NP ?07/12/2021, 4:49 PM ?

## 2021-07-12 NOTE — ED Provider Notes (Signed)
FBC/OBS ASAP Discharge Summary ? ?Date and Time: 07/12/2021 10:34 AM  ?Name: Natalie Beck  ?MRN:  [id number]  ? ?Discharge Diagnoses:  ?Final diagnoses:  ?MDD (major depressive disorder), recurrent severe, without psychosis ([place])  ? ? ?Subjective: Natalie Beck is a 31 y.o. female who presents to [facility] voluntarily due to worsening depression and suicidal thoughts. Patient reports that she has been feeling "emotionally unregulated" since her 60 month old son returned home from the NICU at one month of age. She reports that her son has medical issues related to complications during labor. She reports that she feels that she was not prepared to have a child with medical issues. She reports continued guilt and worthlessness since the birth of her son. She states that she has been having intermittent suicidal thoughts for some time, but has been able to distract herself when she has those thoughts. She states that she is no longer able to distract herself when she is having suicidal thoughts. She states that she has had thoughts of cutting her wrists, overdosing, and a car accident. She denies having developed a specific plan and states "I really don't want to do it." She denies a history of suicide attempts. Patient endorses depressive symptoms, including low mood, impaired sleep, loss of interest in pleasurable activities, feelings of guilt/worthlessness/hopelessness, problems with energy, and problems with concentration.  ? ?Stay Summary: Patient was admitted to the [facility] continuous assessment recommended for inpatient psychiatric treatment. Labs obtained includes: CBC, CMP, BAL, UDS, A1c, lipid panel, TSH, COVID, a urine pregnancy. UDS positive for THC. Urine preg is negative. Patient was started on Prozac 10 mg p.o. daily for depression.  ? ?Patient seen and evaluated face-to-face by this provider, chart reviewed and case discussed with Dr. Serafina Mitchell. On evaluation, patient is alert and oriented x4. Her thought  process is logical and speech is clear and coherent. Her mood is depressed and affect is congruent. Patient endorses passive suicidal ideations with no plan or intent at this time. Patient unable to contract for safety.  Patient denies homicidal ideations. Patient denies auditory and visual hallucinations. There is no objective evidence that the patient is currently responding to internal or external stimuli. Patient endorses depressive symptoms of sadness, worthlessness, hopelessness, guilt, crying spells, and isolation. Patient denies side effects to Prozac at this time. ? ? ?Total Time spent with patient: 15 minutes ? ?Past Psychiatric History: Depression, prescribed sertraline by her PCP. No history of inpatient psychiatric admissions. ? ?Past Medical History:  ?Past Medical History:  ?Diagnosis Date  ? Anemia   ? Asthma   ?  ?Past Surgical History:  ?Procedure Laterality Date  ? CESAREAN SECTION N/A 08/17/2017  ? Procedure: CESAREAN SECTION;  Surgeon: Harlin Heys, MD;  Location: [facility] ORS;  Service: Obstetrics;  Laterality: N/A;  ? CESAREAN SECTION  01/25/2020  ? Procedure: CESAREAN SECTION;  Surgeon: Rubie Maid, MD;  Location: [facility] ORS;  Service: Obstetrics;;  ? dental implant    ? WISDOM TOOTH EXTRACTION    ? ?Family History:  ?Family History  ?Problem Relation Age of Onset  ? Asthma Father   ? Asthma Sister   ? Hypertension Sister   ? Rheum arthritis Paternal Grandmother   ? Cancer Paternal Grandmother   ?     lung  ? Cirrhosis Maternal Grandmother   ? Cancer Other   ?     maternal great grandmother-leukemia  ? Cancer Paternal Grandfather   ? Breast cancer Neg Hx   ? Ovarian cancer  Neg Hx   ? ?Family Psychiatric History: No hx reported.  ?Social History:  ?Social History  ? ?Substance and Sexual Activity  ?Alcohol Use Not Currently  ? Alcohol/week: 2.0 standard drinks  ? Types: 2 Standard drinks or equivalent per week  ?   ?Social History  ? ?Substance and Sexual Activity  ?Drug Use No  ?  ?Social  History  ? ?Socioeconomic History  ? Marital status: Significant Other  ?  Spouse name: Natalie Beck [facility])  ? Number of children: 1  ? Years of education: Not on file  ? Highest education level: Not on file  ?Occupational History  ? Not on file  ?Tobacco Use  ? Smoking status: Former  ?  Packs/day: 0.50  ?  Types: Cigarettes  ?  Quit date: 01/23/2017  ?  Years since quitting: 4.4  ? Smokeless tobacco: Never  ? Tobacco comments:  ?  Already quit  ?Vaping Use  ? Vaping Use: Former  ?Substance and Sexual Activity  ? Alcohol use: Not Currently  ?  Alcohol/week: 2.0 standard drinks  ?  Types: 2 Standard drinks or equivalent per week  ? Drug use: No  ? Sexual activity: Not Currently  ?  Partners: Male  ?  Birth control/protection: None  ?Other Topics Concern  ? Not on file  ?Social History Narrative  ? Not on file  ? ?Social Determinants of Health  ? ?Financial Resource Strain: Not on file  ?Food Insecurity: Not on file  ?Transportation Needs: Not on file  ?Physical Activity: Not on file  ?Stress: Not on file  ?Social Connections: Not on file  ? ?SDOH:  ?SDOH Screenings  ? ?Alcohol Screen: Not on file  ?Depression (PHQ2-9): Not on file  ?Financial Resource Strain: Not on file  ?Food Insecurity: Not on file  ?Housing: Not on file  ?Physical Activity: Not on file  ?Social Connections: Not on file  ?Stress: Not on file  ?Tobacco Use: Medium Risk  ? Smoking Tobacco Use: Former  ? Smokeless Tobacco Use: Never  ? Passive Exposure: Not on file  ?Transportation Needs: Not on file  ? ? ?Tobacco Cessation:  Prescription not provided because: declined. ? ?Current Medications:  ?Current Facility-Administered Medications  ?Medication Dose Route Frequency Provider Last Rate Last Admin  ? acetaminophen (TYLENOL) tablet 650 mg  650 mg Oral Q6H PRN Rozetta Nunnery, NP      ? alum & mag hydroxide-simeth (MAALOX/MYLANTA) 200-200-20 MG/5ML suspension 30 mL  30 mL Oral Q4H PRN Lindon Romp A, NP      ? FLUoxetine (PROZAC) capsule 10 mg  10 mg  Oral Daily Lindon Romp A, NP   10 mg at 07/12/21 [id number]  ? hydrOXYzine (ATARAX) tablet 25 mg  25 mg Oral TID PRN Rozetta Nunnery, NP   25 mg at 07/11/21 2343  ? magnesium hydroxide (MILK OF MAGNESIA) suspension 30 mL  30 mL Oral Daily PRN Lindon Romp A, NP      ? traZODone (DESYREL) tablet 50 mg  50 mg Oral QHS PRN Rozetta Nunnery, NP   50 mg at 07/11/21 2343  ? ?Current Outpatient Medications  ?Medication Sig Dispense Refill  ? albuterol (PROVENTIL HFA;VENTOLIN HFA) 108 (90 Base) MCG/ACT inhaler Inhale 2 puffs into the lungs every 6 (six) hours as needed for wheezing or shortness of breath.    ? loratadine-pseudoephedrine (CLARITIN-D 24 HOUR) 10-240 MG 24 hr tablet Take 1 tablet by mouth daily as needed for allergies.    ? [START  ON 07/13/2021] FLUoxetine (PROZAC) 10 MG capsule Take 1 capsule (10 mg total) by mouth daily.  0  ? ? ?PTA Medications: (Not in a hospital admission) ? ? ?Musculoskeletal  ?Strength & Muscle Tone: within normal limits ?Gait & Station: normal ?Patient leans: N/A ? ?Psychiatric Specialty Exam  ?Presentation  ?General Appearance: Appropriate for Environment ? ?Eye Contact:Fair ? ?Speech:Clear and Coherent ? ?Speech Volume:Normal ? ?Handedness:No data recorded ? ?Mood and Affect  ?Mood:Depressed ? ?Affect:Congruent ? ? ?Thought Process  ?Thought Processes:Coherent; Goal Directed ? ?Descriptions of Associations:Intact ? ?Orientation:Full (Time, Place and Person) ? ?Thought Content:Logical ? Diagnosis of Schizophrenia or Schizoaffective disorder in past: No ?  ? Hallucinations:Hallucinations: None ? ?Ideas of Reference:None ? ?Suicidal Thoughts:Suicidal Thoughts: Yes, Passive ? ?Homicidal Thoughts:Homicidal Thoughts: No ? ? ?Sensorium  ?Memory:Immediate Fair; Remote Fair; Recent Fair ? ?Judgment:Fair ? ?Insight:Fair ? ? ?Executive Functions  ?Concentration:Fair ? ?Attention Span:Fair ? ?Recall:Fair ? ?[place] ? ?Language:Fair ? ? ?Psychomotor Activity  ?Psychomotor  Activity:Psychomotor Activity: Normal ? ? ?Assets  ?Assets:Communication Skills; Desire for Improvement; Financial Resources/Insurance; Housing; Intimacy; Leisure Time; Physical Health; Social Support; Transportation ? ? ?Sl

## 2021-07-12 NOTE — ED Notes (Signed)
Pt A&O x 4, presents with passive suicidal ideations, no plan noted.  Pt states SI thoughts are more intrusive and aggressive.  Pt reports her hope is to prevent a SI attempt.  Pt calm & cooperative at present, guarded .  Denies HI or AVH.  Monitoring for safety. ?

## 2021-07-13 LAB — URINALYSIS, ROUTINE W REFLEX MICROSCOPIC
Bilirubin Urine: NEGATIVE
Glucose, UA: NEGATIVE mg/dL
Hgb urine dipstick: NEGATIVE
Ketones, ur: NEGATIVE mg/dL
Leukocytes,Ua: NEGATIVE
Nitrite: NEGATIVE
Protein, ur: NEGATIVE mg/dL
Specific Gravity, Urine: 1.024 (ref 1.005–1.030)
pH: 7 (ref 5.0–8.0)

## 2021-07-13 LAB — VITAMIN B12: Vitamin B-12: 356 pg/mL (ref 180–914)

## 2021-07-13 LAB — VITAMIN D 25 HYDROXY (VIT D DEFICIENCY, FRACTURES): Vit D, 25-Hydroxy: 18.65 ng/mL — ABNORMAL LOW (ref 30–100)

## 2021-07-13 MED ORDER — AMPHETAMINE-DEXTROAMPHETAMINE 10 MG PO TABS
10.0000 mg | ORAL_TABLET | Freq: Every day | ORAL | Status: DC
  Administered 2021-07-14 – 2021-07-15 (×2): 10 mg via ORAL
  Filled 2021-07-13 (×2): qty 1

## 2021-07-13 MED ORDER — AMPHETAMINE-DEXTROAMPHETAMINE 10 MG PO TABS
5.0000 mg | ORAL_TABLET | Freq: Every day | ORAL | Status: DC
  Administered 2021-07-13: 5 mg via ORAL
  Filled 2021-07-13: qty 1

## 2021-07-13 NOTE — Progress Notes (Signed)
?   07/13/21 2000  ?Psych Admission Type (Psych Patients Only)  ?Admission Status Voluntary  ?Psychosocial Assessment  ?Patient Complaints Depression  ?Eye Contact Fair  ?Facial Expression Sad  ?Affect Anxious  ?Speech Logical/coherent  ?Interaction Assertive  ?Motor Activity Slow  ?Appearance/Hygiene Unremarkable  ?Behavior Characteristics Cooperative  ?Mood Anxious  ?Aggressive Behavior  ?Effect No apparent injury  ?Thought Process  ?Coherency WDL  ?Content WDL  ?Delusions WDL  ?Perception WDL  ?Hallucination None reported or observed  ?Judgment WDL  ?Confusion None  ?Danger to Self  ?Current suicidal ideation? Denies  ?Danger to Others  ?Danger to Others None reported or observed  ? ? ?

## 2021-07-13 NOTE — Progress Notes (Signed)
[facility] MD Progress Note ? ?07/13/2021 2:14 PM ?Natalie Beck  ?MRN:  [id number] ? ?Subjective:   ?Natalie Beck is a 31 y.o [place] American female who presented to the [employer] health urgent care [facility]) with worsening depression and suicidal thoughts in the setting of feeling overwhelmed taking care of her two young children, one of them with a complex medical condition.  She reported a prior mental health diagnosis of depression by her primary care provider, and denies having a mental health provider. This is her first inpatient mental health hospitalization.  She was transferred to this [facility] for management of her depressive symptoms. ? ?Yesterday the psychiatry team made the following recommendations: ?-Increase prozac to 20 mg once daily ?-Start gabapentin 100 mg tid for anxiety ? ?On my assessment today, the patient reports that mood remains down and depressed.  She reports that sleep was off and on last night, perhaps some better in the duration and quality of sleep she gets at home (she cares for her disabled 39-month-old child at home). ?She reports that appetite is better.  She reports that anxiety is somewhat less due to ruminating less.  She reports feeling less worried about day-to-day stressors that bothered her at home.  But she is starting to worry more about not being at home to care for her children (she reports that fianc? is at home currently caring for their 2 children). ?She denies having side effects to increasing fluoxetine or starting gabapentin.  She denies having suicidal thoughts.  She denies having homicidal thoughts. ?We discussed starting Adderall for treatment of ADHD.  Patient is agreeable.  She denies a history of any cardiac disease.  She denies a family history of sudden cardiac death.  She denies previous trials of stimulant medication.  She reports brother also is diagnosed with ADHD, is taking medication but is unsure the  medication that he is taking. ?After the interview, patient signed a 72-hour form.  At this time, I will not file for IVC. ?I reviewed the EKG prior to administering Adderall this morning. ? ? ?Principal Problem: MDD (major depressive disorder), recurrent severe, without psychosis ([place]) ?Diagnosis: Principal Problem: ?  MDD (major depressive disorder), recurrent severe, without psychosis ([place]) ?Active Problems: ?  MDD (major depressive disorder), recurrent episode, severe ([place]) ?  Attention deficit hyperactivity disorder (ADHD) ?  GAD (generalized anxiety disorder) ?  PTSD (post-traumatic stress disorder) ? ?Total Time spent with patient: 20 minutes ? ?Past Psychiatric History: MDD, treated by PCP.  Denies history of suicide attempt.  Denies history of psychiatric hospitalization.  Has been treated on Zoloft in the past, poor adherence with this medication, with unknown efficacy due to poor adherence. ? ?Past Medical History:  ?Past Medical History:  ?Diagnosis Date  ? Anemia   ? Asthma   ?  ?Past Surgical History:  ?Procedure Laterality Date  ? CESAREAN SECTION N/A 08/17/2017  ? Procedure: CESAREAN SECTION;  Surgeon: Harlin Heys, MD;  Location: [facility] ORS;  Service: Obstetrics;  Laterality: N/A;  ? CESAREAN SECTION  01/25/2020  ? Procedure: CESAREAN SECTION;  Surgeon: Rubie Maid, MD;  Location: [facility] ORS;  Service: Obstetrics;;  ? dental implant    ? WISDOM TOOTH EXTRACTION    ? ?Family History:  ?Family History  ?Problem Relation Age of Onset  ? Asthma Father   ? Asthma Sister   ? Hypertension Sister   ? Rheum arthritis Paternal Grandmother   ? Cancer Paternal Grandmother   ?  lung  ? Cirrhosis Maternal Grandmother   ? Cancer Other   ?     maternal great grandmother-leukemia  ? Cancer Paternal Grandfather   ? Breast cancer Neg Hx   ? Ovarian cancer Neg Hx   ? ?Family Psychiatric  History: Mother, depression and bulimia.  Brother diagnosed with ADHD.  Denies family history of suicide attempts. ? ?Social  History:  ?Social History  ? ?Substance and Sexual Activity  ?Alcohol Use Not Currently  ? Alcohol/week: 2.0 standard drinks  ? Types: 2 Standard drinks or equivalent per week  ?   ?Social History  ? ?Substance and Sexual Activity  ?Drug Use No  ?  ?Social History  ? ?Socioeconomic History  ? Marital status: Significant Other  ?  Spouse name: Lysbeth Galas [facility])  ? Number of children: 1  ? Years of education: Not on file  ? Highest education level: Not on file  ?Occupational History  ? Not on file  ?Tobacco Use  ? Smoking status: Former  ?  Packs/day: 0.50  ?  Types: Cigarettes  ?  Quit date: 01/23/2017  ?  Years since quitting: 4.4  ? Smokeless tobacco: Never  ? Tobacco comments:  ?  Already quit  ?Vaping Use  ? Vaping Use: Former  ?Substance and Sexual Activity  ? Alcohol use: Not Currently  ?  Alcohol/week: 2.0 standard drinks  ?  Types: 2 Standard drinks or equivalent per week  ? Drug use: No  ? Sexual activity: Not Currently  ?  Partners: Male  ?  Birth control/protection: None  ?Other Topics Concern  ? Not on file  ?Social History Narrative  ? Not on file  ? ?Social Determinants of Health  ? ?Financial Resource Strain: Not on file  ?Food Insecurity: Not on file  ?Transportation Needs: Not on file  ?Physical Activity: Not on file  ?Stress: Not on file  ?Social Connections: Not on file  ? ?Additional Social History:  ?  ?  ?  ?  ?  ?  ?  ?  ?  ?  ?  ? ?Sleep: Fair ? ?Appetite:  Good ? ?Current Medications: ?Current Facility-Administered Medications  ?Medication Dose Route Frequency Provider Last Rate Last Admin  ? acetaminophen (TYLENOL) tablet 650 mg  650 mg Oral Q6H PRN Prescilla Sours, PA-C      ? alum & mag hydroxide-simeth (MAALOX/MYLANTA) 200-200-20 MG/5ML suspension 30 mL  30 mL Oral Q4H PRN Margorie John W, PA-C      ? amphetamine-dextroamphetamine (ADDERALL) tablet 5 mg  5 mg Oral Q breakfast Prescious Hurless, Ovid Curd, MD   5 mg at 07/13/21 [id number]  ? FLUoxetine (PROZAC) capsule 20 mg  20 mg Oral Daily Shylie Polo,  Nema Oatley, MD   20 mg at 07/13/21 [id number]  ? gabapentin (NEURONTIN) capsule 100 mg  100 mg Oral TID Janine Limbo, MD   100 mg at 07/13/21 1122  ? hydrOXYzine (ATARAX) tablet 25 mg  25 mg Oral TID PRN Prescilla Sours, PA-C   25 mg at 07/12/21 2141  ? magnesium hydroxide (MILK OF MAGNESIA) suspension 30 mL  30 mL Oral Daily PRN Prescilla Sours, PA-C      ? nicotine (NICODERM CQ - dosed in mg/24 hours) patch 14 mg  14 mg Transdermal Daily Alphonsine Minium, MD      ? traZODone (DESYREL) tablet 50 mg  50 mg Oral QHS PRN Prescilla Sours, PA-C   50 mg at 07/12/21 2141  ? ? ?Lab Results:  ?Results  for orders placed or performed during the hospital encounter of 07/12/21 (from the past 48 hour(s))  ?Urinalysis, Routine w reflex microscopic Urine, Clean Catch     Status: None  ? Collection Time: 07/12/21 10:18 PM  ?Result Value Ref Range  ? Color, Urine YELLOW YELLOW  ? APPearance CLEAR CLEAR  ? Specific Gravity, Urine 1.024 1.005 - 1.030  ? pH 7.0 5.0 - 8.0  ? Glucose, UA NEGATIVE NEGATIVE mg/dL  ? Hgb urine dipstick NEGATIVE NEGATIVE  ? Bilirubin Urine NEGATIVE NEGATIVE  ? Ketones, ur NEGATIVE NEGATIVE mg/dL  ? Protein, ur NEGATIVE NEGATIVE mg/dL  ? Nitrite NEGATIVE NEGATIVE  ? Leukocytes,Ua NEGATIVE NEGATIVE  ?  Comment: Performed at [facility], [place] [address] [place], [place] [address]  ?VITAMIN D 25 Hydroxy (Vit-D Deficiency, Fractures)     Status: Abnormal  ? Collection Time: 07/13/21  6:30 AM  ?Result Value Ref Range  ? Vit D, 25-Hydroxy 18.65 (L) 30 - 100 ng/mL  ?  Comment: (NOTE) ?Vitamin D deficiency has been defined by the Institute of Medicine  ?and an Endocrine Society practice guideline as a level of serum 25-OH  ?vitamin D less than 20 ng/mL (1,2). The Endocrine Society went on to  ?further define vitamin D insufficiency as a level between 21 and 29  ?ng/mL (2). ? ?1. IOM [employer] of Medicine). 2010. Dietary reference intakes for  ?calcium and D. [place]: [employer]. ?2. Holick MF, Binkley [place], Bischoff-Ferrari HA, et al. Evaluation,  ?treatment, and prevention of vitamin D deficiency: an Endocrine  ?Society clinical practice guideline, JCEM. 2011 Jul; 96(7): 1911-30. ? ?Perfor

## 2021-07-13 NOTE — Plan of Care (Signed)
  Problem: Education: Goal: Emotional status will improve Outcome: Not Progressing Goal: Mental status will improve Outcome: Not Progressing Goal: Verbalization of understanding the information provided will improve Outcome: Not Progressing   

## 2021-07-13 NOTE — Progress Notes (Signed)
?   07/13/21 1200  ?Psych Admission Type (Psych Patients Only)  ?Admission Status Voluntary  ?Psychosocial Assessment  ?Patient Complaints Depression  ?Eye Contact Brief  ?Facial Expression Sad  ?Affect Appropriate to circumstance  ?Speech Logical/coherent  ?Interaction Assertive  ?Motor Activity Slow  ?Appearance/Hygiene Unremarkable  ?Behavior Characteristics Cooperative  ?Mood Anxious  ?Thought Process  ?Coherency WDL  ?Content WDL  ?Delusions None reported or observed  ?Perception WDL  ?Hallucination None reported or observed  ?Judgment WDL  ?Confusion None  ?Danger to Self  ?Current suicidal ideation? Denies  ?Danger to Others  ?Danger to Others None reported or observed  ? ? ?

## 2021-07-13 NOTE — Progress Notes (Signed)
Adult Psychoeducational Group Note ? ?Date:  07/13/2021 ?Time:  10:20 PM ? ?Group Topic/Focus:  ?Wrap-Up Group:   The focus of this group is to help patients review their daily goal of treatment and discuss progress on daily workbooks. ? ?Participation Level:  Active ? ?Participation Quality:  Appropriate ? ?Affect:  Appropriate ? ?Cognitive:  Appropriate ? ?Insight: Appropriate ? ?Engagement in Group:  Engaged ? ?Modes of Intervention:  Discussion ? ?Additional Comments:  Patient attend wrap up group. ? ?Charna Busman Long ?07/13/2021, 10:20 PM ?

## 2021-07-13 NOTE — Progress Notes (Signed)
?  On assessment, pt presents with moderate anxiety and depression.  Pt is tearful stating, "I have never been away from my kids."  Pt reports her baby is sick and at home with her 31 year old and father. ? ?Administered PRN Hydroxyzine and Trazodone per [facility] per pt request. ? ?Pt is safe on the unit with Q 15-minute safety checks. ? ? ? ? 07/12/21 2141  ?Psych Admission Type (Psych Patients Only)  ?Admission Status Voluntary  ?Psychosocial Assessment  ?Patient Complaints Depression;Sadness;Anxiety  ?Eye Contact Brief  ?Facial Expression Flat  ?Affect Apprehensive  ?Speech Logical/coherent  ?Interaction Forwards little  ?Motor Activity Slow  ?Appearance/Hygiene Unremarkable  ?Behavior Characteristics Cooperative  ?Mood Depressed;Anxious  ?Thought Process  ?Coherency WDL  ?Content WDL  ?Delusions None reported or observed  ?Perception WDL  ?Hallucination None reported or observed  ?Judgment Impaired  ?Confusion None  ?Danger to Self  ?Current suicidal ideation? Denies  ?Self-Injurious Behavior No self-injurious ideation or behavior indicators observed or expressed   ?Agreement Not to Harm Self Yes  ?Description of Agreement Verbal contract for safety  ?Danger to Others  ?Danger to Others None reported or observed  ? ? ?

## 2021-07-13 NOTE — Group Note (Signed)
LCSW Group Therapy Note ? ? ?Group Date: 07/13/2021 ?Start Time: 1300 ?End Time: 1400 ? ? ?Type of Therapy and Topic: Group Therapy: Worry and Anxiety ? ?Participation Level: [facility] PARTICIPATION LEVEL: Active ? ?Description of Group: ?In this group, patients will be encouraged to explore their worry around what could happen vs what will happen. Each patient will be challenged to think of personal worries and how they will work their way through that worry around what will happen and what could happen. This group will be process-oriented, with patients participating in exploration of their own experiences as well as giving and receiving support and challenge from other group members. ? ?Therapeutic Goals: ?Patient will identify personal worries that cause anxiety. ?Patient will identify clues to identify their worry. ?Patient will identify ways to handle their worry. ?Patient will discuss ways that their worry has deceased or why it has not decreased.  ? ?Summary of Patient Progress: Patient was provided with packet for group and given the opportunity to speak with CSW one on one around topic.  ? ? ?Therapeutic Modalities: ? ?Cognitive Behavioral Therapy ?Solution Focused Therapy ?Motivational Interviewing ? ? ?Darletta Moll MSW, LCSW ?Clincal Social Worker  ?[facility]  ?

## 2021-07-13 NOTE — Progress Notes (Signed)
I evaluated the pt in the afternoon around 2:45pm, and she reports no noticeable effect since starting adderall 5 mg at 10am. She is calm, resting in bed. She also denies any side effects, including irritability, worsening anxiety, loss of appetite, inability to sleep or chest pain.  ? ?Pt agreeable to increasing adderall dose to 10 mg once daily, starting tomorrow 07-14-21.  ? ?Phineas Inches, MD ?Psychiatrist  ? ?

## 2021-07-14 MED ORDER — HYDROCERIN EX CREA
TOPICAL_CREAM | Freq: Two times a day (BID) | CUTANEOUS | Status: DC
  Filled 2021-07-14: qty 113

## 2021-07-14 NOTE — Group Note (Signed)
LCSW Group Therapy Note ? ?Group Date: 07/14/2021 ?Start Time: 1000 ?End Time: 1100 ? ? ?Type of Therapy and Topic:  Group Therapy - Healthy vs Unhealthy Coping Skills ? ?Participation Level:  Active  ? ?Description of Group ?The focus of this group was to determine what unhealthy coping techniques typically are used by group members and what healthy coping techniques would be helpful in coping with various problems. Patients were guided in becoming aware of the differences between healthy and unhealthy coping techniques. Patients were asked to identify 2-3 healthy coping skills they would like to learn to use more effectively. ? ?Therapeutic Goals ?Patients learned that coping is what human beings do all day long to deal with various situations in their lives ?Patients defined and discussed healthy vs unhealthy coping techniques ?Patients identified their preferred coping techniques and identified whether these were healthy or unhealthy ?Patients determined 2-3 healthy coping skills they would like to become more familiar with and use more often. ?Patients provided support and ideas to each other ? ? ?Summary of Patient Progress:  During group, Katelyne expressed that she was suicidal so she came to the hospital.  She stated this was attributable mostly to feeling overwhelmed by caring for a toddler and a child with a lot of medical issues.  She is glad she will know about more resources when she leaves but does not think that this level of care is meeting her expectations.  Patient proved open to input from peers and feedback from CSW. Patient demonstrated good insight into the subject matter, was respectful of peers, and participated throughout the entire session. ? ? ?Therapeutic Modalities ?Cognitive Behavioral Therapy ?Motivational Interviewing ? ?Lynnell Chad, LCSWA ?07/14/2021  2:00 PM   ?

## 2021-07-14 NOTE — Progress Notes (Signed)
[facility] MD Progress Note ? ?07/14/2021 4:27 PM ?Natalie Beck  ?MRN:  [id number] ? ?Subjective: "I am still depressed and missing my father and hated how he died, and my life situation with my 61 month's old brain-challenged child." ? ?Brief History:  Natalie Beck is a 31 y.o [place] American female who presented to the [employer] health urgent care [facility]) with worsening depression and suicidal thoughts in the setting of feeling overwhelmed taking care of her two young children, one of them with a complex medical condition.  She reported a prior mental health diagnosis of depression by her primary care provider, and denies having a mental health provider. This is her first inpatient mental health hospitalization.  She was transferred to this [facility] for management of her depressive symptoms. ? ?Daily Notes 07/14/21: Patient was examined in the office, sitting on the chair. Chart is reviewed and findings shared with the treatment team and Dr. Loleta Chance. On encounter, patient is alert, and oriented to person, place, time and situation. Able to maintain good eye contact and responded appropriately to questions. Speech is clear, coherent and with normal pattern and volume. Mood and affect is anxious, depressed, tearful and congruent.Thought process is congruent and linear. Thought content is logical and within normal limit. Has normal psychoactive activity. Abnormal labs from the 07/11/21 include CMP: glucose 129; Lipid Profile: HDL 39, LDL 119, Triglyceride 177; Vitamin D, 25-Hydroxy 18.65; CBC: HgB 9.7, HCT 31.7, MCH 24.6, RDW 16.2; Urine TOX positive for Marijuana. ? ?Patient reported good appetite and consuming up to 100% of her meals. Endorsed sleeping 8 hours last night. Denied suicidal ideation, homicidal ideation, auditory or visual hallucination. Rated anxiety as "2" and depression as "2" on a scale of 0 to 10. Stated she is compliant with her medications and have no side  effects. Expressed that she needs to learn from group therapy some resources to help cope with her grief process. ?  ?Principal Problem: MDD (major depressive disorder), recurrent severe, without psychosis ([facility]) ? ?Diagnosis: Principal Problem: ?  MDD (major depressive disorder), recurrent severe, without psychosis ([facility]) ?Active Problems: ?  MDD (major depressive disorder), recurrent episode, severe ([facility]) ?  Attention deficit hyperactivity disorder (ADHD) ?  GAD (generalized anxiety disorder) ?  PTSD (post-traumatic stress disorder) ? ?Total Time spent with patient: 20 minutes ? ?Past Psychiatric History: Patient was diagnosed with psychiatric problem of depression by her Primary Care Physician in the past. ? ?Past Medical History:  ?Past Medical History:  ?Diagnosis Date  ? Anemia   ? Asthma   ?  ?Past Surgical History:  ?Procedure Laterality Date  ? CESAREAN SECTION N/A 08/17/2017  ? Procedure: CESAREAN SECTION;  Surgeon: Linzie Collin, MD;  Location: [facility] ORS;  Service: Obstetrics;  Laterality: N/A;  ? CESAREAN SECTION  01/25/2020  ? Procedure: CESAREAN SECTION;  Surgeon: Hildred Laser, MD;  Location: [facility] ORS;  Service: Obstetrics;;  ? dental implant    ? WISDOM TOOTH EXTRACTION    ? ?Family History:  ?Family History  ?Problem Relation Age of Onset  ? Asthma Father   ? Asthma Sister   ? Hypertension Sister   ? Rheum arthritis Paternal Grandmother   ? Cancer Paternal Grandmother   ?     lung  ? Cirrhosis Maternal Grandmother   ? Cancer Other   ?     maternal great grandmother-leukemia  ? Cancer Paternal Grandfather   ? Breast cancer Neg Hx   ? Ovarian cancer Neg  Hx   ? ?Family Psychiatric  History: Pt reports that she was born and raised in [place], [place], her mother currently lives in [place], and is currently supportive to her, but has not been in the past. She reports that she has a younger brother who is also supportive. She reports that she lives with her boyfriend in a trailer home with their two young  children (41 months old & 3 yrs old) ? ?Social History:  ?Social History  ? ?Substance and Sexual Activity  ?Alcohol Use Not Currently  ? Alcohol/week: 2.0 standard drinks  ? Types: 2 Standard drinks or equivalent per week  ?   ?Social History  ? ?Substance and Sexual Activity  ?Drug Use No  ?  ?Social History  ? ?Socioeconomic History  ? Marital status: Significant Other  ?  Spouse name: Natalie Beck [facility])  ? Number of children: 1  ? Years of education: Not on file  ? Highest education level: Not on file  ?Occupational History  ? Not on file  ?Tobacco Use  ? Smoking status: Former  ?  Packs/day: 0.50  ?  Types: Cigarettes  ?  Quit date: 01/23/2017  ?  Years since quitting: 4.4  ? Smokeless tobacco: Never  ? Tobacco comments:  ?  Already quit  ?Vaping Use  ? Vaping Use: Former  ?Substance and Sexual Activity  ? Alcohol use: Not Currently  ?  Alcohol/week: 2.0 standard drinks  ?  Types: 2 Standard drinks or equivalent per week  ? Drug use: No  ? Sexual activity: Not Currently  ?  Partners: Male  ?  Birth control/protection: None  ?Other Topics Concern  ? Not on file  ?Social History Narrative  ? Not on file  ? ?Social Determinants of Health  ? ?Financial Resource Strain: Not on file  ?Food Insecurity: Not on file  ?Transportation Needs: Not on file  ?Physical Activity: Not on file  ?Stress: Not on file  ?Social Connections: Not on file  ? ?Additional Social History:  ?  ?Sleep: Good ? ?Appetite:  Good ? ?Current Medications: ?Current Facility-Administered Medications  ?Medication Dose Route Frequency Provider Last Rate Last Admin  ? acetaminophen (TYLENOL) tablet 650 mg  650 mg Oral Q6H PRN Jaclyn Shaggy, PA-C      ? alum & mag hydroxide-simeth (MAALOX/MYLANTA) 200-200-20 MG/5ML suspension 30 mL  30 mL Oral Q4H PRN Melbourne Abts W, PA-C      ? amphetamine-dextroamphetamine (ADDERALL) tablet 10 mg  10 mg Oral Q breakfast Massengill, Harrold Donath, MD   10 mg at 07/14/21 0747  ? FLUoxetine (PROZAC) capsule 20 mg  20 mg Oral  Daily Massengill, Nathan, MD   20 mg at 07/14/21 0747  ? gabapentin (NEURONTIN) capsule 100 mg  100 mg Oral TID Phineas Inches, MD   100 mg at 07/14/21 1611  ? hydrocerin (EUCERIN) cream   Topical BID Armandina Stammer I, NP   Given at 07/14/21 1611  ? hydrOXYzine (ATARAX) tablet 25 mg  25 mg Oral TID PRN Jaclyn Shaggy, PA-C   25 mg at 07/13/21 2114  ? magnesium hydroxide (MILK OF MAGNESIA) suspension 30 mL  30 mL Oral Daily PRN Jaclyn Shaggy, PA-C      ? nicotine (NICODERM CQ - dosed in mg/24 hours) patch 14 mg  14 mg Transdermal Daily Massengill, Nathan, MD      ? traZODone (DESYREL) tablet 50 mg  50 mg Oral QHS PRN Melbourne Abts W, PA-C   50 mg at  07/13/21 2114  ? ? ?Lab Results:  ?Results for orders placed or performed during the hospital encounter of 07/12/21 (from the past 48 hour(s))  ?Urinalysis, Routine w reflex microscopic Urine, Clean Catch     Status: None  ? Collection Time: 07/12/21 10:18 PM  ?Result Value Ref Range  ? Color, Urine YELLOW YELLOW  ? APPearance CLEAR CLEAR  ? Specific Gravity, Urine 1.024 1.005 - 1.030  ? pH 7.0 5.0 - 8.0  ? Glucose, UA NEGATIVE NEGATIVE mg/dL  ? Hgb urine dipstick NEGATIVE NEGATIVE  ? Bilirubin Urine NEGATIVE NEGATIVE  ? Ketones, ur NEGATIVE NEGATIVE mg/dL  ? Protein, ur NEGATIVE NEGATIVE mg/dL  ? Nitrite NEGATIVE NEGATIVE  ? Leukocytes,Ua NEGATIVE NEGATIVE  ?  Comment: Performed at [facility], [address] [place], [place] [id number]  ?VITAMIN D 25 Hydroxy (Vit-D Deficiency, Fractures)     Status: Abnormal  ? Collection Time: 07/13/21  6:30 AM  ?Result Value Ref Range  ? Vit D, 25-Hydroxy 18.65 (L) 30 - 100 ng/mL  ?  Comment: (NOTE) ?Vitamin D deficiency has been defined by the Institute of Medicine  ?and an Endocrine Society practice guideline as a level of serum 25-OH  ?vitamin D less than 20 ng/mL (1,2). The Endocrine Society went on to  ?further define vitamin D insufficiency as a level between 21 and 29  ?ng/mL (2). ? ?1. IOM [employer](Institute of  Medicine). 2010. Dietary reference intakes for  ?calcium and D. [place]: [employer]. ?2. Holick MF, Binkley [place], Bischoff-Ferrari HA, et al. Evaluation,  ?treatment, and prevention of

## 2021-07-14 NOTE — Progress Notes (Signed)
[facility] Group Notes:  (Nursing/MHT/Case Management/Adjunct) ? ?Date:  07/14/2021  ?Time:  2015 ? ?Type of Therapy:   wrap up group ? ?Participation Level:  Active ? ?Participation Quality:  Appropriate, Attentive, Sharing, and Supportive ? ?Affect:  Appropriate ? ?Cognitive:  Alert ? ?Insight:  Improving ? ?Engagement in Group:  Engaged ? ?Modes of Intervention:  Clarification, Education, and Support ? ?Summary of Progress/Problems: Positive thinking and positive change were discussed. Pt is grateful for her two children and plans to hire someone to clean her house when she gets home.  ? ?Johann Capers S ?07/14/2021, 8:52 PM ?

## 2021-07-14 NOTE — Progress Notes (Signed)
Patient remains A&OX4, calm, linear thought pattern, clear speech, cooperative with medications and denies any S/E. Depressed mood and affect are congruent.  Pt memory appears to be grossly intact.  Pt denies ideations and hallucinations. No complaints of distress, pain and/or discomfort. No c/o insomnia or abnormalities with diet. No C/O nightmares. Pt ambulates well without assistance. Steady gait.  Psychomotor activity was WNL. No s/s of Parkinson, Dystonia, Akathisia and/or Tardive Dyskinesia noted. ?

## 2021-07-14 NOTE — Progress Notes (Signed)
?   07/14/21 0500  ?Sleep  ?Number of Hours 6.75  ? ? ?

## 2021-07-15 DIAGNOSIS — F332 Major depressive disorder, recurrent severe without psychotic features: Principal | ICD-10-CM

## 2021-07-15 MED ORDER — AMPHETAMINE-DEXTROAMPHETAMINE 10 MG PO TABS: 10.0000 mg | ORAL_TABLET | Freq: Every day | ORAL | 0 refills | Status: AC

## 2021-07-15 MED ORDER — GABAPENTIN 100 MG PO CAPS: 100.0000 mg | ORAL_CAPSULE | Freq: Three times a day (TID) | ORAL | 0 refills | Status: AC

## 2021-07-15 MED ORDER — TRAZODONE HCL 50 MG PO TABS: 50.0000 mg | ORAL_TABLET | Freq: Every evening | ORAL | 0 refills | Status: AC | PRN

## 2021-07-15 MED ORDER — HYDROXYZINE HCL 25 MG PO TABS: 25.0000 mg | ORAL_TABLET | Freq: Three times a day (TID) | ORAL | 0 refills | Status: AC | PRN

## 2021-07-15 MED ORDER — HYDROCERIN EX CREA: 1.0000 "application " | TOPICAL_CREAM | Freq: Two times a day (BID) | CUTANEOUS | 0 refills | Status: AC

## 2021-07-15 MED ORDER — NICOTINE 14 MG/24HR TD PT24: 14.0000 mg | MEDICATED_PATCH | Freq: Every day | TRANSDERMAL | 0 refills | Status: AC

## 2021-07-15 MED ORDER — FLUOXETINE HCL 20 MG PO CAPS: 20.0000 mg | ORAL_CAPSULE | Freq: Every day | ORAL | 0 refills | Status: AC

## 2021-07-15 NOTE — Discharge Instructions (Signed)
For individual and/or group grief counseling, please call: ? ?[employer] ?[address] ?[place], [place] [address] ?[phone] ?[email or web address] ? ?This is a free service. ?

## 2021-07-15 NOTE — Discharge Summary (Signed)
Physician Discharge Summary Note ? ?Patient:  Natalie Beck is an 31 y.o., female. ? ?MRN:  [id number] ? ?DOB:  09-Mar-1991 ? ?Patient phone:  [phone] (home)  ? ?Patient address:   ?[address] ?[place] [address],  ?Total Time spent with patient: 1 hour ? ?Date of Admission:  07/12/2021 ?Date of Discharge:   07/15/2021 ? ?Reason for Admission:  Suicidal thoughts ? ?Principal Problem: MDD (major depressive disorder), recurrent severe, without psychosis ([place]) ? ?Discharge Diagnoses: Principal Problem: ?  MDD (major depressive disorder), recurrent severe, without psychosis ([place]) ?Active Problems: ?  MDD (major depressive disorder), recurrent episode, severe ([place]) ?  Attention deficit hyperactivity disorder (ADHD) ?  GAD (generalized anxiety disorder) ?  PTSD (post-traumatic stress disorder) ? ?Past Psychiatric History: In September 2022, PCP diagnosed pt with depression and started pt on Zoloft 100 mg po daily ? ?Past Medical History:  ?Past Medical History:  ?Diagnosis Date  ? Anemia   ? Asthma   ?  ?Past Surgical History:  ?Procedure Laterality Date  ? CESAREAN SECTION N/A 08/17/2017  ? Procedure: CESAREAN SECTION;  Surgeon: Harlin Heys, MD;  Location: [facility] ORS;  Service: Obstetrics;  Laterality: N/A;  ? CESAREAN SECTION  01/25/2020  ? Procedure: CESAREAN SECTION;  Surgeon: Rubie Maid, MD;  Location: [facility] ORS;  Service: Obstetrics;;  ? dental implant    ? WISDOM TOOTH EXTRACTION    ? ?Family History:  ?Family History  ?Problem Relation Age of Onset  ? Asthma Father   ? Asthma Sister   ? Hypertension Sister   ? Rheum arthritis Paternal Grandmother   ? Cancer Paternal Grandmother   ?     lung  ? Cirrhosis Maternal Grandmother   ? Cancer Other   ?     maternal great grandmother-leukemia  ? Cancer Paternal Grandfather   ? Breast cancer Neg Hx   ? Ovarian cancer Neg Hx   ? ?Family Psychiatric  History:  Pt reports that she was born and raised in [place], [place], her mother currently lives in  [place], and is currently supportive to her, but has not been in the past. Mother has history of bulimia and depression. She reports that she has a younger brother who is also supportive. She reports that she lives with her boyfriend in a trailer home with their two young children (35 months old & 36 yrs old). ? ?Social History:  ?Social History  ? ?Substance and Sexual Activity  ?Alcohol Use Not Currently  ? Alcohol/week: 2.0 standard drinks  ? Types: 2 Standard drinks or equivalent per week  ?   ?Social History  ? ?Substance and Sexual Activity  ?Drug Use No  ?  ?Social History  ? ?Socioeconomic History  ? Marital status: Significant Other  ?  Spouse name: Lysbeth Galas [facility]  ? Number of children: 1  ? Years of education: Not on file  ? Highest education level: Not on file  ?Occupational History  ? Not on file  ?Tobacco Use  ? Smoking status: Former  ?  Packs/day: 0.50  ?  Types: Cigarettes  ?  Quit date: 01/23/2017  ?  Years since quitting: 4.4  ? Smokeless tobacco: Never  ? Tobacco comments:  ?  Already quit  ?Vaping Use  ? Vaping Use: Former  ?Substance and Sexual Activity  ? Alcohol use: Not Currently  ?  Alcohol/week: 2.0 standard drinks  ?  Types: 2 Standard drinks or equivalent per week  ? Drug use: No  ?  Sexual activity: Not Currently  ?  Partners: Male  ?  Birth control/protection: None  ?Other Topics Concern  ? Not on file  ?Social History Narrative  ? Not on file  ? ?Social Determinants of Health  ? ?Financial Resource Strain: Not on file  ?Food Insecurity: Not on file  ?Transportation Needs: Not on file  ?Physical Activity: Not on file  ?Stress: Not on file  ?Social Connections: Not on file  ? ? ?Hospital Course:  Hospital Course:   ?Patient was admitted to the Adult Unit at [facility] under the service of Dr. Mamie Levers ? Routine labs reviewed, no new labs. CBC with diff normal Hgb 9.7, HCT 31.7, MCH 24.6, RDW 16.2 CMP glucose 129 otherwise normal; TSH, HgbA1c: Normal; and Lipid  panel: HDL 39, LDLcalc 119, Triglyceride 177. Vitamin , 25-Hydroxy 18.65.  UDS positive for THC. Pregnancy negative.  ?Maintained Q 15 minutes observation for safety. During this admission patient did not require any change in her observation level and no PRN or time out  Was required. Length of stay was 4 days.  ?During this hospitalization the patient will receive psychosocial  assessment. ?Patient participated in  all forms of therapy including; group, milieu, and family therapy. Psychotherapy: Social and Airline pilot, anti-bullying, learning based strategies, cognitive behavioral, and family object relations individuation separation intervention psychotherapies can be considered.  ?During this admission patient met with her psychiatrist daily and received full nursing service. ?An individualized treatment plan according to the patient's age, level of functioning, diagnostic considerations and acute behavior was initiated.  ?To continue to reduce current symptoms to base-line and improve the patient's overall level of functioning continued on Prozac 20 mg po daily, Gabapentin 100 mg po TID, Hydroxyzine 25 mg po TID PRN, Nicotin Patch 14 mg Transdermal Patch every 24 hours, Trazodone 50 mg, daily at bedtime, Adderall 10 mg PO daily.  Patient tolerated medications well with no side effects at discharge. ?Patient was able to verbalize reasons for living and a safety plan was developed.  She appears to have a positive outlook and is agreeable to continued therapy and medication management. Safety plan was discussed with guardian who is in agreement. Patient was provided with the national suicide hotline # 1-800-273-TALK as well as [facility]  number. ?During the course of hospitalization medication changes consisted of increase Prozac from 10 mg to 20 mg po daily. ?At discharge, patient is medically stable and basic physical exam is within normal limits with no abnormal findings.    ?Patient appeared to benefit from the structure and consistency of the inpatient unit, continued current medication therapy, and integrated therapies. During the hospitalization the patient gradually improved and expressed no suicidal or homicidal thoughts, plan or intent to act on a plan. Her depressive symptoms and anxiety have appeared to subside and she has a positive outlook on her life going forward. She is able to express her feelings, and recognize when she is feeling overwhelmed. She has been calm and cooperative with no behavioral issues during this hospitalization.  ?A discharge conference was held with during which findings, recommendations, safety plans and aftercare plan were discussed with the caregivers. Please refer to the therapist note for further information about issues discussed on family session. ?At discharge patient denied, suicidal and homicidal ideation, intent or plan, and there was no evidence of manic or depressive symptoms. Patient is discharged home in stable condition.  ? ?Physical Findings: ?AIMS: Facial and Oral Movements ?Muscles of  Facial Expression: None, normal ?Lips and Perioral Area: None, normal ?Jaw: None, normal ?Tongue: None, normal,Extremity Movements ?Upper (arms, wrists, hands, fingers): None, normal ?Lower (legs, knees, ankles, toes): None, normal, Trunk Movements ?Neck, shoulders, hips: None, normal, Overall Severity ?Severity of abnormal movements (highest score from questions above): None, normal ?Incapacitation due to abnormal movements: None, normal ?Patient's awareness of abnormal movements (rate only patient's report): No Awareness, Dental Status ?Current problems with teeth and/or dentures?: No ?Does patient usually wear dentures?: No  ?CIWA:    ?COWS:    ? ?Musculoskeletal: ?Strength & Muscle Tone: within normal limits ? ?Gait & Station: normal ? ?Patient leans: N/A ? ?Psychiatric Specialty Exam: ? ?Presentation  ?General Appearance: Appropriate for  Environment; Casual; Fairly Groomed; Neat ? ?Eye Contact:Good ? ?Speech:Clear and Coherent; Normal Rate ? ?Speech Volume:Normal ? ?Handedness:Right ? ?Mood and Affect  ?Mood:Euthymic ? ?Affect:Appropriate; Full Range

## 2021-07-15 NOTE — Progress Notes (Signed)
?   07/14/21 2300  ?Psych Admission Type (Psych Patients Only)  ?Admission Status Voluntary  ?Psychosocial Assessment  ?Patient Complaints None  ?Eye Contact Fair  ?Facial Expression Flat  ?Affect Appropriate to circumstance  ?Speech Logical/coherent  ?Interaction Assertive  ?Motor Activity Other (Comment) ?(WNL)  ?Appearance/Hygiene Improved  ?Behavior Characteristics Cooperative;Appropriate to situation  ?Mood Pleasant  ?Thought Process  ?Coherency WDL  ?Content WDL  ?Delusions None reported or observed  ?Perception WDL  ?Hallucination None reported or observed  ?Judgment WDL  ?Confusion None  ?Danger to Self  ?Current suicidal ideation? Denies  ?Self-Injurious Behavior No self-injurious ideation or behavior indicators observed or expressed   ?Agreement Not to Harm Self Yes  ?Description of Agreement Verbal  ?Danger to Others  ?Danger to Others None reported or observed  ? ? ?

## 2021-07-15 NOTE — [facility] Group Notes (Signed)
.  Psychoeducational Group Note ? ? ? ?Date:07/15/21 ?Time: 1100-1200 ? ? ? ?Purpose of Group: . The group focus' on teaching patients on how to identify their needs and their Life Skills:  A group where two lists are made. What people need and what are things that we do that are unhealthy. The lists are developed by the patients and it is explained that we often do the actions that are not healthy to get our list of needs met. ? ?Goal:: to develop the coping skills needed to get their needs met ? ?Participation Level:  Active ? ?Participation Quality:  Appropriate ? ?Affect:  Appropriate ? ?Cognitive:  Oriented ? ?Insight:  Improving ? ?Engagement in Group:  Engaged ? ?Additional Comments: Rates her energy at a 8/10. Participated fully in the group. ? ?Natalie Beck A ? ?

## 2021-07-15 NOTE — Progress Notes (Signed)
Pt is A&OX4, calm, pleasant, denies suicidal ideations, homicidal ideations, auditory hallucinations and visual hallucinations. Pt admits sleeping well and denies experiencing nightmares. Mood and affect are congruent. Pt appetite is good. No complaints of distress, pain and/or discomfort at this time. Pt's memory appears to be grossly intact, and Pt doesn't display any injurious behaviors. Pt is medication compliant. There's no evidence of suicidal intent. Psychomotor activity was WNL. No s/s of Parkinson, Dystonia, Akathisia and/or Tardive Dyskinesia noted. Pt is looking forward to discharge home to her children. ?

## 2021-07-15 NOTE — Progress Notes (Signed)
?  [facility] Adult Case Management Discharge Plan : ? ?Will you be returning to the same living situation after discharge:  Yes,  family ?At discharge, do you have transportation home?: Yes,  family ?Do you have the ability to pay for your medications: Yes,  Medicaid ? ?Release of information consent forms completed and emailed to Medical Records, then turned in to Medical Records by CSW.  ? ?Patient to Follow up at: ? Follow-up Information   ? ? [facility] Go on 07/19/2021.   ?Why: You have a hospital follow up appointment for therapy and medication management services on 07/19/21 at 2:30 pm.  This appointment will be held in person. ?Contact information: ?[address] ?[address] ?[place] [address] ?[phone] ? ? ?  ?  ? ?  ?  ? ?  ? ? ?Next level of care provider has access to [facility] Link:no ? ?Safety Planning and Suicide Prevention discussed: Yes,  with fiance ? ?  ? ?Has patient been referred to the Quitline?: Patient refused referral ? ?Patient has been referred for addiction treatment: Yes ? ?Lynnell Chad, LCSW ?07/15/2021, 9:00 AM ?

## 2021-07-15 NOTE — Progress Notes (Signed)
Discharge Note by [facility]: ?All possessions/property returned to pt and signature obtained. Verbal and written education provided to pt regarding outpatient follow up care, treatment plan, and discharge medications (prescription & lotion). Pt verbalized comprehension and intent to comply. Pt continues to deny ideations and hallucinations. Pt denies any pain and/or discomfort. All precautions discontinued by physician. Pt remains calm, cooperative, med-compliant, ambulatory without assistance, and functioning at her optimal level.  ?
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
## 2021-07-15 NOTE — Group Note (Signed)
[place] LCSW Group Therapy Note ? ?Date/Time:  07/15/2021  10:00AM-11:00AM ? ?Type of Therapy and Topic:  Group Therapy:  Music's Effect on Depression and Anxiety ? ?Participation Level:  Did Not Attend  ? ?Description of Group: ?In this process group, members discussed what types of music trigger them to worsening mental health symptoms and what they can do about this in the future.  For instance, we discussed what to do when riding in a friend's car and a song harmful to the patient starts playing.  We also discussed how music can be used as a tool to help with wellness and recovery in various ways including managing depression and anxiety as well as encouraging healthy sleep habits and avoiding relapse into old negative behaviors such as drinking, self-harming, or staying in bed all day.  A variety of songs were played as examples.  Discussion was elicited which showed the group to be in agreement that the songs were quite relatable and have the potential to help them outside of the hospital setting. ? ?Therapeutic Goals: ?Patients will be introduced to a variety of songs that can relate to self-image and self-love in a helpful way. ?Patients will explore the impact of different pieces of music on their feelings, i.e. uplifting, triggering, etc. ?Patients will discuss how to use this self-knowledge to assist in recovery. ? ?Summary of Patient Progress:  The patient was invited to group, did not attend. ? ?Therapeutic Modalities: ?Solution Focused Brief Therapy ?Activity ? ? ?Selmer Dominion, LCSW ? ?  ?

## 2021-08-06 IMAGING — DX DG ABD PORTABLE 1V
1 series · 1 of 1 positions shown · non-contrast
Comparison: None.

CLINICAL DATA: Emergency Caesarean section, instrument count.

EXAM:
PORTABLE ABDOMEN - 1 VIEW

[abdomen supine]
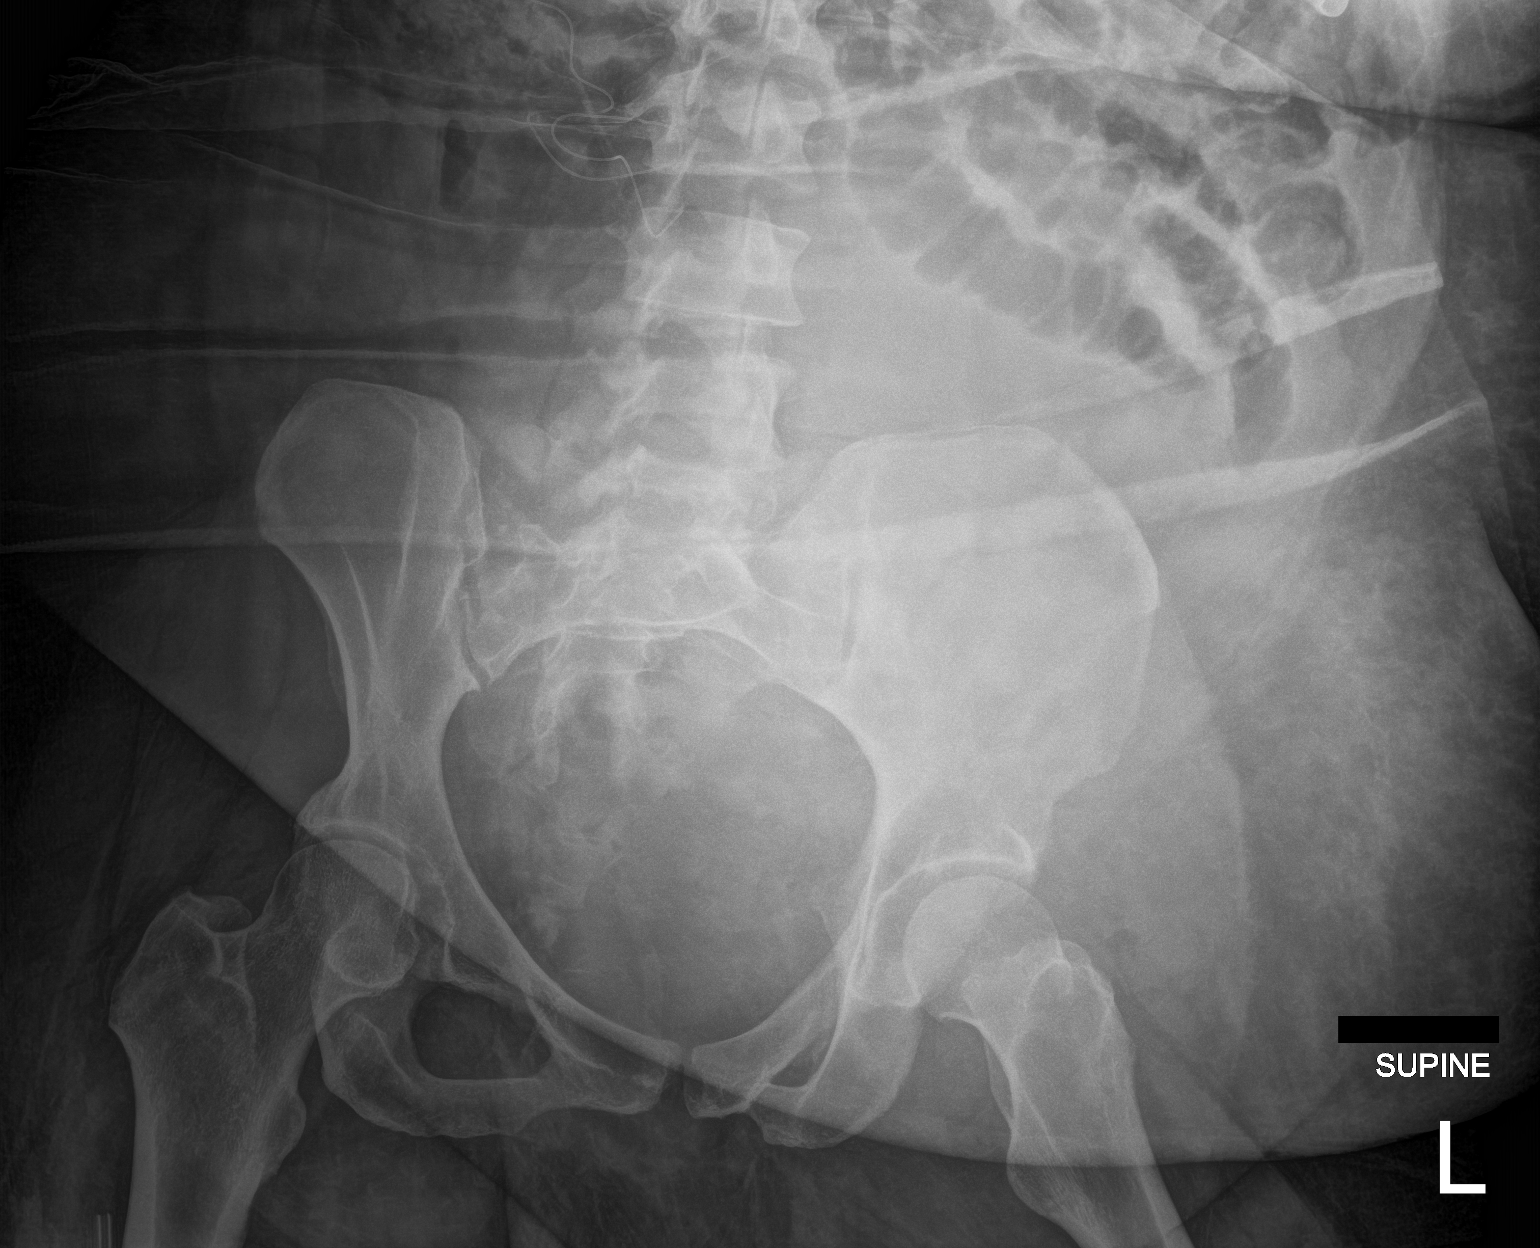

[1 of 1 positions shown; findings below may reference images not displayed]

FINDINGS: Epidural catheter in place. There is no evidence of retained
surgical instrument in the lower abdomen and pelvis. Bowel is
displaced in the upper abdomen due to recent pregnancy.
IMPRESSION: No evidence of retained surgical instrument in the lower abdomen and
pelvis.

Findings were called to the operating room, Dr Aviila, at the time
of the exam.

## 2021-08-11 ENCOUNTER — Telehealth (HOSPITAL_COMMUNITY): Payer: Self-pay | Admitting: *Deleted

## 2021-08-11 NOTE — BH Assessment (Signed)
Care Management - Follow Up [facility] Discharges  ? ?Patient has been placed in an inpatient psychiatric hospital [facility]) on 07-12-21 ?

## 2022-02-04 ENCOUNTER — Encounter: Payer: Self-pay | Admitting: Obstetrics and Gynecology

## 2022-02-04 ENCOUNTER — Encounter: Payer: Self-pay | Admitting: Certified Nurse Midwife
